# Patient Record
Sex: Female | Born: 1945 | Race: White | Hispanic: No | Marital: Married | Smoking: Former smoker
Health system: Southern US, Community
[De-identification: ages and names within clinical notes are randomized; demographics above are authoritative.]

## PROBLEM LIST (undated history)

## (undated) DIAGNOSIS — Z954 Presence of other heart-valve replacement: Secondary | ICD-10-CM

## (undated) DIAGNOSIS — I10 Essential (primary) hypertension: Secondary | ICD-10-CM

## (undated) DIAGNOSIS — M5430 Sciatica, unspecified side: Secondary | ICD-10-CM

## (undated) DIAGNOSIS — I4891 Unspecified atrial fibrillation: Secondary | ICD-10-CM

## (undated) DIAGNOSIS — R931 Abnormal findings on diagnostic imaging of heart and coronary circulation: Secondary | ICD-10-CM

## (undated) DIAGNOSIS — F329 Major depressive disorder, single episode, unspecified: Secondary | ICD-10-CM

## (undated) DIAGNOSIS — M549 Dorsalgia, unspecified: Secondary | ICD-10-CM

## (undated) DIAGNOSIS — E079 Disorder of thyroid, unspecified: Secondary | ICD-10-CM

## (undated) DIAGNOSIS — J45909 Unspecified asthma, uncomplicated: Secondary | ICD-10-CM

## (undated) DIAGNOSIS — J449 Chronic obstructive pulmonary disease, unspecified: Secondary | ICD-10-CM

## (undated) DIAGNOSIS — G8929 Other chronic pain: Secondary | ICD-10-CM

## (undated) DIAGNOSIS — I671 Cerebral aneurysm, nonruptured: Secondary | ICD-10-CM

## (undated) DIAGNOSIS — I639 Cerebral infarction, unspecified: Secondary | ICD-10-CM

## (undated) DIAGNOSIS — R42 Dizziness and giddiness: Secondary | ICD-10-CM

## (undated) DIAGNOSIS — E871 Hypo-osmolality and hyponatremia: Secondary | ICD-10-CM

## (undated) DIAGNOSIS — F32A Depression, unspecified: Secondary | ICD-10-CM

## (undated) HISTORY — PX: BRAIN SURGERY: SHX531

## (undated) HISTORY — PX: APPENDECTOMY: SHX54

## (undated) HISTORY — PX: CATARACT EXTRACTION: SUR2

## (undated) HISTORY — PX: CARDIAC SURGERY: SHX584

## (undated) HISTORY — DX: Depression, unspecified: F32.A

## (undated) HISTORY — DX: Major depressive disorder, single episode, unspecified: F32.9

## (undated) HISTORY — PX: CHOLECYSTECTOMY: SHX55

---

## 2013-10-20 DIAGNOSIS — I671 Cerebral aneurysm, nonruptured: Secondary | ICD-10-CM

## 2013-10-20 HISTORY — DX: Cerebral aneurysm, nonruptured: I67.1

## 2013-11-11 ENCOUNTER — Emergency Department (HOSPITAL_COMMUNITY)
Admission: EM | Admit: 2013-11-11 | Discharge: 2013-11-11 | Disposition: A | Discharge status: Home / Self Care | Payer: Medicare Other | Attending: Emergency Medicine | Admitting: Emergency Medicine

## 2013-11-11 ENCOUNTER — Emergency Department (HOSPITAL_COMMUNITY): Payer: Medicare Other

## 2013-11-11 ENCOUNTER — Encounter (HOSPITAL_COMMUNITY): Payer: Self-pay | Admitting: Emergency Medicine

## 2013-11-11 DIAGNOSIS — I10 Essential (primary) hypertension: Secondary | ICD-10-CM | POA: Insufficient documentation

## 2013-11-11 DIAGNOSIS — Z7952 Long term (current) use of systemic steroids: Secondary | ICD-10-CM | POA: Diagnosis not present

## 2013-11-11 DIAGNOSIS — Z7901 Long term (current) use of anticoagulants: Secondary | ICD-10-CM | POA: Insufficient documentation

## 2013-11-11 DIAGNOSIS — Z7982 Long term (current) use of aspirin: Secondary | ICD-10-CM | POA: Diagnosis not present

## 2013-11-11 DIAGNOSIS — Z9889 Other specified postprocedural states: Secondary | ICD-10-CM | POA: Insufficient documentation

## 2013-11-11 DIAGNOSIS — Z7951 Long term (current) use of inhaled steroids: Secondary | ICD-10-CM | POA: Diagnosis not present

## 2013-11-11 DIAGNOSIS — R5383 Other fatigue: Secondary | ICD-10-CM

## 2013-11-11 DIAGNOSIS — G8929 Other chronic pain: Secondary | ICD-10-CM | POA: Diagnosis not present

## 2013-11-11 DIAGNOSIS — Z7902 Long term (current) use of antithrombotics/antiplatelets: Secondary | ICD-10-CM | POA: Insufficient documentation

## 2013-11-11 DIAGNOSIS — Z792 Long term (current) use of antibiotics: Secondary | ICD-10-CM | POA: Insufficient documentation

## 2013-11-11 DIAGNOSIS — E871 Hypo-osmolality and hyponatremia: Secondary | ICD-10-CM | POA: Diagnosis present

## 2013-11-11 DIAGNOSIS — E079 Disorder of thyroid, unspecified: Secondary | ICD-10-CM | POA: Diagnosis not present

## 2013-11-11 DIAGNOSIS — R531 Weakness: Secondary | ICD-10-CM | POA: Insufficient documentation

## 2013-11-11 DIAGNOSIS — Z79899 Other long term (current) drug therapy: Secondary | ICD-10-CM | POA: Insufficient documentation

## 2013-11-11 DIAGNOSIS — E039 Hypothyroidism, unspecified: Secondary | ICD-10-CM

## 2013-11-11 DIAGNOSIS — J449 Chronic obstructive pulmonary disease, unspecified: Secondary | ICD-10-CM | POA: Diagnosis not present

## 2013-11-11 DIAGNOSIS — Z8739 Personal history of other diseases of the musculoskeletal system and connective tissue: Secondary | ICD-10-CM | POA: Diagnosis not present

## 2013-11-11 DIAGNOSIS — R791 Abnormal coagulation profile: Secondary | ICD-10-CM

## 2013-11-11 DIAGNOSIS — J410 Simple chronic bronchitis: Secondary | ICD-10-CM

## 2013-11-11 DIAGNOSIS — M549 Dorsalgia, unspecified: Secondary | ICD-10-CM

## 2013-11-11 DIAGNOSIS — Z720 Tobacco use: Secondary | ICD-10-CM | POA: Diagnosis not present

## 2013-11-11 DIAGNOSIS — I671 Cerebral aneurysm, nonruptured: Secondary | ICD-10-CM

## 2013-11-11 HISTORY — DX: Cerebral aneurysm, nonruptured: I67.1

## 2013-11-11 HISTORY — DX: Unspecified asthma, uncomplicated: J45.909

## 2013-11-11 HISTORY — DX: Disorder of thyroid, unspecified: E07.9

## 2013-11-11 HISTORY — DX: Chronic obstructive pulmonary disease, unspecified: J44.9

## 2013-11-11 HISTORY — DX: Sciatica, unspecified side: M54.30

## 2013-11-11 HISTORY — DX: Other chronic pain: G89.29

## 2013-11-11 HISTORY — DX: Dorsalgia, unspecified: M54.9

## 2013-11-11 HISTORY — DX: Essential (primary) hypertension: I10

## 2013-11-11 LAB — BASIC METABOLIC PANEL
Anion gap: 14 (ref 5–15)
BUN: 13 mg/dL (ref 6–23)
CO2: 27 mEq/L (ref 19–32)
CREATININE: 0.81 mg/dL (ref 0.50–1.10)
Calcium: 9.1 mg/dL (ref 8.4–10.5)
Chloride: 88 mEq/L — ABNORMAL LOW (ref 96–112)
GFR, EST AFRICAN AMERICAN: 85 mL/min — AB (ref >90)
GFR, EST NON AFRICAN AMERICAN: 73 mL/min — AB (ref >90)
Glucose, Bld: 142 mg/dL — ABNORMAL HIGH (ref 70–99)
POTASSIUM: 4.5 meq/L (ref 3.7–5.3)
Sodium: 129 mEq/L — ABNORMAL LOW (ref 137–147)

## 2013-11-11 LAB — CBC WITH DIFFERENTIAL/PLATELET
BASOS ABS: 0 10*3/uL (ref 0.0–0.1)
Basophils Relative: 0 % (ref 0–1)
Eosinophils Absolute: 0 10*3/uL (ref 0.0–0.7)
Eosinophils Relative: 0 % (ref 0–5)
HCT: 36.5 % (ref 36.0–46.0)
HEMOGLOBIN: 12.8 g/dL (ref 12.0–15.0)
Lymphocytes Relative: 7 % — ABNORMAL LOW (ref 12–46)
Lymphs Abs: 1.1 10*3/uL (ref 0.7–4.0)
MCH: 33.5 pg (ref 26.0–34.0)
MCHC: 35.1 g/dL (ref 30.0–36.0)
MCV: 95.5 fL (ref 78.0–100.0)
MONOS PCT: 4 % (ref 3–12)
Monocytes Absolute: 0.7 10*3/uL (ref 0.1–1.0)
Neutro Abs: 13.6 10*3/uL — ABNORMAL HIGH (ref 1.7–7.7)
Neutrophils Relative %: 89 % — ABNORMAL HIGH (ref 43–77)
Platelets: 351 10*3/uL (ref 150–400)
RBC: 3.82 MIL/uL — ABNORMAL LOW (ref 3.87–5.11)
RDW: 14.9 % (ref 11.5–15.5)
WBC: 15.4 10*3/uL — ABNORMAL HIGH (ref 4.0–10.5)

## 2013-11-11 LAB — PROTIME-INR
INR: 3.26 — ABNORMAL HIGH (ref 0.00–1.49)
Prothrombin Time: 33.5 seconds — ABNORMAL HIGH (ref 11.6–15.2)

## 2013-11-11 LAB — URINE MICROSCOPIC-ADD ON

## 2013-11-11 LAB — URINALYSIS, ROUTINE W REFLEX MICROSCOPIC
Bilirubin Urine: NEGATIVE
Glucose, UA: NEGATIVE mg/dL
Ketones, ur: NEGATIVE mg/dL
LEUKOCYTES UA: NEGATIVE
NITRITE: NEGATIVE
PH: 5.5 (ref 5.0–8.0)
Protein, ur: NEGATIVE mg/dL
Urobilinogen, UA: 0.2 mg/dL (ref 0.0–1.0)

## 2013-11-11 LAB — DIGOXIN LEVEL: Digoxin Level: 1.1 ng/mL (ref 0.8–2.0)

## 2013-11-11 NOTE — ED Provider Notes (Signed)
CSN: [id number]     Arrival date & time 11/11/13  1629 History   First MD Initiated Contact with Patient 11/11/13 1650     Chief Complaint  Patient presents with  . Abnormal Lab    NA 128      HPI Pt was seen at 1730.  Per pt, c/o gradual onset and persistence of constant "low sodium" for the past 2 to 3 weeks. Pt states she has been evaluated and treated by her Cards MD in [place] for same, "but nothing we're doing is getting my sodium up." Pt states she was called today by her Cards MD (Dr. Earna CoderZachary), told her sodium level was "128" and to go to [facility] and admission. Pt states the "wait was too long up there" so she came to this ED for evaluation. Pt's only complaint is generalized weakness/fatigue. Denies N/V/D, no CP/SOB, no abd pain, no headache, no focal motor weakness, no tingling/numbness in extremities.     Past Medical History  Diagnosis Date  . Brain aneurysm 10/20/13    3 on lt side and 1 on the right, most recent Oct 1  . Thyroid disease   . Hypertension   . COPD (chronic obstructive pulmonary disease)   . Asthma   . Chronic back pain   . Sciatica    Past Surgical History  Procedure Laterality Date  . Brain surgery    . Cardiac surgery    . Cholecystectomy    . Appendectomy      History  Substance Use Topics  . Smoking status: Heavy Tobacco Smoker  . Smokeless tobacco: Not on file  . Alcohol Use: Yes    Review of Systems ROS: Statement: All systems negative except as marked or noted in the HPI; Constitutional: Negative for fever and chills. +generalized weakness/fatigue; ; Eyes: Negative for eye pain, redness and discharge. ; ; ENMT: Negative for ear pain, hoarseness, nasal congestion, sinus pressure and sore throat. ; ; Cardiovascular: Negative for chest pain, palpitations, diaphoresis, dyspnea and peripheral edema. ; ; Respiratory: Negative for cough, wheezing and stridor. ; ; Gastrointestinal: Negative for nausea, vomiting,  diarrhea, abdominal pain, blood in stool, hematemesis, jaundice and rectal bleeding. . ; ; Genitourinary: Negative for dysuria, flank pain and hematuria. ; ; Musculoskeletal: Negative for back pain and neck pain. Negative for swelling and trauma.; ; Skin: Negative for pruritus, rash, abrasions, blisters, bruising and skin lesion.; ; Neuro: Negative for headache, lightheadedness and neck stiffness. Negative for altered level of consciousness , altered mental status, extremity weakness, paresthesias, involuntary movement, seizure and syncope.      Allergies  Sulfa antibiotics  Home Medications   Prior to Admission medications   Medication Sig Start Date End Date Taking? Authorizing Provider  Aclidinium Bromide (TUDORZA PRESSAIR) 400 MCG/ACT AEPB Inhale 1 puff into the lungs 2 (two) times daily.   Yes Historical Provider, MD  albuterol (PROVENTIL) (2.5 MG/3ML) 0.083% nebulizer solution Take 2.5 mg by nebulization 2 (two) times daily.   Yes Historical Provider, MD  albuterol (VENTOLIN HFA) 108 (90 BASE) MCG/ACT inhaler Inhale 2 puffs into the lungs 4 (four) times daily.   Yes Historical Provider, MD  aspirin EC 81 MG tablet Take 81 mg by mouth every morning.   Yes Historical Provider, MD  chlorpheniramine-HYDROcodone (TUSSIONEX) 10-8 MG/5ML LQCR Take 5 mLs by mouth at bedtime as needed for cough.   Yes Historical Provider, MD  clopidogrel (PLAVIX) 75 MG tablet Take 37.5 mg by mouth every morning.  Yes Historical Provider, MD  cyclobenzaprine (FLEXERIL) 10 MG tablet Take 5 mg by mouth at bedtime.   Yes Historical Provider, MD  digoxin (LANOXIN) 0.25 MG tablet Take 0.25 mg by mouth every morning.   Yes Historical Provider, MD  fluticasone (FLONASE) 50 MCG/ACT nasal spray Place 2 sprays into both nostrils every morning.   Yes Historical Provider, MD  HYDROcodone-acetaminophen (NORCO) 7.5-325 MG per tablet Take 1 tablet by mouth 3 (three) times daily as needed for moderate pain.   Yes Historical  Provider, MD  levothyroxine (SYNTHROID, LEVOTHROID) 50 MCG tablet Take 50 mcg by mouth every morning.   Yes Historical Provider, MD  losartan (COZAAR) 50 MG tablet Take 50 mg by mouth every evening.   Yes Historical Provider, MD  metoprolol tartrate (LOPRESSOR) 25 MG tablet Take 12.5 mg by mouth every evening.   Yes Historical Provider, MD  montelukast (SINGULAIR) 10 MG tablet Take 10 mg by mouth every morning.   Yes Historical Provider, MD  pantoprazole (PROTONIX) 40 MG tablet Take 40 mg by mouth every morning.   Yes Historical Provider, MD  predniSONE (DELTASONE) 10 MG tablet Take 10 mg by mouth daily. For 7 days starting on 11/10/2013   Yes Historical Provider, MD  promethazine (PHENERGAN) 12.5 MG tablet Take 12.5 mg by mouth every 6 (six) hours as needed for nausea or vomiting.   Yes Historical Provider, MD  warfarin (COUMADIN) 7.5 MG tablet Take 7.5 mg by mouth every evening.   Yes Historical Provider, MD  amoxicillin (AMOXIL) 875 MG tablet Take 875 mg by mouth 2 (two) times daily.    Historical Provider, MD   BP 167/64  Pulse 104  Temp(Src) 98.8 F (37.1 C) (Oral)  Resp 20  Ht 5\' 6"  (1.676 m)  Wt 132 lb (59.875 kg)  BMI 21.32 kg/m2  SpO2 94% Physical Exam 1735: Physical examination:  Nursing notes reviewed; Vital signs and O2 SAT reviewed;  Constitutional: Well developed, Well nourished, Well hydrated, In no acute distress; Head:  Normocephalic, atraumatic; Eyes: EOMI, PERRL, No scleral icterus; ENMT: Mouth and pharynx normal, Mucous membranes moist; Neck: Supple, Full range of motion, No lymphadenopathy; Cardiovascular: Regular rate and rhythm, No gallop; Respiratory: Breath sounds clear & equal bilaterally, No rales, rhonchi, wheezes.  Speaking full sentences with ease, Normal respiratory effort/excursion; Chest: Nontender, Movement normal; Abdomen: Soft, Nontender, Nondistended, Normal bowel sounds; Genitourinary: No CVA tenderness; Extremities: Pulses normal, No tenderness, No edema,  No calf edema or asymmetry.; Neuro: AA&Ox3, Major CN grossly intact.  Speech clear. No gross focal motor or sensory deficits in extremities. Climbs on and off stretcher easily by herself. Gait steady.; Skin: Color normal, Warm, Dry.; Psych:  Anxious, rapid/pressured speech.   ED Course  Procedures     EKG Interpretation None      MDM  MDM Reviewed: nursing note and vitals Interpretation: labs and CT scan    Results for orders placed during the hospital encounter of 11/11/13  CBC WITH DIFFERENTIAL      Result Value Ref Range   WBC 15.4 (*) 4.0 - 10.5 K/uL   RBC 3.82 (*) 3.87 - 5.11 MIL/uL   Hemoglobin 12.8  12.0 - 15.0 g/dL   HCT 16.136.5  09.636.0 - 04.546.0 %   MCV 95.5  78.0 - 100.0 fL   MCH 33.5  26.0 - 34.0 pg   MCHC 35.1  30.0 - 36.0 g/dL   RDW 40.914.9  81.111.5 - 91.415.5 %   Platelets 351  150 - 400 K/uL  Neutrophils Relative % 89 (*) 43 - 77 %   Neutro Abs 13.6 (*) 1.7 - 7.7 K/uL   Lymphocytes Relative 7 (*) 12 - 46 %   Lymphs Abs 1.1  0.7 - 4.0 K/uL   Monocytes Relative 4  3 - 12 %   Monocytes Absolute 0.7  0.1 - 1.0 K/uL   Eosinophils Relative 0  0 - 5 %   Eosinophils Absolute 0.0  0.0 - 0.7 K/uL   Basophils Relative 0  0 - 1 %   Basophils Absolute 0.0  0.0 - 0.1 K/uL  BASIC METABOLIC PANEL      Result Value Ref Range   Sodium 129 (*) 137 - 147 mEq/L   Potassium 4.5  3.7 - 5.3 mEq/L   Chloride 88 (*) 96 - 112 mEq/L   CO2 27  19 - 32 mEq/L   Glucose, Bld 142 (*) 70 - 99 mg/dL   BUN 13  6 - 23 mg/dL   Creatinine, Ser 2.95  0.50 - 1.10 mg/dL   Calcium 9.1  8.4 - 62.1 mg/dL   GFR calc non Af Amer 73 (*) >90 mL/min   GFR calc Af Amer 85 (*) >90 mL/min   Anion gap 14  5 - 15  URINALYSIS, ROUTINE W REFLEX MICROSCOPIC      Result Value Ref Range   Color, Urine YELLOW  YELLOW   APPearance CLEAR  CLEAR   Specific Gravity, Urine <1.005 (*) 1.005 - 1.030   pH 5.5  5.0 - 8.0   Glucose, UA NEGATIVE  NEGATIVE mg/dL   Hgb urine dipstick TRACE (*) NEGATIVE   Bilirubin Urine NEGATIVE   NEGATIVE   Ketones, ur NEGATIVE  NEGATIVE mg/dL   Protein, ur NEGATIVE  NEGATIVE mg/dL   Urobilinogen, UA 0.2  0.0 - 1.0 mg/dL   Nitrite NEGATIVE  NEGATIVE   Leukocytes, UA NEGATIVE  NEGATIVE  URINE MICROSCOPIC-ADD ON      Result Value Ref Range   Squamous Epithelial / LPF FEW (*) RARE   WBC, UA 3-6  <3 WBC/hpf   RBC / HPF 0-2  <3 RBC/hpf   Bacteria, UA FEW (*) RARE  DIGOXIN LEVEL      Result Value Ref Range   Digoxin Level 1.1  0.8 - 2.0 ng/mL  PROTIME-INR      Result Value Ref Range   Prothrombin Time 33.5 (*) 11.6 - 15.2 seconds   INR 3.26 (*) 0.00 - 1.49   Dg Chest 2 View 11/11/2013   CLINICAL DATA:  Shortness of breath, cough, congestion. Recent history of bronchitis. Smoker. Hyponatremia.  EXAM: CHEST  2 VIEW  COMPARISON:  None.  FINDINGS: Prior median sternotomy. Cardiomegaly. Mediastinal contours are otherwise within normal limits. No effusions, confluent airspace opacities or edema. No acute bony abnormality appear  IMPRESSION: Cardiomegaly.  No active disease.   Electronically Signed   By: Charlett Nose M.D.   On: 11/11/2013 18:46   Ct Head Wo Contrast 11/11/2013   CLINICAL DATA:  Patient has a low sodium and thyroid issues. History of aneurysms. Patient instructed by local physician to go to the emergency room for her hyponatremia.  EXAM: CT HEAD WITHOUT CONTRAST  TECHNIQUE: Contiguous axial images were obtained from the base of the skull through the vertex without intravenous contrast.  COMPARISON:  None.  FINDINGS: Ventricles are normal in size and configuration. There are no parenchymal masses or mass effect. There is a small area of hypoattenuation involving the cortex and subcortical white matter  of the posterior right frontal lobe consistent with an old infarct. There is an area of fluid attenuation along the superior margin of the left cerebellum also likely an old infarct. There is no evidence of a recent infarct.  There are no extra-axial masses or abnormal fluid  collections.  There is no intracranial hemorrhage.  Dense calcification is noted along the intracranial internal carotid arteries vertically along the carotid siphons. Visualized sinuses and mastoid air cells are clear.  IMPRESSION: 1. No acute intracranial abnormalities. 2. 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 No intracranial hemorrhage.   Electronically Signed   By: Amie Portland M.D.   On: 11/11/2013 18:55    1925:  Hyponatremia, no old to compare. Leukocytosis likely due to prednisone, as UA and CXR are without acute infection. Pt remains neurologically intact, ambulates with steady gait, VS remain stable. Dx and testing d/w pt and family.  Questions answered.  Verb understanding.  T/C to [facility]. Sharl Ma, case discussed, including:  HPI, pertinent PM/SHx, VS/PE, dx testing, ED course and treatment:  Requests he will come to the ED for further evaluation.   2030:  [facility] Dr. Sharl Ma has evaluated pt: states pt does not meet inpt criteria at this time and can be discharged home to f/u with PMD next week; he believes pt's fatigue and hyponatremia are multifactorial including: multiple sedating medications and recent brain surgery; requests to d/c pt with instructions to d/c Tussionex, taper prednisone over the next 2 days, limit fluid intake to 1245ml/day, and pt will also need to hold her coumadin dose tonight. Pt and family want to go home now. Dx and testing d/w pt and family.  Questions answered.  Verb understanding, agreeable to d/c home with outpt f/u.   Samuel Jester, DO 11/14/13 1316

## 2013-11-11 NOTE — ED Notes (Signed)
Pt ambulated independently to the bathroom with no difficulty. Nad noted at present.

## 2013-11-11 NOTE — Discharge Instructions (Signed)
°Emergency Department Resource Guide °1) Find a Doctor and Pay Out of Pocket °Although you won't have to find out who is covered by your insurance plan, it is a good idea to ask around and get recommendations. You will then need to call the office and see if the doctor you have chosen will accept you as a new patient and what types of options they offer for patients who are self-pay. Some doctors offer discounts or will set up payment plans for their patients who do not have insurance, but you will need to ask so you aren't surprised when you get to your appointment. ° °2) Contact Your Local Health Department °Not all health departments have doctors that can see patients for sick visits, but many do, so it is worth a call to see if yours does. If you don't know where your local health department is, you can check in your phone book. The CDC also has a tool to help you locate your state's health department, and many state websites also have listings of all of their local health departments. ° °3) Find a Walk-in Clinic °If your illness is not likely to be very severe or complicated, you may want to try a walk in clinic. These are popping up all over the country in pharmacies, drugstores, and shopping centers. They're usually staffed by nurse practitioners or physician assistants that have been trained to treat common illnesses and complaints. They're usually fairly quick and inexpensive. However, if you have serious medical issues or chronic medical problems, these are probably not your best option. ° °No Primary Care Doctor: °- Call [facility] at  832-8000 - they can help you locate a primary care doctor that  accepts your insurance, provides certain services, etc. °- Physician Referral Service- 1-800-533-3463 ° °Chronic Pain Problems: °Organization         Address  Phone   Notes  °[facility]  (336) 297-2271 Patients need to be referred by their primary care doctor.  ° °Medication  Assistance: °Organization         Address  Phone   Notes  °[place] [facility] [address] °[place], [place] [address] (336) 641-8030 --Must be a resident of [place] °-- Must have NO insurance coverage whatsoever (no Medicaid/ Medicare, etc.) °-- The pt. MUST have a primary care doctor that directs their care regularly and follows them in the community °  °[facility]  (866) 331-1348   °[employer]  (888) 892-1162   ° °Agencies that provide inexpensive medical care: °Organization         Address  Phone   Notes  °[facility]  (336) 832-8035   °[facility]    (336) 832-7272   °[facility] [address] °[place], [place] [address] (336) 832-4777   °[facility] [address], °[place] (336) 271-4999   °[facility]    (336) 373-0678   °[facility]    (336) 272-1050   °[facility] ° [address], [place] Phone:  (336) 832-4444, Fax:  (336) 832-4440 Hours of Operation:  9 am - 6 pm, M-F.  Also accepts Medicaid/Medicare and self-pay.  °[facility] ° [address], [place] Phone: (336) 832-3150, Fax: (336) [phone]. Hours of Operation:  8:30 am - 5:30 pm, M-F.  Also accepts Medicaid and self-pay.  °[facility] [place] [address], [place] Phone: (336) 878-6027   °[facility] [address], [place], [place] (336)723-1848, Ext. 123 Mondays & Thursdays: 7-9 AM.  First 15 patients are seen on a first come, first serve basis. °  ° °Medicaid-accepting [place] Providers: ° °Organization         Address  Phone   Notes  °[facility] [address], [place] (336) 641-2100 Also accepts self-pay patients.  °[facility] [address], [place] ° (336) 856-9996   °[facility] [address], [place]  (336) 288-8857   °[facility] [address] [place] [address], [place] (336) 299-7000   °Veita Bland [address], [place]  ° (336) 373-1557 Only accepts [place] Access Medicaid patients after they have their name applied to their card.  ° °Self-Pay (no insurance) in [place]: ° °Organization         Address  Phone   Notes  °Sickle Cell Patients, [facility] [address], [place] (336) 832-1970   °[facility] [address], [place] (336) 832-4400   °[facility] ° [address], [place] (336) 992-4800   °[facility]/Dr. Osei-Bonsu ° [address] [place] [address], [place] or [address], [place] (336) 841-8500 Phone number for both [place] and [place] locations is the same.  °[facility] [address], [place] (336) 299-0000   °[facility] [address] [place] [address], [place] or [address] (336) 852-7530 °(336) 878-2260   °[facility] [address], [place] (336) 350-1642, phone; (336) 294-5005, fax Sees patients 1st and 3rd Saturday of every month.  Must not qualify for public or private insurance (i.e. Medicaid, Medicare, [place] Health Choice, Veterans' Benefits) • Household income should be no more than 200% of the poverty level •The clinic cannot treat you if you are pregnant or think you are pregnant • Sexually transmitted diseases are not treated at the clinic.  ° ° °Dental Care: °Organization         Address  Phone  Notes  °[place] [facility] [address], [place] (336) 641-6152 Accepts children up to age 21 who are enrolled in Medicaid or [place] Health Choice; pregnant women with a Medicaid card; and children who have applied for Medicaid or [place] Health Choice, but were declined, whose parents can pay a reduced fee at time of service.  °[place]  [facility] [place]  [address], [place] (336) 641-7733 Accepts children up to age 21 who are enrolled in Medicaid or [place] Health Choice; pregnant women with a Medicaid card; and children who have applied for Medicaid or [place] Health Choice, but were declined, whose parents can pay a reduced fee at time of service.  °[facility] ° [address], [place] (336) 641-4533 Patients are seen by appointment only. Walk-ins are not accepted. [facility] will see patients 18 years of age and older. °Monday - Tuesday (8am-5pm) °Most Wednesdays (8:30-5pm) °$30 per visit, cash only  °[facility] ° [address], [place] (336) 641-4533 Patients are seen by appointment only. Walk-ins are not accepted. [facility] will see patients 18 years of age and older. °One   Wednesday Evening (Monthly: Volunteer Based).  $30 per visit, cash only  °[facility]  (919) 537-3737 for adults; Children under age 4, call Graduate Pediatric Dentistry at ([phone]) [phone]. Children aged 4-14, please call ([phone]) [phone] to request a pediatric application. ° Dental services are provided in all areas of dental care including fillings, crowns and bridges, complete and partial dentures, implants, gum treatment, root canals, and extractions. Preventive care is also provided. Treatment is provided to both adults and children. °Patients are selected via a lottery and there is often a waiting list. °  °[facility] [address], °[place] ° (336) 763-8833 [email or web address] °  °[facility] [address], [place], [place] (336)723-1848, Ext. 123 Second and Fourth Thursday of each month, opens at 6:30 AM; Clinic ends at 9 AM.  Patients are seen on a first-come first-served basis, and a limited number are seen during each clinic.  ° °[facility] ° [address], [place], [place] (336) 723-7904    Eligibility Requirements °You must have lived in [place], [place], or [place] counties for at least the last three months. °  You cannot be eligible for state or federal sponsored healthcare insurance, including Veterans Administration, Medicaid, or Medicare. °  You generally cannot be eligible for healthcare insurance through your employer.  °  How to apply: °Eligibility screenings are held every Tuesday and Wednesday afternoon from 1:00 pm until 4:00 pm. You do not need an appointment for the interview!  °[facility] [address], [place], [place] 336-631-2330   °[facility]  336-342-8273   °[facility]  336-703-3100   °[facility]  336-570-6415   ° °Behavioral Health Resources in the Community: °Intensive Outpatient Programs °Organization         Address  Phone  Notes  °[facility] [address], [place], [place] 336-878-6098   °[facility] [address], [place], [place] 336-832-9800   °[facility] [address], [place], [place] ° 336-882-2125   °[facility] [address],  °[place], [place] 1-800-853-5163 or 336-641-4981   °Substance Abuse Resources °Organization         Address  Phone  Notes  °[facility]  336-882-2125   °[facility]  336-784-9470   °[facility]  336-285-9073   °[facility]  336-845-3988   °Residential & Outpatient Substance Abuse Program  1-800-659-3381   °Psychological Services °Organization         Address  Phone  Notes  °[facility]  336- 832-9600   °[facility]  336- 378-7881   °[facility] [address], [place] 1-800-853-5163 or 336-641-4981   ° °Mobile Crisis Teams °Organization         Address  Phone  Notes  °[facility], Mobile Crisis Care Unit  1-877-626-1772   °Assertive °[facility] ° [address]  [place], [place] 336-834-9664   °Sharon DeEsch [address] °[place] 336-554-5454   ° °Self-Help/Support Groups °Organization         Address  Phone             Notes  °[facility]  - variety of support groups  336- 373-1402 Call for more information  °Narcotics Anonymous (NA), [facility] [address], °[place]  2 meetings at this location  ° °  Residential Treatment Programs Organization         Address  Phone  Notes  [facility] [address],    [place]  [phone]   [facility]  [address], [place] [phone], [place], [place] [phone]   [facility] [address], [place] [phone] Admissions: 8am-3pm M-F  [facility] [address]    [place], [place] [phone]   [facility] [address], [place], [place] [phone]   [facility] [address]  [place], [place] [phone]   [facility] Intensive Outpatient [address], [place], [place] [phone]   [facility] ([facility] [address]  [place], [place] [phone] or [phone]   [facility] ([facility]) [address] [place], [place] [phone] Accepts Medicaid  [facility] [address]  [place] [phone] Substance Abuse/Addiction Treatment   [facility]         Address  Phone  Notes  [facility]  (614)359-4149(888) 580-598-6166   Angie FavaJulie Brannon, PhD [address] [place], [place]   ([phone](336) (786) 875-7588 or [phone](336) [phone]   [facility]   [address] [place], [place] ([phone]) [phone](336) [phone]   [facility] [address], [place], [place] [phone](336) [phone] Insurance/Medicaid/sponsorship through [facility] [address]                                    [place], [place] ([phone](336) [phone] Therapy/tele-psych/case    [facility] [address]   [place], [place] [phone](336) [phone]    Dr. Lolly MustacheArfeen  [phone](336) [phone]   [facility]  [employer] [facility] 1) [address], [place] 2) [address], [place] 3)  [address], [place] ([phone](336) [phone](336) ([phone]  ([phone](336) ([phone]   [facility] 548-052-2647(336) 9106082472 or [phone](336) 7732372050 (After Hours)       [facility] recommends: limit your fluid intake to 1200ml per day for the next several days; taper your prednisone to 5mg  over the next 2 days then stop; hold your coumadin dose tonight; stop taking your Tussionex. Continue to take your synthroid. Keep your follow up appointment with your Cardiologist as previously scheduled on Tuesday. Return to the Emergency Department immediately sooner if worsening.

## 2013-11-11 NOTE — H&P (Signed)
PCP:   Elise BenneHenderson, William W, MD   Chief Complaint:  Generalized weakness  HPI: 68 year old female who   has a past medical history of Brain aneurysm (10/20/13); Thyroid disease; Hypertension; COPD (chronic obstructive pulmonary disease); Asthma; Chronic back pain; and Sciatica. Today was sent to the ED by her cardiologist for low sodium, as patient has been feeling generally tired over the past 2 weeks. As per patient she underwent surgery for cerebral aneurysm on October 1, and also had COPD exacerbation she was prescribed Tussionex, prednisone taper which she has been taking for past 15 days. Patient has noticed that she was very groggy after she took Tussionex, so she cut down the dose but she was taking it everyday. Patient also takes clonazepam twice a day for anxiety, Flexeril 5 mg at bedtime, Phenergan when necessary for nausea vomiting, and Vicodin for chronic back pain. Patient's dose of Synthroid was recently increased to 50 mcg by mouth daily by her cardiologist as her TSH was elevated.  In the ED patient was found to have white count of 15,000, she denies fever, she recently finished her course of Augmentin for the acute bronchitis. She denies shortness of breath. Her sodium level was 129 with chloride 88. She denies nausea vomiting diarrhea, no chest pain or shortness of breath.  Allergies:   Allergies  Allergen Reactions  . Sulfa Antibiotics Photosensitivity      Past Medical History  Diagnosis Date  . Brain aneurysm 10/20/13    3 on lt side and 1 on the right, most recent Oct 1  . Thyroid disease   . Hypertension   . COPD (chronic obstructive pulmonary disease)   . Asthma   . Chronic back pain   . Sciatica     Past Surgical History  Procedure Laterality Date  . Brain surgery    . Cardiac surgery    . Cholecystectomy    . Appendectomy      Prior to Admission medications   Medication Sig Start Date End Date Taking? Authorizing Provider  Aclidinium Bromide  (TUDORZA PRESSAIR) 400 MCG/ACT AEPB Inhale 1 puff into the lungs 2 (two) times daily.   Yes Historical Provider, MD  albuterol (PROVENTIL) (2.5 MG/3ML) 0.083% nebulizer solution Take 2.5 mg by nebulization 2 (two) times daily.   Yes Historical Provider, MD  albuterol (VENTOLIN HFA) 108 (90 BASE) MCG/ACT inhaler Inhale 2 puffs into the lungs 4 (four) times daily.   Yes Historical Provider, MD  aspirin EC 81 MG tablet Take 81 mg by mouth every morning.   Yes Historical Provider, MD  chlorpheniramine-HYDROcodone (TUSSIONEX) 10-8 MG/5ML LQCR Take 5 mLs by mouth at bedtime as needed for cough.   Yes Historical Provider, MD  clopidogrel (PLAVIX) 75 MG tablet Take 37.5 mg by mouth every morning.   Yes Historical Provider, MD  cyclobenzaprine (FLEXERIL) 10 MG tablet Take 5 mg by mouth at bedtime.   Yes Historical Provider, MD  digoxin (LANOXIN) 0.25 MG tablet Take 0.25 mg by mouth every morning.   Yes Historical Provider, MD  fluticasone (FLONASE) 50 MCG/ACT nasal spray Place 2 sprays into both nostrils every morning.   Yes Historical Provider, MD  HYDROcodone-acetaminophen (NORCO) 7.5-325 MG per tablet Take 1 tablet by mouth 3 (three) times daily as needed for moderate pain.   Yes Historical Provider, MD  levothyroxine (SYNTHROID, LEVOTHROID) 50 MCG tablet Take 50 mcg by mouth every morning.   Yes Historical Provider, MD  losartan (COZAAR) 50 MG tablet Take 50 mg by  mouth every evening.   Yes Historical Provider, MD  metoprolol tartrate (LOPRESSOR) 25 MG tablet Take 12.5 mg by mouth every evening.   Yes Historical Provider, MD  montelukast (SINGULAIR) 10 MG tablet Take 10 mg by mouth every morning.   Yes Historical Provider, MD  pantoprazole (PROTONIX) 40 MG tablet Take 40 mg by mouth every morning.   Yes Historical Provider, MD  predniSONE (DELTASONE) 10 MG tablet Take 10 mg by mouth daily. For 7 days starting on 11/10/2013   Yes Historical Provider, MD  promethazine (PHENERGAN) 12.5 MG tablet Take 12.5 mg  by mouth every 6 (six) hours as needed for nausea or vomiting.   Yes Historical Provider, MD  warfarin (COUMADIN) 7.5 MG tablet Take 7.5 mg by mouth every evening.   Yes Historical Provider, MD  amoxicillin (AMOXIL) 875 MG tablet Take 875 mg by mouth 2 (two) times daily.    Historical Provider, MD    Social History:  reports that she has been smoking.  She does not have any smokeless tobacco history on file. She reports that she drinks alcohol. She reports that she does not use illicit drugs.  History reviewed. No pertinent family history.  All the positives are listed in BOLD  Review of Systems:  HEENT: Headache, blurred vision, runny nose, sore throat Neck: Hypothyroidism, hyperthyroidism,,lymphadenopathy Chest : Shortness of breath, history of COPD, Asthma Heart : Chest pain, history of coronary arterey disease GI:  Nausea, vomiting, diarrhea, constipation, GERD GU: Dysuria, urgency, frequency of urination, hematuria Neuro: Stroke, seizures, syncope Psych: Depression, anxiety, hallucinations   Physical Exam: Blood pressure 125/49, pulse 64, temperature 98.8 F (37.1 C), temperature source Oral, resp. rate 18, height 5\' 6"  (1.676 m), weight 59.875 kg (132 lb), SpO2 99.00%. Constitutional:   Patient is a well-developed and well-nourished female* in no acute distress and cooperative with exam. Head: Normocephalic and atraumatic Mouth: Mucus membranes moist Eyes: PERRL, EOMI, conjunctivae normal Neck: Supple, No Thyromegaly Cardiovascular: RRR, S1 normal, S2 normal Pulmonary/Chest: CTAB, no wheezes, rales, or rhonchi Abdominal: Soft. Non-tender, non-distended, bowel sounds are normal, no masses, organomegaly, or guarding present.  Neurological: A&O x3, Strenght is normal and symmetric bilaterally, cranial nerve II-XII are grossly intact, no focal motor deficit, sensory intact to light touch bilaterally.  Extremities : No Cyanosis, Clubbing or Edema  Labs on Admission:  Basic  Metabolic Panel:  Recent Labs Lab 11/11/13 1703  NA 129*  K 4.5  CL 88*  CO2 27  GLUCOSE 142*  BUN 13  CREATININE 0.81  CALCIUM 9.1   Liver Function Tests: No results found for this basename: AST, ALT, ALKPHOS, BILITOT, PROT, ALBUMIN,  in the last 168 hours No results found for this basename: LIPASE, AMYLASE,  in the last 168 hours No results found for this basename: AMMONIA,  in the last 168 hours CBC:  Recent Labs Lab 11/11/13 1703  WBC 15.4*  NEUTROABS 13.6*  HGB 12.8  HCT 36.5  MCV 95.5  PLT 351   Cardiac Enzymes: No results found for this basename: CKTOTAL, CKMB, CKMBINDEX, TROPONINI,  in the last 168 hours  BNP (last 3 results) No results found for this basename: PROBNP,  in the last 8760 hours CBG: No results found for this basename: GLUCAP,  in the last 168 hours  Radiological Exams on Admission: Dg Chest 2 View  11/11/2013   CLINICAL DATA:  Shortness of breath, cough, congestion. Recent history of bronchitis. Smoker. Hyponatremia.  EXAM: CHEST  2 VIEW  COMPARISON:  None.  FINDINGS: Prior median sternotomy. Cardiomegaly. Mediastinal contours are otherwise within normal limits. No effusions, confluent airspace opacities or edema. No acute bony abnormality appear  IMPRESSION: Cardiomegaly.  No active disease.   Electronically Signed   By: Charlett Nose M.D.   On: 11/11/2013 18:46   Ct Head Wo Contrast  11/11/2013   CLINICAL DATA:  Patient has a low sodium and thyroid issues. History of aneurysms. Patient instructed by local physician to go to the emergency room for her hyponatremia.  EXAM: CT HEAD WITHOUT CONTRAST  TECHNIQUE: Contiguous axial images were obtained from the base of the skull through the vertex without intravenous contrast.  COMPARISON:  None.  FINDINGS: Ventricles are normal in size and configuration. There are no parenchymal masses or mass effect. There is a small area of hypoattenuation involving the cortex and subcortical white matter of the  posterior right frontal lobe consistent with an old infarct. There is an area of fluid attenuation along the superior margin of the left cerebellum also likely an old infarct. There is no evidence of a recent infarct.  There are no extra-axial masses or abnormal fluid collections.  There is no intracranial hemorrhage.  Dense calcification is noted along the intracranial internal carotid arteries vertically along the carotid siphons. Visualized sinuses and mastoid air cells are clear.  IMPRESSION: 1. No acute intracranial abnormalities. 2. 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 No intracranial hemorrhage.   Electronically Signed   By: Amie Portland M.D.   On: 11/11/2013 18:55      Assessment/Plan Active Problems:   Hyponatremia   Hypothyroidism   Chronic back pain   COPD (chronic obstructive pulmonary disease)   Aneurysm, cerebral  Generalized weakness Multifactorial, patient is on multiple medications which can make her drowsy including Tussionex, Vicodin, Flexeril, clonazepam, promethazine. I have recommended to discontinue Tussionex, and take clonazepam as needed for anxiety.  Hyponatremia Multifactorial, very mild hyponatremia with sodium 129, I do not think that hyponatremia is causing the weakness at this time. I recommended the patient to complete the course of prednisone which she has been taking for past 15 days over next 2 days. And get repeat labs done on Tuesday when she is going to follow up with her PCP cardiologist. 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 Recommend to limit fluid intake not more than 1200 mL per day  COPD Patient is not in exacerbation at this time, she has been taking prednisone for past 15 days. Recommend to taper prednisone over next 2 days. Currently she is taking 10 mg daily, I have recommended to take 5 mg over the next 2 days and then stop.   Leukocytosis Likely due to prednisone, she has been taking  prednisone for past 15 days. She has been afebrile no other symptoms of infection. Chest x-ray is clear shows no pneumonia, UA is clear.  Patient can be discharged home, with recommendation to limit fluid intake to 1200 mL per day, taper of prednisone over next 2 days, discontinue Tussionex. Continue taking Synthroid, patient is supposed to follow up with her PCP cardiologist on Tuesday. She should have a repeat BMP done at that time.    Family discussion: Discussed with patient's husband at bedside   Time Spent on Admission: 60  min  [facility] Pager: [phone] 11/11/2013, 8:00 PM  If 7PM-7AM, please contact night-coverage  [email or web address]  Password [email or web address]

## 2013-11-11 NOTE — ED Notes (Signed)
Pt states she was called by Dr. Earna CoderZachary in [place], instructed to go the ER for low sodium (128), and thyroid issues. Pt also has HX of aneurisms.

## 2013-11-12 LAB — TSH: TSH: 1.73 u[IU]/mL (ref 0.350–4.500)

## 2013-11-13 LAB — URINE CULTURE

## 2014-05-13 ENCOUNTER — Encounter (HOSPITAL_COMMUNITY): Payer: Self-pay | Admitting: Emergency Medicine

## 2014-05-13 ENCOUNTER — Observation Stay (HOSPITAL_COMMUNITY)
Admission: EM | Admit: 2014-05-13 | Discharge: 2014-05-13 | Disposition: A | Discharge status: Home / Self Care | Payer: Medicare Other | Attending: Emergency Medicine | Admitting: Emergency Medicine

## 2014-05-13 ENCOUNTER — Ambulatory Visit (HOSPITAL_COMMUNITY): Payer: Medicare Other

## 2014-05-13 ENCOUNTER — Observation Stay (HOSPITAL_COMMUNITY): Payer: Medicare Other

## 2014-05-13 DIAGNOSIS — K922 Gastrointestinal hemorrhage, unspecified: Secondary | ICD-10-CM | POA: Diagnosis not present

## 2014-05-13 DIAGNOSIS — M549 Dorsalgia, unspecified: Secondary | ICD-10-CM | POA: Insufficient documentation

## 2014-05-13 DIAGNOSIS — R7989 Other specified abnormal findings of blood chemistry: Secondary | ICD-10-CM

## 2014-05-13 DIAGNOSIS — K625 Hemorrhage of anus and rectum: Secondary | ICD-10-CM | POA: Diagnosis present

## 2014-05-13 DIAGNOSIS — Z720 Tobacco use: Secondary | ICD-10-CM

## 2014-05-13 DIAGNOSIS — G8929 Other chronic pain: Secondary | ICD-10-CM | POA: Insufficient documentation

## 2014-05-13 DIAGNOSIS — J189 Pneumonia, unspecified organism: Secondary | ICD-10-CM

## 2014-05-13 DIAGNOSIS — Z7901 Long term (current) use of anticoagulants: Secondary | ICD-10-CM | POA: Insufficient documentation

## 2014-05-13 DIAGNOSIS — M5430 Sciatica, unspecified side: Secondary | ICD-10-CM | POA: Insufficient documentation

## 2014-05-13 DIAGNOSIS — J449 Chronic obstructive pulmonary disease, unspecified: Secondary | ICD-10-CM | POA: Diagnosis not present

## 2014-05-13 DIAGNOSIS — K921 Melena: Secondary | ICD-10-CM | POA: Diagnosis not present

## 2014-05-13 DIAGNOSIS — K219 Gastro-esophageal reflux disease without esophagitis: Secondary | ICD-10-CM | POA: Diagnosis not present

## 2014-05-13 DIAGNOSIS — I671 Cerebral aneurysm, nonruptured: Secondary | ICD-10-CM | POA: Insufficient documentation

## 2014-05-13 DIAGNOSIS — I10 Essential (primary) hypertension: Secondary | ICD-10-CM | POA: Insufficient documentation

## 2014-05-13 DIAGNOSIS — Z7982 Long term (current) use of aspirin: Secondary | ICD-10-CM | POA: Diagnosis not present

## 2014-05-13 DIAGNOSIS — R778 Other specified abnormalities of plasma proteins: Secondary | ICD-10-CM

## 2014-05-13 DIAGNOSIS — Z79899 Other long term (current) drug therapy: Secondary | ICD-10-CM | POA: Insufficient documentation

## 2014-05-13 DIAGNOSIS — E079 Disorder of thyroid, unspecified: Secondary | ICD-10-CM | POA: Insufficient documentation

## 2014-05-13 DIAGNOSIS — Z882 Allergy status to sulfonamides status: Secondary | ICD-10-CM | POA: Insufficient documentation

## 2014-05-13 DIAGNOSIS — D72829 Elevated white blood cell count, unspecified: Secondary | ICD-10-CM

## 2014-05-13 DIAGNOSIS — F17210 Nicotine dependence, cigarettes, uncomplicated: Secondary | ICD-10-CM | POA: Diagnosis not present

## 2014-05-13 LAB — COMPREHENSIVE METABOLIC PANEL
ALBUMIN: 4.4 g/dL (ref 3.5–5.2)
ALT: 31 U/L (ref 0–35)
ANION GAP: 9 (ref 5–15)
AST: 45 U/L — ABNORMAL HIGH (ref 0–37)
Alkaline Phosphatase: 48 U/L (ref 39–117)
BILIRUBIN TOTAL: 1.6 mg/dL — AB (ref 0.3–1.2)
BUN: 15 mg/dL (ref 6–23)
CALCIUM: 9.4 mg/dL (ref 8.4–10.5)
CO2: 29 mmol/L (ref 19–32)
CREATININE: 0.94 mg/dL (ref 0.50–1.10)
Chloride: 97 mmol/L (ref 96–112)
GFR calc Af Amer: 71 mL/min — ABNORMAL LOW (ref >90)
GFR calc non Af Amer: 61 mL/min — ABNORMAL LOW (ref >90)
Glucose, Bld: 127 mg/dL — ABNORMAL HIGH (ref 70–99)
Potassium: 3.9 mmol/L (ref 3.5–5.1)
Sodium: 135 mmol/L (ref 135–145)
TOTAL PROTEIN: 6.8 g/dL (ref 6.0–8.3)

## 2014-05-13 LAB — PROTIME-INR
INR: 3.05 — ABNORMAL HIGH (ref 0.00–1.49)
Prothrombin Time: 31.8 seconds — ABNORMAL HIGH (ref 11.6–15.2)

## 2014-05-13 LAB — CBC
HEMATOCRIT: 43.8 % (ref 36.0–46.0)
Hemoglobin: 14.3 g/dL (ref 12.0–15.0)
MCH: 32.6 pg (ref 26.0–34.0)
MCHC: 32.6 g/dL (ref 30.0–36.0)
MCV: 100 fL (ref 78.0–100.0)
PLATELETS: 331 10*3/uL (ref 150–400)
RBC: 4.38 MIL/uL (ref 3.87–5.11)
RDW: 16.1 % — AB (ref 11.5–15.5)
WBC: 15.3 10*3/uL — AB (ref 4.0–10.5)

## 2014-05-13 LAB — RETICULOCYTES
RBC.: 4.38 MIL/uL (ref 3.87–5.11)
RETIC COUNT ABSOLUTE: 153.3 10*3/uL (ref 19.0–186.0)
Retic Ct Pct: 3.5 % — ABNORMAL HIGH (ref 0.4–3.1)

## 2014-05-13 LAB — TYPE AND SCREEN
ABO/RH(D): O POS
Antibody Screen: NEGATIVE

## 2014-05-13 LAB — DIGOXIN LEVEL: Digoxin Level: 1.2 ng/mL (ref 0.8–2.0)

## 2014-05-13 LAB — APTT: APTT: 34 s (ref 24–37)

## 2014-05-13 LAB — TROPONIN I
TROPONIN I: 0.06 ng/mL — AB (ref <0.031)
Troponin I: 0.06 ng/mL — ABNORMAL HIGH (ref <0.031)

## 2014-05-13 MED ORDER — PANTOPRAZOLE SODIUM 40 MG PO TBEC: 80.0000 mg | DELAYED_RELEASE_TABLET | Freq: Every day | ORAL | Status: DC

## 2014-05-13 MED ORDER — SUCRALFATE 1 G PO TABS
2.0000 g | ORAL_TABLET | Freq: Three times a day (TID) | ORAL | Status: DC
  Administered 2014-05-13: 2 g via ORAL
  Filled 2014-05-13: qty 2

## 2014-05-13 MED ORDER — NICOTINE 21 MG/24HR TD PT24
21.0000 mg | MEDICATED_PATCH | Freq: Once | TRANSDERMAL | Status: DC
  Administered 2014-05-13: 21 mg via TRANSDERMAL

## 2014-05-13 MED ORDER — NICOTINE 21 MG/24HR TD PT24
MEDICATED_PATCH | TRANSDERMAL | Status: AC
  Filled 2014-05-13: qty 1

## 2014-05-13 MED ORDER — FAMOTIDINE IN NACL 20-0.9 MG/50ML-% IV SOLN
20.0000 mg | INTRAVENOUS | Status: AC
  Administered 2014-05-13: 20 mg via INTRAVENOUS
  Filled 2014-05-13: qty 50

## 2014-05-13 MED ORDER — PANTOPRAZOLE SODIUM 40 MG IV SOLR
40.0000 mg | INTRAVENOUS | Status: AC
  Administered 2014-05-13: 40 mg via INTRAVENOUS
  Filled 2014-05-13: qty 40

## 2014-05-13 MED ORDER — SODIUM CHLORIDE 0.9 % IV BOLUS (SEPSIS)
1000.0000 mL | Freq: Once | INTRAVENOUS | Status: AC
  Administered 2014-05-13: 1000 mL via INTRAVENOUS

## 2014-05-13 MED ORDER — SUCRALFATE 1 G PO TABS: 1.0000 g | ORAL_TABLET | Freq: Four times a day (QID) | ORAL | Status: DC

## 2014-05-13 NOTE — ED Notes (Signed)
Pt. May not be admitted per hospitalist. Repeat troponin ordered, will await lab results.

## 2014-05-13 NOTE — ED Notes (Signed)
hospitalist at bedside

## 2014-05-13 NOTE — ED Notes (Signed)
Pt very anxious and hungry, ok to give pt. Something to eat per Dr. Konrad DoloresMerrell. Pt given graham crackers and peanut butter.

## 2014-05-13 NOTE — ED Notes (Signed)
MD at bedside. 

## 2014-05-13 NOTE — ED Notes (Signed)
Hospitalist consulting with pt's cardiologist in [place], per pt. Request. Then decision will be made as to whether pt. Stays here as inpatient or travels to [place].

## 2014-05-13 NOTE — ED Provider Notes (Signed)
CSN: [id number]     Arrival date & time 05/13/14  1343 History   First MD Initiated Contact with Patient 05/13/14 1354     Chief Complaint  Patient presents with  . 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 She presents from an urgent care where she was found to have dark and tarry stools. She has complained of 2L movements which have been black and tarry and has had additional shortness of breath and lightheadedness for the last several days. She denies vomiting chest pain, swelling of the lower extremities or changes in vision. Her weakness is progressive, she was found to have an INR of 3.6 prior to arrival. Weakness is generalized and nonfocal. Stools are black and tarry, nothing makes this better or worse, no history of GI bleed  Patient is a 69 y.o. female presenting with hematochezia. The history is provided by the patient and the spouse.  Rectal Bleeding   Past Medical History  Diagnosis Date  . Brain aneurysm 10/20/13    3 on lt side and 1 on the right, most recent Oct 1  . Thyroid disease   . Hypertension   . COPD (chronic obstructive pulmonary disease)   . Asthma   . Chronic back pain   . Sciatica    Past Surgical History  Procedure Laterality Date  . Brain surgery    . Cardiac surgery    . Cholecystectomy    . Appendectomy     History reviewed. No pertinent family history. History  Substance Use Topics  . Smoking status: Heavy Tobacco Smoker  . Smokeless tobacco: Not on file  . Alcohol Use: Yes   OB History    No data available     Review of Systems  Gastrointestinal: Positive for hematochezia.  All other systems reviewed and are  negative.     Allergies  Sulfa antibiotics  Home Medications   Prior to Admission medications   Medication Sig Start Date End Date Taking? Authorizing Provider  albuterol (PROVENTIL) (2.5 MG/3ML) 0.083% nebulizer solution Take 2.5 mg by nebulization 2 (two) times daily.   Yes Historical Provider, MD  albuterol (VENTOLIN HFA) 108 (90 BASE) MCG/ACT inhaler Inhale 2 puffs into the lungs 4 (four) times daily.   Yes Historical Provider, MD  aspirin EC 81 MG tablet Take 81 mg by mouth every morning.   Yes Historical Provider, MD  clonazePAM (KLONOPIN) 1 MG tablet Take 1 tablet by mouth 3 (three) times daily. 04/28/14  Yes Historical Provider, MD  clopidogrel (PLAVIX) 75 MG tablet Take 37.5 mg by mouth every morning.   Yes Historical Provider, MD  COUMADIN 5 MG tablet Take 1 tablet by mouth. Patient takes Ulanda Edison, Sat, Wynelle Link 05/12/14  Yes Historical Provider, MD  digoxin (LANOXIN) 0.25 MG tablet Take 0.25 mg by mouth every morning.   Yes Historical Provider, MD  fluticasone (FLONASE) 50 MCG/ACT nasal spray Place 2 sprays into both nostrils every morning.   Yes Historical Provider, MD  guaiFENesin (MUCINEX) 600 MG 12 hr tablet Take 600 mg by mouth 2 (two) times daily.   Yes Historical Provider, MD  levothyroxine (SYNTHROID, LEVOTHROID) 50 MCG tablet Take 50 mcg by mouth every morning.   Yes Historical Provider, MD  losartan (COZAAR) 50 MG tablet Take 50  mg by mouth every evening.   Yes Historical Provider, MD  metoprolol tartrate (LOPRESSOR) 25 MG tablet Take 12.5 mg by mouth every evening.   Yes Historical Provider, MD  montelukast (SINGULAIR) 10 MG tablet Take 10 mg by mouth every morning.   Yes Historical Provider, MD  oxymetazoline (AFRIN) 0.05 % nasal spray Place 1 spray into both nostrils 2 (two) times daily.   Yes Historical Provider, MD  pantoprazole (PROTONIX) 40 MG tablet Take 40 mg by mouth every morning.   Yes Historical Provider, MD  predniSONE (DELTASONE) 10 MG tablet Take 10 mg by  mouth daily. For 7 days starting on 11/10/2013   Yes Historical Provider, MD  promethazine (PHENERGAN) 12.5 MG tablet Take 12.5 mg by mouth every 6 (six) hours as needed for nausea or vomiting.   Yes Historical Provider, MD  warfarin (COUMADIN) 7.5 MG tablet Take 7.5 mg by mouth every evening. Patient takes Mon, Wed, Fri.   Yes Historical Provider, MD   BP 127/50 mmHg  Pulse 70  Temp(Src) 98.4 F (36.9 C) (Oral)  Resp 20  Ht 5' 6.5" (1.689 m)  Wt 140 lb (63.504 kg)  BMI 22.26 kg/m2  SpO2 92% Physical Exam  Constitutional: She appears well-developed and well-nourished. No distress.  HENT:  Head: Normocephalic and atraumatic.  Mouth/Throat: Oropharynx is clear and moist. No oropharyngeal exudate.  Eyes: Conjunctivae and EOM are normal. Pupils are equal, round, and reactive to light. Right eye exhibits no discharge. Left eye exhibits no discharge. No scleral icterus.  Neck: Normal range of motion. Neck supple. No JVD present. No thyromegaly present.  Cardiovascular: Normal rate, regular rhythm, normal heart sounds and intact distal pulses.  Exam reveals no gallop and no friction rub.   No murmur heard. Pulmonary/Chest: Effort normal and breath sounds normal. No respiratory distress. She has no wheezes. She has no rales.  Abdominal: Soft. Bowel sounds are normal. She exhibits no distension and no mass. There is no tenderness.  Genitourinary:  Chaperone present for rectal exam, stool grossly melanotic, small non tender, non bleeding external hemorrhoid.  No fissures or bright red blood.  Stool is granular and dark - hemoccult +  Musculoskeletal: Normal range of motion. She exhibits no edema or tenderness.  Lymphadenopathy:    She has no cervical adenopathy.  Neurological: She is alert. Coordination normal.  Skin: Skin is warm and dry. No rash noted. No erythema.  Psychiatric: She has a normal mood and affect. Her behavior is normal.  Nursing note and vitals reviewed.   ED Course   Procedures (including critical care time) Labs Review Labs Reviewed  CBC - Abnormal; Notable for the following:    WBC 15.3 (*)    RDW 16.1 (*)    All other components within normal limits  COMPREHENSIVE METABOLIC PANEL - Abnormal; Notable for the following:    Glucose, Bld 127 (*)    AST 45 (*)    Total Bilirubin 1.6 (*)    GFR calc non Af Amer 61 (*)    GFR calc Af Amer 71 (*)    All other components within normal limits  PROTIME-INR - Abnormal; Notable for the following:    Prothrombin Time 31.8 (*)    INR 3.05 (*)    All other components within normal limits  RETICULOCYTES - Abnormal; Notable for the following:    Retic Ct Pct 3.5 (*)    All other components within normal limits  APTT  DIGOXIN LEVEL  VITAMIN B12  FOLATE  IRON AND TIBC  FERRITIN  TROPONIN I  POC OCCULT BLOOD, ED  TYPE AND SCREEN    Imaging Review No results found.   EKG Interpretation   Date/Time:  Saturday May 13 2014 13:50:40 EDT Ventricular Rate:  93 PR Interval:    QRS Duration: 102 QT Interval:  340 QTC Calculation: 422 R Axis:   -30 Text Interpretation:  Atrial fibrillation with premature ventricular or  aberrantly conducted complexes Left axis deviation Anteroseptal infarct ,  age undetermined Abnormal ECG No old tracing to compare Confirmed by  Rhys Lichty  MD, Larsen Dungan ([id number]) on 05/13/2014 1:54:50 PM      MDM   Final diagnoses:  Upper GI bleed    The patient is having a GI bleed, this is very concerning as the patient is anticoagulated for artificial valves. Hemoglobin pending, cardiac monitoring, IV access obtained.  Hemoccult +  Hemoglobin and a normal level, blood pressure has remained stable, no tachycardia, concern because of the patient's triple anticoagulated state that she will need to be observed in the hospital overnight, likely needs GI consult, discussed with hospitalist who agrees to observational admission.  Meds given in ED:  Medications  sodium chloride 0.9 %  bolus 1,000 mL (1,000 mLs Intravenous New Bag/Given 05/13/14 1435)  famotidine (PEPCID) IVPB 20 mg (20 mg Intravenous New Bag/Given 05/13/14 1433)  pantoprazole (PROTONIX) injection 40 mg (40 mg Intravenous Given 05/13/14 1431)    New Prescriptions   No medications on file      Eber HongBrian Aprel Egelhoff, MD 05/13/14 1536

## 2014-05-13 NOTE — Consult Note (Signed)
[facility] Consult Note  Jasmine West WUJ:[id number] DOB: 03-Jan-1946 DOA: 05/13/2014  Referring physician: Dr Hyacinth MeekerMiller - [facility] PCP: Elise BenneHenderson, William W, MD   Chief Complaint: rectal bleeding  HPI: Jasmine West. One was 4 days ago and one was one day ago. Normal in volume. Patient is without any other acute complaint though she does complain of generalized weakness, shortness of breath and lightheadedness which is been ongoing since [facility]. Symptoms are intermittent. Denies chest pain, hematemesis, nausea, abdominal pain, lower extremity swelling, fevers, constipation, palpitations. Patient also endorses finishing up a 7 day course of doxycycline for pneumonia a few days ago.    Review of Systems:  Constitutional:  No weight loss, night sweats, Fevers, chills,.  HEENT:  No headaches, Difficulty swallowing,Tooth/dental problems,Sore throat,  No sneezing, itching, ear ache, nasal congestion, post nasal drip,  Cardio-vascular:  No chest pain, Orthopnea, PND, swelling in lower extremities, anasarca, dizziness, palpitations  GI: Per HPI Resp:   No shortness of breath with exertion or at rest. No excess mucus, no productive cough, No non-productive cough, No coughing up of blood.No change in color of mucus.No wheezing.No chest wall deformity  Skin:  no rash or lesions.  GU:  no dysuria, change in color of urine, no urgency or frequency. No flank pain.  Musculoskeletal:   No joint pain or swelling. No decreased range of motion. No back pain.  Psych:  No change in mood or affect. No depression or anxiety. No memory loss.   Past Medical History  Diagnosis Date  . Brain aneurysm 10/20/13    3 on lt side and 1 on the right, most recent Oct 1  . Thyroid disease   . Hypertension   . COPD (chronic obstructive pulmonary disease)   . Asthma   . Chronic back  pain   . Sciatica    Past Surgical History  Procedure Laterality Date  . Brain surgery      aneurysm stent placed 2015  . Cardiac surgery    . Cholecystectomy    . Appendectomy     Social History:  reports that she has been smoking Cigarettes.  She has a 40 pack-year smoking history. She does not have any smokeless tobacco history on file. She reports that she drinks alcohol. She reports that she does not use illicit drugs.  Allergies  Allergen Reactions  . Sulfa Antibiotics Photosensitivity    Family History  Problem Relation Age of Onset  . Cancer Sister   . Cancer Brother   . Kidney failure Father      Prior to Admission medications   Medication Sig Start Date End Date Taking? Authorizing Provider  albuterol (PROVENTIL) (2.5 MG/3ML) 0.083% nebulizer solution Take 2.5 mg by nebulization 2 (two) times daily.   Yes Historical Provider, MD  albuterol (VENTOLIN HFA) 108 (90 BASE) MCG/ACT inhaler Inhale 2 puffs into the lungs 4 (four) times daily.   Yes Historical Provider, MD  aspirin EC 81 MG tablet Take 81 mg by mouth every morning.   Yes Historical Provider, MD  clonazePAM (KLONOPIN) 1 MG tablet Take 1 tablet by mouth 3 (three) times daily. 04/28/14  Yes Historical Provider, MD  clopidogrel (PLAVIX) 75 MG tablet Take 37.5 mg by mouth every morning.   Yes Historical Provider, MD  COUMADIN 5 MG tablet Take 1 tablet by mouth. Patient takes tues, thurs, Sat,  Wynelle Link 05/12/14  Yes Historical Provider, MD  digoxin (LANOXIN) 0.25 MG tablet Take 0.25 mg by mouth every morning.   Yes Historical Provider, MD  fluticasone (FLONASE) 50 MCG/ACT nasal spray Place 2 sprays into both nostrils every morning.   Yes Historical Provider, MD  guaiFENesin (MUCINEX) 600 MG 12 hr tablet Take 600 mg by mouth 2 (two) times daily.   Yes Historical Provider, MD  levothyroxine (SYNTHROID, LEVOTHROID) 50 MCG tablet Take 50 mcg by mouth every morning.   Yes Historical Provider, MD  losartan (COZAAR) 50 MG tablet  Take 50 mg by mouth every evening.   Yes Historical Provider, MD  metoprolol tartrate (LOPRESSOR) 25 MG tablet Take 12.5 mg by mouth every evening.   Yes Historical Provider, MD  montelukast (SINGULAIR) 10 MG tablet Take 10 mg by mouth every morning.   Yes Historical Provider, MD  oxymetazoline (AFRIN) 0.05 % nasal spray Place 1 spray into both nostrils 2 (two) times daily.   Yes Historical Provider, MD  pantoprazole (PROTONIX) 40 MG tablet Take 40 mg by mouth every morning.   Yes Historical Provider, MD  predniSONE (DELTASONE) 10 MG tablet Take 10 mg by mouth daily. For 7 days starting on 11/10/2013   Yes Historical Provider, MD  promethazine (PHENERGAN) 12.5 MG tablet Take 12.5 mg by mouth every 6 (six) hours as needed for nausea or vomiting.   Yes Historical Provider, MD  warfarin (COUMADIN) 7.5 MG tablet Take 7.5 mg by mouth every evening. Patient takes Mon, Wed, Fri.   Yes Historical Provider, MD   Physical Exam: Filed Vitals:   05/13/14 1530 05/13/14 1600 05/13/14 1700 05/13/14 1730  BP: 138/50 152/64 136/46 146/73  Pulse: 70 71 63 76  Temp:      TempSrc:      Resp: Height:      Weight:      SpO2: 95% 97% 93% 97%    Wt Readings from Last 3 Encounters:  05/13/14 63.504 kg (140 lb)  11/11/13 59.875 kg (132 lb)    General: ANxious Eyes:  PERRL, normal lids, irises & conjunctiva ENT:  grossly normal hearing, lips & tongue Neck:  no LAD, masses or thyromegaly Cardiovascular:  RRR, no m/r/g. No LE edema. Telemetry:  Afib  Respiratory:  CTA bilaterally, no w/r/r. Normal respiratory effort. Abdomen:  soft, ntnd Skin:  no rash or induration seen on limited exam Musculoskeletal:  grossly normal tone BUE/BLE Psychiatric:  grossly normal mood and affect, speech fluent and appropriate Neurologic:  grossly non-focal.          Labs on Admission:  Basic Metabolic Panel:  Recent Labs Lab 05/13/14 1434  NA 135  K 3.9  CL 97  CO2 29  GLUCOSE 127*  BUN 15    CREATININE 0.94  CALCIUM 9.4   Liver Function Tests:  Recent Labs Lab 05/13/14 1434  AST 45*  ALT 31  ALKPHOS 48  BILITOT 1.6*  PROT 6.8  ALBUMIN 4.4   No results for input(s): LIPASE, AMYLASE in the last 168 hours. No results for input(s): AMMONIA in the last 168 hours. CBC:  Recent Labs Lab 05/13/14 1434  WBC 15.3*  HGB 14.3  HCT 43.8  MCV 100.0  PLT 331   Cardiac Enzymes:  Recent Labs Lab 05/13/14 1438 05/13/14 1613  TROPONINI 0.06* 0.06*    BNP (last 3 results) No results for input(s): BNP in the last 8760 hours.  ProBNP (last 3 results) No results for input(s): PROBNP  in the last 8760 hours.  CBG: No results for input(s): GLUCAP in the last 168 hours.  Radiological Exams on Admission: No results found.   Assessment/Plan Active Problems:   COPD (chronic obstructive pulmonary disease)   Aneurysm, cerebral   Upper GI bleed   Tobacco use   Elevated troponin   Leukocytosis   GI bleed: The upper GI. Supratherapeutic INR. On Coumadin, Plavix, aspirin. Patient with a history of GERD on Protonix. Hemoglobin 14.5. Discussed case at length with Dr.Rehman, vital signs stable and asymptomatic. Recommend patient continue current regimen of anticoagulation given significant pathology of brain aneurysm status post stent and mitral and aortic valve replacements. Patient to avoid NSAIDs, double her Protonix dose or go to twice a day, and start Carafate. Patient to follow-up her GI doctor in [place]. Of note her last colonoscopy was 8 years ago and was normal. Patient aware that if she develops worsening and more frequent bloody stools she needs to go to the emergency room.  Elevated troponins. Initially troponin elevated at 0.06 of note patient has chronic systolic heart failure with an EF of 35-40%, she is status post mitral and aortic valve replacements, and recently got over pneumonia. These findings were discussed with the on-call cardiologist, Dr. Mayford Knife as  well as with the patient's cardiologist Dr. Zerita Boers in [place]. Her troponin was repeated and was found again to be at 0.06. There is no old EKG to compare but there is no sign of acute coronary syndrome as well patient is completely asymptomatic at this time. 3 troponin findings were felt to be due to demand and per her cardiologist it is not unusual for her to have this level of troponin. Dr. Earna Coder feels that the patient is safe for discharge and will follow up with her this [place] for a stress test as was previously scheduled. Patient is aware of signs and symptoms of heart attack and will go to the emergency room if she develops any of the symptoms. Patient with nitroglycerin at home if needed.  Leukocytosis: Patient with white count of 15.3. Of note patient just finished a course of doxycycline for pneumonia. She has no respiratory symptoms. There might also be some element of this that is due to patient's stress. Afebrile and vital signs stable.  Tobacco cessation: Approximately 15 minutes of time was spent discussing tobacco cessation with the family. Nicotine 21 mg patch was applied prior to patient's discharge. Patient to continue to work on this at home. Patient is aware of the importance of quitting smoking as she has severe COPD.  Family Communication: Husband Disposition Plan: Stable for DC  MERRELL, DAVID Shela Commons, MD Family Medicine [facility] [email or web address] Password [email or web address]

## 2014-05-13 NOTE — ED Notes (Signed)
Pt anxious, wanting to go out and smoke, notified hospitalist.

## 2014-05-13 NOTE — ED Provider Notes (Signed)
Care was further discussed with me by Dr. Konrad DoloresMerrell, the [facility]. He has evaluated the patient. He has ordered additional testing and troponins which have remained stable. He has discussed the case with the patient, her GI doctor, and cardiology. Has recommended that she be discharged home with increasing doses, of Carafate, Protonix, early GI follow-up. Careful return precautions given.  Rolland PorterMark Maisen Klingler, MD 05/13/14 [id number]

## 2014-05-13 NOTE — Discharge Instructions (Signed)
Bloody Stools Bloody stools means there is blood in your poop (stool). It is a sign that there is a problem somewhere in the digestive system. It is important for your doctor to find the cause of your bleeding, so the problem can be treated.  HOME CARE  Only take medicine as told by your doctor.  Eat foods with fiber (prunes, bran cereals).  Drink enough fluids to keep your pee (urine) clear or pale yellow.  Sit in warm water (sitz bath) for 10 to 15 minutes as told by your doctor.  Know how to take your medicines (enemas, suppositories) if advised by your doctor.  Watch for signs that you are getting better or getting worse. GET HELP RIGHT AWAY IF:   You are not getting better.  You start to get better but then get worse again.  You have new problems.  You have severe bleeding from the place where poop comes out (rectum) that does not stop.  You throw up (vomit) blood.  You feel weak or pass out (faint).  You have a fever. MAKE SURE YOU:   Understand these instructions.  Will watch your condition.  Will get help right away if you are not doing well or get worse. Document Released: 12/25/2008 Document Revised: 03/31/2011 Document Reviewed: 05/24/2010 [facility] Patient Information 2015 [place], [place]. This information is not intended to replace advice given to you by your health care provider. Make sure you discuss any questions you have with your health care provider.  Gastrointestinal Bleeding Gastrointestinal (GI) bleeding means there is bleeding somewhere along the digestive tract, between the mouth and anus. CAUSES  There are many different problems that can cause GI bleeding. Possible causes include:  Esophagitis. This is inflammation, irritation, or swelling of the esophagus.  Hemorrhoids.These are veins that are full of blood (engorged) in the rectum. They cause pain, inflammation, and may bleed.  Anal fissures.These are areas of painful tearing which may  bleed. They are often caused by passing hard stool.  Diverticulosis.These are pouches that form on the colon over time, with age, and may bleed significantly.  Diverticulitis.This is inflammation in areas with diverticulosis. It can cause pain, fever, and bloody stools, although bleeding is rare.  Polyps and cancer. Colon cancer often starts out as precancerous polyps.  Gastritis and ulcers.Bleeding from the upper gastrointestinal tract (near the stomach) may travel through the intestines and produce black, sometimes tarry, often bad smelling stools. In certain cases, if the bleeding is fast enough, the stools may not be black, but red. This condition may be life-threatening. SYMPTOMS   Vomiting bright red blood or material that looks like coffee grounds.  Bloody, black, or tarry stools. DIAGNOSIS  Your caregiver may diagnose your condition by taking your history and performing a physical exam. More tests may be needed, including:  X-rays and other imaging tests.  Esophagogastroduodenoscopy (EGD). This test uses a flexible, lighted tube to look at your esophagus, stomach, and small intestine.  Colonoscopy. This test uses a flexible, lighted tube to look at your colon. TREATMENT  Treatment depends on the cause of your bleeding.   For bleeding from the esophagus, stomach, small intestine, or colon, the caregiver doing your EGD or colonoscopy may be able to stop the bleeding as part of the procedure.  Inflammation or infection of the colon can be treated with medicines.  Many rectal problems can be treated with creams, suppositories, or warm baths.  Surgery is sometimes needed.  Blood transfusions are sometimes needed if  you have lost a lot of blood. If bleeding is slow, you may be allowed to go home. If there is a lot of bleeding, you will need to stay in the hospital for observation. HOME CARE INSTRUCTIONS   Take any medicines exactly as prescribed.  Keep your stools soft by  eating foods that are high in fiber. These foods include whole grains, legumes, fruits, and vegetables. Prunes (1 to 3 a day) work well for many people.  Drink enough fluids to keep your urine clear or pale yellow. SEEK IMMEDIATE MEDICAL CARE IF:   Your bleeding increases.  You feel lightheaded, weak, or you faint.  You have severe cramps in your back or abdomen.  You pass large blood clots in your stool.  Your problems are getting worse. MAKE SURE YOU:   Understand these instructions.  Will watch your condition.  Will get help right away if you are not doing well or get worse. Document Released: 01/04/2000 Document Revised: 12/24/2011 Document Reviewed: 12/16/2010 [facility] Patient Information 2015 [place]. This information is not intended to replace advice given to you by your health care provider. Make sure you discuss any questions you have with your health care provider.

## 2014-05-13 NOTE — ED Notes (Signed)
Pt reports black tarry stools,dizziness,nausea and intermittent sob for last several days. Pt sent here from [facility]. Pt INR 3.6 per [facility] staff.

## 2014-05-14 LAB — VITAMIN B12: VITAMIN B 12: 581 pg/mL (ref 211–911)

## 2014-05-14 LAB — IRON AND TIBC
IRON: 67 ug/dL (ref 42–145)
SATURATION RATIOS: 15 % — AB (ref 20–55)
TIBC: 442 ug/dL (ref 250–470)
UIBC: 375 ug/dL (ref 125–400)

## 2014-05-14 LAB — FERRITIN: Ferritin: 32 ng/mL (ref 10–291)

## 2014-05-14 LAB — FOLATE: Folate: >20 ng/mL

## 2014-05-15 LAB — POC OCCULT BLOOD, ED: Fecal Occult Bld: POSITIVE — AB

## 2014-06-07 ENCOUNTER — Emergency Department (HOSPITAL_COMMUNITY): Payer: Medicare Other

## 2014-06-07 ENCOUNTER — Encounter (HOSPITAL_COMMUNITY): Payer: Self-pay | Admitting: *Deleted

## 2014-06-07 ENCOUNTER — Emergency Department (HOSPITAL_COMMUNITY)
Admission: EM | Admit: 2014-06-07 | Discharge: 2014-06-07 | Disposition: A | Discharge status: Home / Self Care | Payer: Medicare Other | Attending: Emergency Medicine | Admitting: Emergency Medicine

## 2014-06-07 DIAGNOSIS — Z954 Presence of other heart-valve replacement: Secondary | ICD-10-CM | POA: Insufficient documentation

## 2014-06-07 DIAGNOSIS — Z7952 Long term (current) use of systemic steroids: Secondary | ICD-10-CM | POA: Diagnosis not present

## 2014-06-07 DIAGNOSIS — I509 Heart failure, unspecified: Secondary | ICD-10-CM | POA: Diagnosis not present

## 2014-06-07 DIAGNOSIS — I10 Essential (primary) hypertension: Secondary | ICD-10-CM | POA: Diagnosis not present

## 2014-06-07 DIAGNOSIS — M5430 Sciatica, unspecified side: Secondary | ICD-10-CM | POA: Insufficient documentation

## 2014-06-07 DIAGNOSIS — R7989 Other specified abnormal findings of blood chemistry: Secondary | ICD-10-CM | POA: Insufficient documentation

## 2014-06-07 DIAGNOSIS — R778 Other specified abnormalities of plasma proteins: Secondary | ICD-10-CM

## 2014-06-07 DIAGNOSIS — E079 Disorder of thyroid, unspecified: Secondary | ICD-10-CM | POA: Diagnosis not present

## 2014-06-07 DIAGNOSIS — I4891 Unspecified atrial fibrillation: Secondary | ICD-10-CM | POA: Insufficient documentation

## 2014-06-07 DIAGNOSIS — G8929 Other chronic pain: Secondary | ICD-10-CM | POA: Diagnosis not present

## 2014-06-07 DIAGNOSIS — J441 Chronic obstructive pulmonary disease with (acute) exacerbation: Secondary | ICD-10-CM | POA: Diagnosis not present

## 2014-06-07 DIAGNOSIS — M79604 Pain in right leg: Secondary | ICD-10-CM | POA: Diagnosis present

## 2014-06-07 DIAGNOSIS — Z7901 Long term (current) use of anticoagulants: Secondary | ICD-10-CM | POA: Insufficient documentation

## 2014-06-07 DIAGNOSIS — R234 Changes in skin texture: Secondary | ICD-10-CM | POA: Diagnosis not present

## 2014-06-07 DIAGNOSIS — Z79899 Other long term (current) drug therapy: Secondary | ICD-10-CM | POA: Insufficient documentation

## 2014-06-07 DIAGNOSIS — Z720 Tobacco use: Secondary | ICD-10-CM | POA: Insufficient documentation

## 2014-06-07 DIAGNOSIS — Z7982 Long term (current) use of aspirin: Secondary | ICD-10-CM | POA: Insufficient documentation

## 2014-06-07 LAB — CBC WITH DIFFERENTIAL/PLATELET
BASOS PCT: 0 % (ref 0–1)
Basophils Absolute: 0 10*3/uL (ref 0.0–0.1)
EOS ABS: 0 10*3/uL (ref 0.0–0.7)
Eosinophils Relative: 0 % (ref 0–5)
HEMATOCRIT: 36.2 % (ref 36.0–46.0)
Hemoglobin: 11.3 g/dL — ABNORMAL LOW (ref 12.0–15.0)
LYMPHS PCT: 17 % (ref 12–46)
Lymphs Abs: 1.6 10*3/uL (ref 0.7–4.0)
MCH: 30.4 pg (ref 26.0–34.0)
MCHC: 31.2 g/dL (ref 30.0–36.0)
MCV: 97.3 fL (ref 78.0–100.0)
MONOS PCT: 8 % (ref 3–12)
Monocytes Absolute: 0.7 10*3/uL (ref 0.1–1.0)
Neutro Abs: 6.9 10*3/uL (ref 1.7–7.7)
Neutrophils Relative %: 75 % (ref 43–77)
PLATELETS: 301 10*3/uL (ref 150–400)
RBC: 3.72 MIL/uL — ABNORMAL LOW (ref 3.87–5.11)
RDW: 15.4 % (ref 11.5–15.5)
WBC: 9.3 10*3/uL (ref 4.0–10.5)

## 2014-06-07 LAB — BASIC METABOLIC PANEL
ANION GAP: 7 (ref 5–15)
BUN: 14 mg/dL (ref 6–20)
CALCIUM: 8.4 mg/dL — AB (ref 8.9–10.3)
CO2: 31 mmol/L (ref 22–32)
CREATININE: 0.93 mg/dL (ref 0.44–1.00)
Chloride: 99 mmol/L — ABNORMAL LOW (ref 101–111)
GFR calc non Af Amer: >60 mL/min (ref >60)
Glucose, Bld: 103 mg/dL — ABNORMAL HIGH (ref 65–99)
Potassium: 3.6 mmol/L (ref 3.5–5.1)
Sodium: 137 mmol/L (ref 135–145)

## 2014-06-07 LAB — BRAIN NATRIURETIC PEPTIDE: B Natriuretic Peptide: 168 pg/mL — ABNORMAL HIGH (ref 0.0–100.0)

## 2014-06-07 LAB — TROPONIN I: Troponin I: 0.07 ng/mL — ABNORMAL HIGH (ref <0.031)

## 2014-06-07 MED ORDER — HYDROCODONE-ACETAMINOPHEN 5-325 MG PO TABS: 1.0000 | ORAL_TABLET | ORAL | Status: DC | PRN

## 2014-06-07 MED ORDER — FUROSEMIDE 40 MG PO TABS: 40.0000 mg | ORAL_TABLET | Freq: Every day | ORAL | Status: DC

## 2014-06-07 MED ORDER — TRAMADOL HCL 50 MG PO TABS: 50.0000 mg | ORAL_TABLET | Freq: Four times a day (QID) | ORAL | Status: DC | PRN

## 2014-06-07 NOTE — ED Provider Notes (Signed)
CSN: [id number]     Arrival date & time 06/07/14  1103 History  This chart was scribed for Jasmine Core, MD by Tanda Rockers, ED Scribe. This patient was seen in room APA09/APA09 and the patient's care was started at 11:33 AM.    Chief Complaint  Patient presents with  . Leg Pain   The history is provided by the patient. No language interpreter was used.     HPI Comments: Jasmine West is a 69 y.o. female with hx HTN, atrial fibrillation, and COPD who presents to the Emergency Department complaining of right leg pain x 3 weeks. Pt also notes increased swelling to the right leg that began in the ankles and radiated up her leg. Pt states that yesterday the pain became exacerbated and she could not bear weight onto the leg. She describes the pain as a burning sensation. Pt was sent to the ED by cardiologist for these symptoms. Pt is currently on Prednisone for COPD but states worsening shortness of breath. Pt is on Coumadin for A fib. Denies chest pain, cough, or any other symptoms.   Past Medical History  Diagnosis Date  . Brain aneurysm 10/20/13    3 on lt side and 1 on the right, most recent Oct 1  . Thyroid disease   . Hypertension   . COPD (chronic obstructive pulmonary disease)   . Asthma   . Chronic back pain   . Sciatica   . S/P tricuspid valve replacement   . Aortic valve replaced    Past Surgical History  Procedure Laterality Date  . Brain surgery      aneurysm stent placed 2015  . Cardiac surgery    . Cholecystectomy    . Appendectomy     Family History  Problem Relation Age of Onset  . Cancer Sister   . Cancer Brother   . Kidney failure Father    History  Substance Use Topics  . Smoking status: Heavy Tobacco Smoker -- 1.00 packs/day for 40 years    Types: Cigarettes  . Smokeless tobacco: Not on file  . Alcohol Use: No   OB History    No data available     Review of Systems  Constitutional: Negative for fever and chills.  HENT: Negative for congestion and  rhinorrhea.   Respiratory: Positive for shortness of breath. Negative for cough.   Cardiovascular: Negative for chest pain.  Gastrointestinal: Negative for nausea, vomiting, abdominal pain and diarrhea.  Genitourinary: Negative for dysuria, hematuria and difficulty urinating.  Musculoskeletal: Positive for joint swelling (Right leg swelling. ) and arthralgias (Right leg pain. ).  Neurological: Negative for headaches.      Allergies  Sulfa antibiotics  Home Medications   Prior to Admission medications   Medication Sig Start Date End Date Taking? Authorizing Provider  albuterol (PROVENTIL) (2.5 MG/3ML) 0.083% nebulizer solution Take 2.5 mg by nebulization 2 (two) times daily.   Yes Historical Provider, MD  albuterol (VENTOLIN HFA) 108 (90 BASE) MCG/ACT inhaler Inhale 2 puffs into the lungs 4 (four) times daily.   Yes Historical Provider, MD  aspirin EC 81 MG tablet Take 81 mg by mouth every morning.   Yes Historical Provider, MD  clonazePAM (KLONOPIN) 1 MG tablet Take 1 tablet by mouth 3 (three) times daily. 04/28/14  Yes Historical Provider, MD  COUMADIN 5 MG tablet Take 1 tablet by mouth daily. Patient takes every day. 05/12/14  Yes Historical Provider, MD  digoxin (LANOXIN) 0.25 MG tablet Take 0.25 mg by  mouth every morning.   Yes Historical Provider, MD  fluticasone (FLONASE) 50 MCG/ACT nasal spray Place 2 sprays into both nostrils every morning.   Yes Historical Provider, MD  guaiFENesin (MUCINEX) 600 MG 12 hr tablet Take 600 mg by mouth 2 (two) times daily.   Yes Historical Provider, MD  levothyroxine (SYNTHROID, LEVOTHROID) 50 MCG tablet Take 50 mcg by mouth every morning.   Yes Historical Provider, MD  losartan (COZAAR) 50 MG tablet Take 50 mg by mouth every evening.   Yes Historical Provider, MD  metoprolol tartrate (LOPRESSOR) 25 MG tablet Take 12.5 mg by mouth every evening.   Yes Historical Provider, MD  montelukast (SINGULAIR) 10 MG tablet Take 10 mg by mouth every morning.   Yes  Historical Provider, MD  oxymetazoline (AFRIN) 0.05 % nasal spray Place 1 spray into both nostrils 2 (two) times daily.   Yes Historical Provider, MD  pantoprazole (PROTONIX) 40 MG tablet Take 2 tablets (80 mg total) by mouth daily. 05/13/14  Yes Rolland PorterMark James, MD  predniSONE (DELTASONE) 10 MG tablet Take 10 mg by mouth daily. For 7 days starting on 11/10/2013   Yes Historical Provider, MD  promethazine (PHENERGAN) 12.5 MG tablet Take 12.5 mg by mouth every 6 (six) hours as needed for nausea or vomiting.   Yes Historical Provider, MD  sucralfate (CARAFATE) 1 G tablet Take 1 tablet (1 g total) by mouth 4 (four) times daily. 05/13/14  Yes Rolland PorterMark James, MD  furosemide (LASIX) 40 MG tablet Take 1 tablet (40 mg total) by mouth daily. 06/07/14   Jasmine CoreNathan Alessander Sikorski, MD  HYDROcodone-acetaminophen (NORCO/VICODIN) 5-325 MG per tablet Take 1-2 tablets by mouth every 4 (four) hours as needed. 06/07/14   Jasmine CoreNathan Tkai Serfass, MD   Triage Vitals: BP 126/54 mmHg  Pulse 84  Temp(Src) 98.2 F (36.8 C) (Oral)  Resp 24  Ht 5' 6.5" (1.689 m)  Wt 140 lb (63.504 kg)  BMI 22.26 kg/m2  SpO2 91%   Physical Exam  Constitutional: She is oriented to person, place, and time. She appears well-developed and well-nourished. No distress.  HENT:  Head: Normocephalic and atraumatic.  Eyes: Conjunctivae and EOM are normal.  Neck: Neck supple. No JVD present. No tracheal deviation present.  Cardiovascular: Normal rate.   Pulmonary/Chest: Effort normal. No respiratory distress. She has wheezes (Mild diffuse wheezes. ).  Abdominal: Soft. There is no tenderness.  Musculoskeletal: Normal range of motion.  Moderate peripheral edema to BLEs.  Palpable DP pulses to both feet.  Good capillary refill in great toes bilaterally. Chronic skin changes to BLEs.   Neurological: She is alert and oriented to person, place, and time.  Skin: Skin is warm and dry.  Psychiatric: She has a normal mood and affect. Her behavior is normal.  Nursing note  and vitals reviewed.   ED Course  Procedures (including critical care time)  DIAGNOSTIC STUDIES: Oxygen Saturation is 91% on RA, low by my interpretation.    COORDINATION OF CARE: 11:37 AM-Discussed treatment plan which includes US right lower extremity with pt at bedside and pt agreed to plan.   Labs Review Labs Reviewed  BRAIN NATRIURETIC PEPTIDE - Abnormal; Notable for the following:    B Natriuretic Peptide 168.0 (*)    All other components within normal limits  TROPONIN I - Abnormal; Notable for the following:    Troponin I 0.07 (*)    All other components within normal limits  CBC WITH DIFFERENTIAL/PLATELET - Abnormal; Notable for the following:    RBC 3.72 (*)  Hemoglobin 11.3 (*)    All other components within normal limits  BASIC METABOLIC PANEL - Abnormal; Notable for the following:    Chloride 99 (*)    Glucose, Bld 103 (*)    Calcium 8.4 (*)    All other components within normal limits    Imaging Review Dg Chest 2 View  06/07/2014   CLINICAL DATA:  Worsening shortness of Breath  EXAM: CHEST  2 VIEW  COMPARISON:  11/11/2013  FINDINGS: Cardiac shadow remains enlarged. Postsurgical changes are again seen. The lungs are well aerated bilaterally without focal infiltrate or sizable effusion. Mild vascular congestion is noted without interstitial edema.  IMPRESSION: Mild vascular congestion without interstitial edema.  Stable cardiomegaly.   Electronically Signed   By: Alcide CleverMark  Lukens M.D.   On: 06/07/2014 12:30   [place] Venous Img Lower Unilateral Right  06/07/2014   CLINICAL DATA:  Right leg pain for 3 weeks.  Shortness of breath.  EXAM: RIGHT LOWER EXTREMITY VENOUS DOPPLER ULTRASOUND  TECHNIQUE: Gray-scale sonography with graded compression, as well as color Doppler and duplex ultrasound, were performed to evaluate the deep venous system from the level of the common femoral vein through the popliteal and proximal calf veins. Spectral Doppler was utilized to evaluate flow at  rest and with distal augmentation maneuvers.  COMPARISON:  None.  FINDINGS: Normal compressibility, augmentation and color Doppler flow in the right common femoral vein, right femoral vein and right popliteal vein. The right saphenofemoral junction is patent. Visualized deep right calf veins are patent. Right great saphenous vein is patent.  Left common femoral vein is patent.  IMPRESSION: Negative for deep vein thrombosis in the right lower extremity.   Electronically Signed   By: Richarda OverlieAdam  Henn M.D.   On: 06/07/2014 12:44     EKG Interpretation   Date/Time:  Wednesday Jun 07 2014 11:16:11 EDT Ventricular Rate:  91 PR Interval:    QRS Duration: 101 QT Interval:  371 QTC Calculation: 456 R Axis:   -13 Text Interpretation:  Atrial fibrillation Ventricular premature complex  Incomplete RBBB and LAFB Anterior infarct, old No significant change since  last tracing Confirmed by Jeannine Pennisi  MD, Harrold DonathNATHAN [phone]([id number]) on 06/07/2014  1:14:09 PM      MDM   Final diagnoses:  Congestive heart failure, unspecified congestive heart failure chronicity, unspecified congestive heart failure type  Elevated troponin   Patient presents with pain in her right lower extremity. Has swelling in both legs. Good arterial pulses and good capillary refill in the right leg. Does have a history of atrial fibrillation and is on Coumadin. Reportedly sent in from her cardiologist rule out DVT. Doppler was done and was negative. She does have some mild CHF and a troponin is minimally elevated, but at the same that it is been in the past. Will discharge home with some Lasix for a few days and a small amount of pain medicine. Will need to follow with her cardiologist. I personally performed the services described in this documentation, which was scribed in my presence. The recorded information has been reviewed and is accurate.       Jasmine CoreNathan Pleshette Tomasini, MD 06/07/14 1352

## 2014-06-07 NOTE — ED Notes (Signed)
After helping get the pt changed into gown and once pt was settled on stretcher, pulse ox increased to 97% RA,

## 2014-06-07 NOTE — Discharge Instructions (Signed)

## 2014-06-07 NOTE — ED Notes (Signed)
Pt sent to er by cardiologist with c/o right leg swelling and pain that started three weeks ago, left ankle swelling and pain that started two weeks ago, sob that became worse today,

## 2015-01-07 ENCOUNTER — Emergency Department (HOSPITAL_COMMUNITY): Payer: Medicare Other

## 2015-01-07 ENCOUNTER — Encounter (HOSPITAL_COMMUNITY): Payer: Self-pay | Admitting: Emergency Medicine

## 2015-01-07 ENCOUNTER — Inpatient Hospital Stay (HOSPITAL_COMMUNITY)
Admission: EM | Admit: 2015-01-07 | Discharge: 2015-01-13 | DRG: 281 | Disposition: A | Discharge status: Home / Self Care | Payer: Medicare Other | Attending: Internal Medicine | Admitting: Internal Medicine

## 2015-01-07 DIAGNOSIS — Z7952 Long term (current) use of systemic steroids: Secondary | ICD-10-CM | POA: Diagnosis not present

## 2015-01-07 DIAGNOSIS — Z952 Presence of prosthetic heart valve: Secondary | ICD-10-CM | POA: Diagnosis not present

## 2015-01-07 DIAGNOSIS — I4891 Unspecified atrial fibrillation: Secondary | ICD-10-CM | POA: Diagnosis present

## 2015-01-07 DIAGNOSIS — G8929 Other chronic pain: Secondary | ICD-10-CM | POA: Diagnosis present

## 2015-01-07 DIAGNOSIS — I214 Non-ST elevation (NSTEMI) myocardial infarction: Secondary | ICD-10-CM | POA: Diagnosis present

## 2015-01-07 DIAGNOSIS — E785 Hyperlipidemia, unspecified: Secondary | ICD-10-CM | POA: Diagnosis present

## 2015-01-07 DIAGNOSIS — Z23 Encounter for immunization: Secondary | ICD-10-CM | POA: Diagnosis not present

## 2015-01-07 DIAGNOSIS — I481 Persistent atrial fibrillation: Secondary | ICD-10-CM | POA: Diagnosis not present

## 2015-01-07 DIAGNOSIS — R7989 Other specified abnormal findings of blood chemistry: Secondary | ICD-10-CM | POA: Diagnosis present

## 2015-01-07 DIAGNOSIS — Z7901 Long term (current) use of anticoagulants: Secondary | ICD-10-CM

## 2015-01-07 DIAGNOSIS — I482 Chronic atrial fibrillation: Secondary | ICD-10-CM | POA: Diagnosis present

## 2015-01-07 DIAGNOSIS — M79605 Pain in left leg: Secondary | ICD-10-CM | POA: Diagnosis not present

## 2015-01-07 DIAGNOSIS — F17210 Nicotine dependence, cigarettes, uncomplicated: Secondary | ICD-10-CM | POA: Diagnosis present

## 2015-01-07 DIAGNOSIS — M542 Cervicalgia: Secondary | ICD-10-CM

## 2015-01-07 DIAGNOSIS — E039 Hypothyroidism, unspecified: Secondary | ICD-10-CM | POA: Diagnosis present

## 2015-01-07 DIAGNOSIS — R1310 Dysphagia, unspecified: Secondary | ICD-10-CM

## 2015-01-07 DIAGNOSIS — E038 Other specified hypothyroidism: Secondary | ICD-10-CM | POA: Diagnosis not present

## 2015-01-07 DIAGNOSIS — M549 Dorsalgia, unspecified: Secondary | ICD-10-CM | POA: Diagnosis present

## 2015-01-07 DIAGNOSIS — Q245 Malformation of coronary vessels: Secondary | ICD-10-CM

## 2015-01-07 DIAGNOSIS — I749 Embolism and thrombosis of unspecified artery: Secondary | ICD-10-CM | POA: Diagnosis not present

## 2015-01-07 DIAGNOSIS — Z954 Presence of other heart-valve replacement: Secondary | ICD-10-CM | POA: Diagnosis not present

## 2015-01-07 DIAGNOSIS — I779 Disorder of arteries and arterioles, unspecified: Secondary | ICD-10-CM | POA: Diagnosis present

## 2015-01-07 DIAGNOSIS — J438 Other emphysema: Secondary | ICD-10-CM | POA: Diagnosis not present

## 2015-01-07 DIAGNOSIS — I5022 Chronic systolic (congestive) heart failure: Secondary | ICD-10-CM | POA: Diagnosis present

## 2015-01-07 DIAGNOSIS — I7090 Unspecified atherosclerosis: Secondary | ICD-10-CM

## 2015-01-07 DIAGNOSIS — Z7982 Long term (current) use of aspirin: Secondary | ICD-10-CM | POA: Diagnosis not present

## 2015-01-07 DIAGNOSIS — R531 Weakness: Secondary | ICD-10-CM | POA: Diagnosis present

## 2015-01-07 DIAGNOSIS — I10 Essential (primary) hypertension: Secondary | ICD-10-CM | POA: Diagnosis present

## 2015-01-07 DIAGNOSIS — I70202 Unspecified atherosclerosis of native arteries of extremities, left leg: Secondary | ICD-10-CM | POA: Diagnosis present

## 2015-01-07 DIAGNOSIS — J449 Chronic obstructive pulmonary disease, unspecified: Secondary | ICD-10-CM | POA: Diagnosis present

## 2015-01-07 DIAGNOSIS — R29898 Other symptoms and signs involving the musculoskeletal system: Secondary | ICD-10-CM

## 2015-01-07 DIAGNOSIS — R791 Abnormal coagulation profile: Secondary | ICD-10-CM | POA: Diagnosis not present

## 2015-01-07 DIAGNOSIS — Z720 Tobacco use: Secondary | ICD-10-CM | POA: Diagnosis not present

## 2015-01-07 DIAGNOSIS — R001 Bradycardia, unspecified: Secondary | ICD-10-CM | POA: Diagnosis present

## 2015-01-07 DIAGNOSIS — R931 Abnormal findings on diagnostic imaging of heart and coronary circulation: Secondary | ICD-10-CM | POA: Diagnosis not present

## 2015-01-07 DIAGNOSIS — I70209 Unspecified atherosclerosis of native arteries of extremities, unspecified extremity: Secondary | ICD-10-CM

## 2015-01-07 DIAGNOSIS — R778 Other specified abnormalities of plasma proteins: Secondary | ICD-10-CM | POA: Diagnosis present

## 2015-01-07 DIAGNOSIS — Z8673 Personal history of transient ischemic attack (TIA), and cerebral infarction without residual deficits: Secondary | ICD-10-CM | POA: Diagnosis not present

## 2015-01-07 DIAGNOSIS — G459 Transient cerebral ischemic attack, unspecified: Secondary | ICD-10-CM | POA: Diagnosis not present

## 2015-01-07 HISTORY — DX: Dizziness and giddiness: R42

## 2015-01-07 HISTORY — DX: Hypo-osmolality and hyponatremia: E87.1

## 2015-01-07 HISTORY — DX: Unspecified atrial fibrillation: I48.91

## 2015-01-07 HISTORY — DX: Presence of other heart-valve replacement: Z95.4

## 2015-01-07 HISTORY — DX: Abnormal findings on diagnostic imaging of heart and coronary circulation: R93.1

## 2015-01-07 HISTORY — DX: Cerebral infarction, unspecified: I63.9

## 2015-01-07 LAB — ETHANOL: Alcohol, Ethyl (B): <5 mg/dL (ref <5)

## 2015-01-07 LAB — COMPREHENSIVE METABOLIC PANEL
ALBUMIN: 4.3 g/dL (ref 3.5–5.0)
ALT: 22 U/L (ref 14–54)
AST: 36 U/L (ref 15–41)
Alkaline Phosphatase: 76 U/L (ref 38–126)
Anion gap: 9 (ref 5–15)
BILIRUBIN TOTAL: 1.1 mg/dL (ref 0.3–1.2)
BUN: 11 mg/dL (ref 6–20)
CHLORIDE: 99 mmol/L — AB (ref 101–111)
CO2: 28 mmol/L (ref 22–32)
CREATININE: 0.83 mg/dL (ref 0.44–1.00)
Calcium: 9.3 mg/dL (ref 8.9–10.3)
GFR calc Af Amer: >60 mL/min (ref >60)
GLUCOSE: 119 mg/dL — AB (ref 65–99)
Potassium: 3.8 mmol/L (ref 3.5–5.1)
Sodium: 136 mmol/L (ref 135–145)
Total Protein: 7 g/dL (ref 6.5–8.1)

## 2015-01-07 LAB — URINE MICROSCOPIC-ADD ON

## 2015-01-07 LAB — URINALYSIS, ROUTINE W REFLEX MICROSCOPIC
Bilirubin Urine: NEGATIVE
Glucose, UA: NEGATIVE mg/dL
Hgb urine dipstick: NEGATIVE
Ketones, ur: NEGATIVE mg/dL
NITRITE: NEGATIVE
PH: 7 (ref 5.0–8.0)
Protein, ur: NEGATIVE mg/dL
SPECIFIC GRAVITY, URINE: 1.012 (ref 1.005–1.030)

## 2015-01-07 LAB — PROTIME-INR
INR: 1.82 — ABNORMAL HIGH (ref 0.00–1.49)
Prothrombin Time: 21 seconds — ABNORMAL HIGH (ref 11.6–15.2)

## 2015-01-07 LAB — RAPID URINE DRUG SCREEN, HOSP PERFORMED
Amphetamines: NOT DETECTED
BENZODIAZEPINES: POSITIVE — AB
Barbiturates: NOT DETECTED
Cocaine: NOT DETECTED
OPIATES: POSITIVE — AB
Tetrahydrocannabinol: NOT DETECTED

## 2015-01-07 LAB — DIGOXIN LEVEL: DIGOXIN LVL: 0.4 ng/mL — AB (ref 0.8–2.0)

## 2015-01-07 LAB — DIFFERENTIAL
BASOS ABS: 0 10*3/uL (ref 0.0–0.1)
Basophils Relative: 0 %
Eosinophils Absolute: 0.1 10*3/uL (ref 0.0–0.7)
Eosinophils Relative: 1 %
LYMPHS ABS: 1.2 10*3/uL (ref 0.7–4.0)
Lymphocytes Relative: 18 %
MONOS PCT: 9 %
Monocytes Absolute: 0.6 10*3/uL (ref 0.1–1.0)
NEUTROS ABS: 4.9 10*3/uL (ref 1.7–7.7)
NEUTROS PCT: 73 %

## 2015-01-07 LAB — CBC
HEMATOCRIT: 39.5 % (ref 36.0–46.0)
HEMOGLOBIN: 13.4 g/dL (ref 12.0–15.0)
MCH: 32.6 pg (ref 26.0–34.0)
MCHC: 33.9 g/dL (ref 30.0–36.0)
MCV: 96.1 fL (ref 78.0–100.0)
Platelets: 210 10*3/uL (ref 150–400)
RBC: 4.11 MIL/uL (ref 3.87–5.11)
RDW: 17 % — ABNORMAL HIGH (ref 11.5–15.5)
WBC: 6.7 10*3/uL (ref 4.0–10.5)

## 2015-01-07 LAB — I-STAT CHEM 8, ED
BUN: 10 mg/dL (ref 6–20)
CHLORIDE: 96 mmol/L — AB (ref 101–111)
CREATININE: 0.9 mg/dL (ref 0.44–1.00)
Calcium, Ion: 1.17 mmol/L (ref 1.13–1.30)
GLUCOSE: 114 mg/dL — AB (ref 65–99)
HCT: 43 % (ref 36.0–46.0)
Hemoglobin: 14.6 g/dL (ref 12.0–15.0)
POTASSIUM: 3.8 mmol/L (ref 3.5–5.1)
Sodium: 137 mmol/L (ref 135–145)
TCO2: 28 mmol/L (ref 0–100)

## 2015-01-07 LAB — APTT: APTT: 35 s (ref 24–37)

## 2015-01-07 LAB — I-STAT TROPONIN, ED: TROPONIN I, POC: 0.86 ng/mL — AB (ref 0.00–0.08)

## 2015-01-07 LAB — CBG MONITORING, ED: Glucose-Capillary: 123 mg/dL — ABNORMAL HIGH (ref 65–99)

## 2015-01-07 MED ORDER — HEPARIN (PORCINE) IN NACL 100-0.45 UNIT/ML-% IJ SOLN
1200.0000 [IU]/h | INTRAMUSCULAR | Status: DC
  Administered 2015-01-07: 700 [IU]/h via INTRAVENOUS
  Filled 2015-01-07 (×3): qty 250

## 2015-01-07 MED ORDER — IOHEXOL 350 MG/ML SOLN
80.0000 mL | Freq: Once | INTRAVENOUS | Status: AC | PRN
  Administered 2015-01-07: 100 mL via INTRAVENOUS

## 2015-01-07 MED ORDER — NICOTINE 7 MG/24HR TD PT24
7.0000 mg | MEDICATED_PATCH | Freq: Once | TRANSDERMAL | Status: DC
  Administered 2015-01-07: 7 mg via TRANSDERMAL
  Filled 2015-01-07: qty 1

## 2015-01-07 MED ORDER — ALPRAZOLAM 0.5 MG PO TABS
1.0000 mg | ORAL_TABLET | Freq: Once | ORAL | Status: AC
  Administered 2015-01-07: 1 mg via ORAL
  Filled 2015-01-07: qty 2

## 2015-01-07 NOTE — ED Notes (Signed)
Patient transported to CT 

## 2015-01-07 NOTE — ED Notes (Signed)
Pt now states that she had no weakness on her left side but that her left foot has been cold all day today after having a pain in her neck.

## 2015-01-07 NOTE — ED Notes (Addendum)
Doppler DP pulse in right foot, marked. Unable to doppler DP or PT pulse in left foot (left popliteal pulse obtained with doppler), EDP aware. 2nd RN to try to obtain doppler pulse in left foot. Cap refil < 3 seconds bilaterally, pt able to wiggle digits bilaterally.

## 2015-01-07 NOTE — ED Provider Notes (Addendum)
7:01 PM Patient evaluated on arrival from our affiliated facility. I evaluated the patient with our vascular surgeon concurrently. Patient was transferred for concern of possible arterial occlusion of the left lower extremity. Patient has faintly palpable pulse in the left dorsalis pedis, Refill is less than 3 seconds, symmetric. Patient has sensation preserved in the left foot. With concern for occlusion, vascular surgery recommendations CT angiography of the leg.  Given the patient's elevated troponin, subtherapeutic Coumadin, she will also start heparin drip.  <ECG> Cardiac monitor: 75 afib, abnormal  O 2- 99% ra, nml   9:51 PM Patient with heart rate in the 70s, remains in A. fib. She, her husband and son are aware of all results thus far. Patient is tolerating the heparin drip well. I reviewed the CT imaging with our radiologist. There is diffuse vasculopathy.  IMPRESSION: 1. Extensive popliteal and infrapopliteal atherosclerosis with 2 vessel runoff at the ankle. 2. Secondary findings of left lower extremity arterial inflow disease, as above.   Patient will require admission. Second troponin is pending.   10:45 PM Patient very anxious.  Xanax and nicotine patch provided. I discussed her case with our hospitalist colleagues for admission to step-down given the ongoing heparin drip.  Gerhard Munchobert Kendric Sindelar, MD 01/07/15 2246  CRITICAL CARE Performed by: Gerhard MunchLOCKWOOD, Khilynn Borntreger Total critical care time: 45 minutes Critical care time was exclusive of separately billable procedures and treating other patients. Critical care was necessary to treat or prevent imminent or life-threatening deterioration. Critical care was time spent personally by me on the following activities: development of treatment plan with patient and/or surrogate as well as nursing, discussions with consultants, evaluation of patient's response to treatment, examination of patient, obtaining history from patient or  surrogate, ordering and performing treatments and interventions, ordering and review of laboratory studies, ordering and review of radiographic studies, pulse oximetry and re-evaluation of patient's condition.   Gerhard Munchobert Derya Dettmann, MD 01/07/15 [phone]

## 2015-01-07 NOTE — Progress Notes (Signed)
CODE STROKE.  Beeper @ 16:38,  [facility].  Pt on CT scanner @ 16:48  ,exam finished @ 16:50.  [facility] spoke with Bjorn Loserhonda.

## 2015-01-07 NOTE — H&P (Addendum)
[facility]  Patient name: Jasmine West MRN: [id number] DOB: [birth year] Sex: female  REASON FOR CONSULT: Cool Left foot. Transfer from [facility]  HPI: Jasmine West is a 69 y.o. female, who presented to the [facility] ED this afternoon complaining of neck pain and pain on the left side of her body. She also noted that her left foot had been cold and weak. On my history, she was at church when she developed pain on the left side of her face and temporal area and also somewhat in her left neck. This lasted only briefly and then subsided. She went home. Her husband noted that her left foot was cool and they presented to the emergency department. Given her symptoms and her face, there was some concern that she might be having a stroke. She underwent a CT scan of the head which showed no evidence of acute stroke or hemorrhage. The emergency department at the referring hospital was concerned that the foot was cool and that she might have an ischemic foot and therefore she sent for vascular evaluation and transferred to [facility]. Of note, she has a history of atrial fibrillation. She is on Coumadin. Her INR is checked once a month and last time it was checked was 2.6. However in the emergency department at the referring hospital her INR was 1.8.  Prior to the events today she denies any history of claudication, rest pain, or nonhealing ulcers. Her activity is somewhat limited by her COPD and shortness of breath.  Risk factors for vascular disease and include a 1 pack per day smoking history for 40 years and hypertension. She denies any history of diabetes, hypercholesterolemia, or family history of premature cardiovascular disease.   Past Medical History  Diagnosis Date  . Brain aneurysm 10/20/13    3 on lt side and 1 on the right, most recent Oct 1  . Thyroid disease   . Hypertension   . COPD (chronic obstructive pulmonary disease) (HCC)   . Asthma   . Chronic  back pain   . Sciatica   . S/P tricuspid valve replacement   . Aortic valve replaced   . Hyponatremia   . Stroke [facility]     seen on CT scan in 2015  . Episodic lightheadedness     since cerebral aneurysm repair 10/2013    Family History  Problem Relation Age of Onset  . Cancer Sister   . Cancer Brother   . Kidney failure Father    There is no family history of premature cardiovascular disease.  SOCIAL HISTORY: Social History   Social History  . Marital Status: Married    Spouse Name: N/A  . Number of Children: N/A  . Years of Education: N/A   Occupational History  . Not on file.   Social History Main Topics  . Smoking status: Heavy Tobacco Smoker -- 1.00 packs/day for 40 years    Types: Cigarettes  . Smokeless tobacco: Never Used  . Alcohol Use: No  . Drug Use: No  . Sexual Activity: Not on file   Other Topics Concern  . Not on file   Social History Narrative    Allergies  Allergen Reactions  . Sulfa Antibiotics Photosensitivity    No current facility-administered medications for this encounter.   Current Outpatient Prescriptions  Medication Sig Dispense Refill  . albuterol (PROVENTIL) (2.5 MG/3ML) 0.083% nebulizer solution Take 2.5 mg by nebulization at bedtime.     Marland Kitchen albuterol (  VENTOLIN HFA) 108 (90 BASE) MCG/ACT inhaler Inhale 2 puffs into the lungs 4 (four) times daily.    Marland Kitchen ALPRAZolam (XANAX) 1 MG tablet Take 1 mg by mouth 2 (two) times daily as needed for anxiety.    Marland Kitchen COUMADIN 5 MG tablet Take 5-7.5 mg by mouth at bedtime. Patient takes 5 mg everyday besides on Monday when patient takes 7.5 mg    . digoxin (LANOXIN) 0.25 MG tablet Take 0.25 mg by mouth every morning.    . fluticasone (FLONASE) 50 MCG/ACT nasal spray Place 2 sprays into both nostrils every evening.     . furosemide (LASIX) 20 MG tablet Take 20 mg by mouth as needed for fluid.    Marland Kitchen guaiFENesin (MUCINEX) 600 MG 12 hr tablet Take 600 mg by mouth 2 (two) times daily.    Marland Kitchen guaiFENesin  (MUCINEX) 600 MG 12 hr tablet Take 600 mg by mouth 2 (two) times daily.    Marland Kitchen levothyroxine (SYNTHROID, LEVOTHROID) 50 MCG tablet Take 50 mcg by mouth at bedtime.     Marland Kitchen losartan (COZAAR) 50 MG tablet Take 50 mg by mouth every evening.    . metoprolol tartrate (LOPRESSOR) 25 MG tablet Take 12.5 mg by mouth every evening.    . montelukast (SINGULAIR) 10 MG tablet Take 10 mg by mouth every morning.    Marland Kitchen oxymetazoline (AFRIN) 0.05 % nasal spray Place 1 spray into both nostrils as needed for congestion.     . pantoprazole (PROTONIX) 40 MG tablet Take 2 tablets (80 mg total) by mouth daily. 60 tablet 0  . promethazine (PHENERGAN) 12.5 MG tablet Take 12.5 mg by mouth every 6 (six) hours as needed for nausea or vomiting.    Marland Kitchen Umeclidinium Bromide (INCRUSE ELLIPTA) 62.5 MCG/INH AEPB Inhale 1 puff into the lungs at bedtime.    . furosemide (LASIX) 40 MG tablet Take 1 tablet (40 mg total) by mouth daily. 4 tablet 0  . HYDROcodone-acetaminophen (NORCO/VICODIN) 5-325 MG per tablet Take 1-2 tablets by mouth every 4 (four) hours as needed. 5 tablet 0  . sucralfate (CARAFATE) 1 G tablet Take 1 tablet (1 g total) by mouth 4 (four) times daily. 60 tablet 0    REVIEW OF SYSTEMS:   denotes positive finding,  denotes negative finding Cardiac  Comments:  Chest pain or chest pressure:    Shortness of breath upon exertion: X   Short of breath when lying flat: X   Irregular heart rhythm: X History of atrial fibrillation      Vascular    Pain in calf, thigh, or hip brought on by ambulation:    Pain in feet at night that wakes you up from your [place]:     Blood clot in your veins:    Leg swelling:         Pulmonary    Oxygen at home:    Productive cough:  X   Wheezing:  X       Neurologic    Sudden weakness in arms or legs:     Sudden numbness in arms or legs:     Sudden onset of difficulty speaking or slurred speech:    Temporary loss of vision in one eye:     Problems with dizziness:           Gastrointestinal    Blood in stool:     Vomited blood:         Genitourinary    Burning when urinating:  Blood in urine:        Psychiatric    Major depression:         Hematologic    Bleeding problems:    Problems with blood clotting too easily:        Skin    Rashes or ulcers:        Constitutional    Fever or chills:      PHYSICAL EXAM: Filed Vitals:   01/07/15 1730 01/07/15 1745 01/07/15 1800 01/07/15 1805  BP: 126/57 145/62 169/69 169/69  Pulse: 45 71 67   Temp:    98 F (36.7 C)  TempSrc:      Resp: 19 17 12 14   Height:      Weight:      SpO2: 93% 92% 98% 98%    GENERAL: The patient is a well-nourished female, in no acute distress. The vital signs are documented above. CARDIAC: There is a regular rate and rhythm.  VASCULAR: I do not detect carotid bruits. On the left side, there is a palpable femoral and popliteal pulse. There is a dorsalis pedis signal with the Doppler which is monophasic. I cannot obtain a posterior tibial signal. On the right side, there is a palpable femoral and popliteal pulse. There is a biphasic posterior tibial signal on the right. I cannot obtain a dorsalis pedis signal on the right. Both feet are pink and there is normal venous filling. The toes on the left foot are slightly cool but are not mottled. PULMONARY: There is good air exchange bilaterally without wheezing or rales. ABDOMEN: Soft and non-tender with normal pitched bowel sounds.  MUSCULOSKELETAL: There are no major deformities or cyanosis. NEUROLOGIC: No focal weakness or paresthesias are detected. SKIN: There are no ulcers or rashes noted. PSYCHIATRIC: The patient has a normal affect.  DATA:  EKG at the referring hospital shows atrial fibrillation. Heart rate was 93.  Labs at the referring hospital show a hemoglobin of 13.4. Platelet count is 210,000. White blood cell count is 6.7. Creatinine is 0.83. Potassium is 3.8.  Troponin is elevated at 0.86.  Her INR is  subtherapeutic at 1.82.  MEDICAL ISSUES:  POSSIBLE ISCHEMIC LEFT FOOT:  On my exam, the left foot is pink and appears to be well perfused. The toes are slightly cooler on the left. 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 She has a posterior tibial signal on the right and a dorsalis pedis signal on the left. She is a smoker and likely has some underlying infrainguinal arterial occlusive disease. Although her Coumadin is slightly subtherapeutic, based on her exam and history I'm not convinced that she has had an acute arterial occlusion although this would certainly be possible given her history of atrial fibrillation. This reason I recommended a CT angiogram of the lower extremities. I would continue her heparin for now. However currently her left foot appears adequately perfused with good color, normal venous filling, and a dorsalis pedis signal with the Doppler. I do not think the etiology of her face and neck pain is clear and I will order a carotid duplex scan for tomorrow. In addition she has elevated troponin levels the etiology of which is not clear. I will follow.   Waverly Ferrariickson, Akiba Melfi [facility] [place] Beeper: [phone]  ADDENDUM: CT scan shows evidence of infrainguinal arterial occlusive disease but no evidence of an acute arterial occlusion.  Waverly Ferrarihristopher Ronie Fleeger, MD, FACS Beeper [phone] Office: [phone]

## 2015-01-07 NOTE — ED Notes (Signed)
Patient c/o sudden sharp pain in temple that radiates down neck and left side of body. Patient reports left leg being cold and weak. Patient reports some dizziness. Patient reports taking Lortab and xanax. Patient states husband mentioned slurred speech but husband states "she has just been speaking very low lately." Patient also reports hx of 4 stents in brain for brain aneurysm.

## 2015-01-07 NOTE — ED Provider Notes (Signed)
CSN: [id number]     Arrival date & time 01/07/15  1617 History   First MD Initiated Contact with Patient 01/07/15 1636     Chief Complaint  Patient presents with  . Code Stroke      The history is provided by the patient and the spouse. The history is limited by the condition of the patient.  Pt was seen at 1640. Per pt and her husband: Pt states she was sitting in church approximately noon when she stood and realized that her LLE "was weak" and "not acting right" when she walked. This was associated with headache, lightheadedness and slurred/slow speech. Pt states she is "worried about a stroke or something wrong with the stents in my brain." Denies any other complaints. Denies CP/palpitations, no SOB/cough, no abd pain, no N/V/D, no back pain, no facial droop, no visual changes, no tingling/numbness in extremities, no other focal motor weakness.    Past Medical History  Diagnosis Date  . Brain aneurysm 10/20/13    3 on lt side and 1 on the right, most recent Oct 1  . Thyroid disease   . Hypertension   . COPD (chronic obstructive pulmonary disease) (HCC)   . Asthma   . Chronic back pain   . Sciatica   . S/P tricuspid valve replacement   . Aortic valve replaced   . Hyponatremia   . Stroke [facility](HCC)     seen on CT scan in 2015  . Episodic lightheadedness     since cerebral aneurysm repair 10/2013   Past Surgical History  Procedure Laterality Date  . Brain surgery      aneurysm stent placed 2015  . Cardiac surgery    . Cholecystectomy    . Appendectomy     Family History  Problem Relation Age of Onset  . Cancer Sister   . Cancer Brother   . Kidney failure Father    Social History  Substance Use Topics  . Smoking status: Heavy Tobacco Smoker -- 1.00 packs/day for 40 years    Types: Cigarettes  . Smokeless tobacco: Never Used  . Alcohol Use: No    Review of Systems  Unable to perform ROS: Acuity of condition      Allergies  Sulfa antibiotics  Home Medications    Prior to Admission medications   Medication Sig Start Date End Date Taking? Authorizing Provider  promethazine (PHENERGAN) 12.5 MG tablet Take 12.5 mg by mouth every 6 (six) hours as needed for nausea or vomiting.   Yes Historical Provider, MD  albuterol (PROVENTIL) (2.5 MG/3ML) 0.083% nebulizer solution Take 2.5 mg by nebulization 2 (two) times daily.    Historical Provider, MD  albuterol (VENTOLIN HFA) 108 (90 BASE) MCG/ACT inhaler Inhale 2 puffs into the lungs 4 (four) times daily.    Historical Provider, MD  aspirin EC 81 MG tablet Take 81 mg by mouth every morning.    Historical Provider, MD  clonazePAM (KLONOPIN) 1 MG tablet Take 1 tablet by mouth 3 (three) times daily. 04/28/14   Historical Provider, MD  COUMADIN 5 MG tablet Take 1 tablet by mouth daily. Patient takes every day. 05/12/14   Historical Provider, MD  digoxin (LANOXIN) 0.25 MG tablet Take 0.25 mg by mouth every morning.    Historical Provider, MD  fluticasone (FLONASE) 50 MCG/ACT nasal spray Place 2 sprays into both nostrils every morning.    Historical Provider, MD  furosemide (LASIX) 40 MG tablet Take 1 tablet (40 mg total) by mouth daily. 06/07/14  Benjiman Core, MD  guaiFENesin (MUCINEX) 600 MG 12 hr tablet Take 600 mg by mouth 2 (two) times daily.    Historical Provider, MD  HYDROcodone-acetaminophen (NORCO/VICODIN) 5-325 MG per tablet Take 1-2 tablets by mouth every 4 (four) hours as needed. 06/07/14   Benjiman Core, MD  levothyroxine (SYNTHROID, LEVOTHROID) 50 MCG tablet Take 50 mcg by mouth every morning.    Historical Provider, MD  losartan (COZAAR) 50 MG tablet Take 50 mg by mouth every evening.    Historical Provider, MD  metoprolol tartrate (LOPRESSOR) 25 MG tablet Take 12.5 mg by mouth every evening.    Historical Provider, MD  montelukast (SINGULAIR) 10 MG tablet Take 10 mg by mouth every morning.    Historical Provider, MD  oxymetazoline (AFRIN) 0.05 % nasal spray Place 1 spray into both nostrils 2 (two)  times daily.    Historical Provider, MD  pantoprazole (PROTONIX) 40 MG tablet Take 2 tablets (80 mg total) by mouth daily. 05/13/14   Rolland Porter, MD  predniSONE (DELTASONE) 10 MG tablet Take 10 mg by mouth daily. For 7 days starting on 11/10/2013    Historical Provider, MD  sucralfate (CARAFATE) 1 G tablet Take 1 tablet (1 g total) by mouth 4 (four) times daily. 05/13/14   Rolland Porter, MD   BP 152/63 mmHg  Pulse 90  Temp(Src) 98 F (36.7 C) (Oral)  Resp 19  Ht  (1.702 m)  Wt 130 lb (58.968 kg)  BMI 20.36 kg/m2  SpO2 93% Physical Exam  1640: Physical examination:  Nursing notes reviewed; Vital signs and O2 SAT reviewed;  Constitutional: Well developed, Well nourished, Well hydrated. Very anxious, crying.; Head:  Normocephalic, atraumatic; Eyes: EOMI, PERRL, No scleral icterus; ENMT: Mouth and pharynx normal, Mucous membranes moist; Neck: Supple, Full range of motion, No lymphadenopathy; Cardiovascular: Irregular irregular rate and rhythm, No gallop; Respiratory: Breath sounds clear & equal bilaterally, No wheezes.  Speaking full sentences with ease, Normal respiratory effort/excursion; Chest: Nontender, Movement normal; Abdomen: Soft, Nontender, Nondistended, Normal bowel sounds; Genitourinary: No CVA tenderness; Extremities: No deformity, No tenderness, No edema, No calf edema or asymmetry.; Neuro: AA&Ox3, Major CN grossly intact. Major CN grossly intact. Speech clear.  No facial droop.  No nystagmus. Grips equal. Strength 5/5 equal bilat UE's and RLE, strength 3/5 LLE. +drift LLE.  DTR 2/4 equal bilat UE's and LE's.  No gross sensory deficits.  Normal cerebellar testing bilat UE's (finger-nose) and RLE (heel-shin), +ataxic LLE. ; Skin: Color normal, Warm, Dry.; Psych:  Very anxious, crying.     ED Course  Procedures (including critical care time) Labs Review   Imaging Review  I have personally reviewed and evaluated these images and lab results as part of my medical decision-making.    EKG Interpretation   Date/Time:  Sunday January 07 2015 16:40:05 EST Ventricular Rate:  93 PR Interval:    QRS Duration: 113 QT Interval:  385 QTC Calculation: 479 R Axis:   80 Text Interpretation:  Atrial fibrillation Ventricular premature complex  Borderline low voltage, extremity leads Probable anteroseptal infarct, old  T wave abnormality Lateral leads When compared with ECG of 06/07/2014 T  wave abnormality Lateral leads is more pronounced Premature ventricular  complexes are now Present Confirmed by [facility]  MD, Nicholos Johns [phone]) on  01/07/2015 4:56:47 PM      MDM  MDM Reviewed: previous chart, vitals and nursing note Reviewed previous: labs and ECG Interpretation: labs, ECG and CT scan Total time providing critical care: 30-74 minutes. This  excludes time spent performing separately reportable procedures and services. Consults: neurology (Vascular Surgery)     CRITICAL CARE Performed by: Laray Anger Total critical care time: 35 minutes Critical care time was exclusive of separately billable procedures and treating other patients. Critical care was necessary to treat or prevent imminent or life-threatening deterioration. Critical care was time spent personally by me on the following activities: development of treatment plan with patient and/or surrogate as well as nursing, discussions with consultants, evaluation of patient's response to treatment, examination of patient, obtaining history from patient or surrogate, ordering and performing treatments and interventions, ordering and review of laboratory studies, ordering and review of radiographic studies, pulse oximetry and re-evaluation of patient's condition.  Results for orders placed or performed during the hospital encounter of 01/07/15  Ethanol  Result Value Ref Range   Alcohol, Ethyl (B) <5 <5 mg/dL  Protime-INR  Result Value Ref Range   Prothrombin Time 21.0 (H) 11.6 - 15.2 seconds   INR 1.82 (H) 0.00 -  1.49  APTT  Result Value Ref Range   aPTT 35 24 - 37 seconds  CBC  Result Value Ref Range   WBC 6.7 4.0 - 10.5 K/uL   RBC 4.11 3.87 - 5.11 MIL/uL   Hemoglobin 13.4 12.0 - 15.0 g/dL   HCT 16.1 09.6 - 04.5 %   MCV 96.1 78.0 - 100.0 fL   MCH 32.6 26.0 - 34.0 pg   MCHC 33.9 30.0 - 36.0 g/dL   RDW 40.9 (H) 81.1 - 91.4 %   Platelets 210 150 - 400 K/uL  Differential  Result Value Ref Range   Neutrophils Relative % 73 %   Neutro Abs 4.9 1.7 - 7.7 K/uL   Lymphocytes Relative 18 %   Lymphs Abs 1.2 0.7 - 4.0 K/uL   Monocytes Relative 9 %   Monocytes Absolute 0.6 0.1 - 1.0 K/uL   Eosinophils Relative 1 %   Eosinophils Absolute 0.1 0.0 - 0.7 K/uL   Basophils Relative 0 %   Basophils Absolute 0.0 0.0 - 0.1 K/uL  Comprehensive metabolic panel  Result Value Ref Range   Sodium 136 135 - 145 mmol/L   Potassium 3.8 3.5 - 5.1 mmol/L   Chloride 99 (L) 101 - 111 mmol/L   CO2 28 22 - 32 mmol/L   Glucose, Bld 119 (H) 65 - 99 mg/dL   BUN 11 6 - 20 mg/dL   Creatinine, Ser 7.82 0.44 - 1.00 mg/dL   Calcium 9.3 8.9 - 95.6 mg/dL   Total Protein 7.0 6.5 - 8.1 g/dL   Albumin 4.3 3.5 - 5.0 g/dL   AST 36 15 - 41 U/L   ALT 22 14 - 54 U/L   Alkaline Phosphatase 76 38 - 126 U/L   Total Bilirubin 1.1 0.3 - 1.2 mg/dL   GFR calc non Af Amer >60 >60 mL/min   GFR calc Af Amer >60 >60 mL/min   Anion gap 9 5 - 15  CBG monitoring, ED  Result Value Ref Range   Glucose-Capillary 123 (H) 65 - 99 mg/dL  I-Stat Chem 8, ED  (not at [facility], [facility])  Result Value Ref Range   Sodium 137 135 - 145 mmol/L   Potassium 3.8 3.5 - 5.1 mmol/L   Chloride 96 (L) 101 - 111 mmol/L   BUN 10 6 - 20 mg/dL   Creatinine, Ser 2.13 0.44 - 1.00 mg/dL   Glucose, Bld 086 (H) 65 - 99 mg/dL   Calcium, Ion 5.78 4.69 -  1.30 mmol/L   TCO2 28 0 - 100 mmol/L   Hemoglobin 14.6 12.0 - 15.0 g/dL   HCT 16.1 09.6 - 04.5 %  I-stat troponin, ED (not at [facility], [facility])  Result Value Ref Range   Troponin i, poc 0.86 (HH) 0.00 - 0.08 ng/mL   Comment 3            Ct Head Wo Contrast 01/07/2015  CLINICAL DATA:  Headache with left leg weakness today. On anticoagulation. History of brain aneurysm. Code stroke. Initial encounter. EXAM: CT HEAD WITHOUT CONTRAST TECHNIQUE: Contiguous axial images were obtained from the base of the skull through the vertex without intravenous contrast. COMPARISON:  Head CT 11/11/2013. FINDINGS: There is no evidence of acute intracranial hemorrhage, mass lesion, brain edema or extra-axial fluid collection. The ventricles and subarachnoid spaces are appropriately sized for age. There is no CT evidence of acute cortical infarction. There is chronic subcortical encephalomalacia in the posterior right frontal lobe. 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 There is also an old lacunar infarct anteriorly in the left caudate body. Intracranial vascular calcifications are noted. The visualized paranasal sinuses, mastoid air cells and middle ears are clear. The calvarium is intact. TMJ degenerative changes are present bilaterally. IMPRESSION: Stable head CT demonstrating chronic small vessel ischemic changes, but no CT evidence of acute stroke or acute hemorrhage. These results were called by telephone at the time of interpretation on 01/07/2015 at 5:02 pm to Dr. Samuel Jester , who verbally acknowledged these results. Electronically Signed   By: Carey Bullocks M.D.   On: 01/07/2015 17:04     1705:  On arrival to ED, pt's c/o to ED RN and myself: LLE weakness, slurred/slow speech and headache, as well as lightheadedness. Code Stroke called. During my initial exam: pt with LLE 3/5 weakness, ataxia and drift. Pt denied any other complaints; specifically any differing sensations of her LLE compared to her other extremities. Rads and RN staff state pt "wanted to walk" in CT scan, and also denied any change in sensation of her LLE to them, only complaining of weakness in her LLE. T/C from [facility] Neuro who completed his evaluation of pt:  states NIH score 0 for him and pt can be admitted for non-emergent MRI brain, he then states that pt's only complaint to him was that her left foot "felt cold."  ED RN and I re-evaluated pt:  pt now states she "forgot" to mention her LLE feeling cooler than her right "because I was so upset and thought I was having a stroke or something was wrong with my brain stents." Pt's husband states to ED RN staff that "she needs to work on her emotions."  LLE from mid-tibial area to foot cooler than right, +palp left femoral pulse, +palp left popliteal pulse, unable to palp or doppler left PT or DP pulses. LLE now with FROM, 5/5 strength. No open wounds, no rash, no change in color of left lower leg/foot compared to right. RLE: warm, good color, +right femoral pulse, +right popliteal pulse, +right PT and DP pulses are present with doppler. INR subtherapeutic, IV heparin ordered (CT head negative as above).    1740:  T/C to [facility] Vascular Surgery Dr. Edilia Bo, case discussed, including:  HPI, pertinent PM/SHx, VS/PE, dx testing, ED course and treatment:  Requests to transport to [facility] ED where he will see pt. [facility] EDP Dr. Jodi Mourning given report.       Samuel Jester, DO 01/09/15 2054

## 2015-01-07 NOTE — Progress Notes (Signed)
ANTICOAGULATION CONSULT NOTE - Initial Consult  Pharmacy Consult for Heparin Indication: Arterial Occlusion of LLE  Allergies  Allergen Reactions  . Sulfa Antibiotics Photosensitivity    Patient Measurements: Height: 5\' 7"  (170.2 cm) Weight: 130 lb (58.968 kg) IBW/kg (Calculated) : 61.6 Heparin Dosing Weight: 59kg  Vital Signs: Temp: 98 F (36.7 C) (12/18 1805) Temp Source: Oral (12/18 1639) BP: 169/69 mmHg (12/18 1805) Pulse Rate: 67 (12/18 1800)  Labs:  Recent Labs  01/07/15 1638 01/07/15 1646  HGB 13.4 14.6  HCT 39.5 43.0  PLT 210  --   APTT 35  --   LABPROT 21.0*  --   INR 1.82*  --   CREATININE 0.83 0.90    Estimated Creatinine Clearance: 55.7 mL/min (by C-G formula based on Cr of 0.9).   Medical History: Past Medical History  Diagnosis Date  . Brain aneurysm 10/20/13    3 on lt side and 1 on the right, most recent Oct 1  . Thyroid disease   . Hypertension   . COPD (chronic obstructive pulmonary disease) (HCC)   . Asthma   . Chronic back pain   . Sciatica   . S/P tricuspid valve replacement   . Aortic valve replaced   . Hyponatremia   . Stroke [facility](HCC)     seen on CT scan in 2015  . Episodic lightheadedness     since cerebral aneurysm repair 10/2013    Medications:  Coumadin at Home  Assessment: 69 yo presents with possible code stroke initially and appears CT okay. Also, patient had numbness in lower left extremity. Pt on chronic coumadin for Brain stents and subtherapeutic at 1.89. To start heparin tx. [facility] and be evaluated by vascular surgery  Goal of Therapy:  Heparin level 0.3-0.7 units/ml Monitor platelets by anticoagulation protocol: Yes   Plan:  Start heparin infusion at 700 units/hr Check anti-Xa level in 6 hours and daily while on heparin Continue to monitor H&H and platelets  Elder CyphersLorie Deward Sebek, BS Loura BackPharm D, BCPS Clinical Pharmacist Pager [phone]  01/07/2015,6:20 PM

## 2015-01-08 ENCOUNTER — Inpatient Hospital Stay (HOSPITAL_COMMUNITY): Payer: Medicare Other

## 2015-01-08 ENCOUNTER — Encounter (HOSPITAL_COMMUNITY): Payer: Self-pay | Admitting: Internal Medicine

## 2015-01-08 DIAGNOSIS — J438 Other emphysema: Secondary | ICD-10-CM

## 2015-01-08 DIAGNOSIS — E039 Hypothyroidism, unspecified: Secondary | ICD-10-CM

## 2015-01-08 DIAGNOSIS — I481 Persistent atrial fibrillation: Secondary | ICD-10-CM

## 2015-01-08 DIAGNOSIS — R7989 Other specified abnormal findings of blood chemistry: Secondary | ICD-10-CM

## 2015-01-08 DIAGNOSIS — Z720 Tobacco use: Secondary | ICD-10-CM

## 2015-01-08 DIAGNOSIS — R791 Abnormal coagulation profile: Secondary | ICD-10-CM | POA: Diagnosis present

## 2015-01-08 DIAGNOSIS — I214 Non-ST elevation (NSTEMI) myocardial infarction: Principal | ICD-10-CM

## 2015-01-08 DIAGNOSIS — G459 Transient cerebral ischemic attack, unspecified: Secondary | ICD-10-CM

## 2015-01-08 DIAGNOSIS — I10 Essential (primary) hypertension: Secondary | ICD-10-CM | POA: Diagnosis present

## 2015-01-08 DIAGNOSIS — Z954 Presence of other heart-valve replacement: Secondary | ICD-10-CM

## 2015-01-08 DIAGNOSIS — I4891 Unspecified atrial fibrillation: Secondary | ICD-10-CM | POA: Diagnosis present

## 2015-01-08 HISTORY — DX: Presence of other heart-valve replacement: Z95.4

## 2015-01-08 LAB — CBC WITH DIFFERENTIAL/PLATELET
Basophils Absolute: 0 10*3/uL (ref 0.0–0.1)
Basophils Relative: 0 %
EOS ABS: 0.1 10*3/uL (ref 0.0–0.7)
Eosinophils Relative: 2 %
HEMATOCRIT: 37.3 % (ref 36.0–46.0)
HEMOGLOBIN: 12.5 g/dL (ref 12.0–15.0)
LYMPHS ABS: 1.4 10*3/uL (ref 0.7–4.0)
LYMPHS PCT: 22 %
MCH: 32.4 pg (ref 26.0–34.0)
MCHC: 33.5 g/dL (ref 30.0–36.0)
MCV: 96.6 fL (ref 78.0–100.0)
MONOS PCT: 8 %
Monocytes Absolute: 0.5 10*3/uL (ref 0.1–1.0)
NEUTROS PCT: 68 %
Neutro Abs: 4.2 10*3/uL (ref 1.7–7.7)
Platelets: 187 10*3/uL (ref 150–400)
RBC: 3.86 MIL/uL — ABNORMAL LOW (ref 3.87–5.11)
RDW: 17.2 % — ABNORMAL HIGH (ref 11.5–15.5)
WBC: 6.1 10*3/uL (ref 4.0–10.5)

## 2015-01-08 LAB — COMPREHENSIVE METABOLIC PANEL
ALK PHOS: 63 U/L (ref 38–126)
ALT: 19 U/L (ref 14–54)
ANION GAP: 6 (ref 5–15)
AST: 41 U/L (ref 15–41)
Albumin: 3.5 g/dL (ref 3.5–5.0)
BILIRUBIN TOTAL: 1.1 mg/dL (ref 0.3–1.2)
BUN: 6 mg/dL (ref 6–20)
CALCIUM: 8.6 mg/dL — AB (ref 8.9–10.3)
CO2: 25 mmol/L (ref 22–32)
CREATININE: 0.76 mg/dL (ref 0.44–1.00)
Chloride: 103 mmol/L (ref 101–111)
Glucose, Bld: 100 mg/dL — ABNORMAL HIGH (ref 65–99)
Potassium: 4 mmol/L (ref 3.5–5.1)
SODIUM: 134 mmol/L — AB (ref 135–145)
TOTAL PROTEIN: 5.6 g/dL — AB (ref 6.5–8.1)

## 2015-01-08 LAB — MRSA PCR SCREENING: MRSA by PCR: NEGATIVE

## 2015-01-08 LAB — HEPARIN LEVEL (UNFRACTIONATED)
HEPARIN UNFRACTIONATED: 0.2 [IU]/mL — AB (ref 0.30–0.70)
Heparin Unfractionated: <0.1 IU/mL — ABNORMAL LOW (ref 0.30–0.70)
Heparin Unfractionated: 0.31 IU/mL (ref 0.30–0.70)

## 2015-01-08 LAB — TROPONIN I
TROPONIN I: 3.73 ng/mL — AB (ref <0.031)
Troponin I: 4.43 ng/mL (ref <0.031)
Troponin I: 4.35 ng/mL (ref <0.031)

## 2015-01-08 LAB — PROTIME-INR
INR: 1.83 — AB (ref 0.00–1.49)
PROTHROMBIN TIME: 21.1 s — AB (ref 11.6–15.2)

## 2015-01-08 MED ORDER — PROMETHAZINE HCL 25 MG PO TABS
12.5000 mg | ORAL_TABLET | Freq: Four times a day (QID) | ORAL | Status: DC | PRN
Start: 2015-01-08 — End: 2015-01-13
  Filled 2015-01-08: qty 1

## 2015-01-08 MED ORDER — LIDOCAINE 5 % EX PTCH
1.0000 | MEDICATED_PATCH | CUTANEOUS | Status: DC
  Administered 2015-01-08 – 2015-01-12 (×4): 1 via TRANSDERMAL
  Filled 2015-01-08 (×5): qty 1

## 2015-01-08 MED ORDER — GUAIFENESIN ER 600 MG PO TB12
600.0000 mg | ORAL_TABLET | Freq: Two times a day (BID) | ORAL | Status: DC
  Administered 2015-01-08 – 2015-01-13 (×11): 600 mg via ORAL
  Filled 2015-01-08 (×14): qty 1

## 2015-01-08 MED ORDER — SODIUM CHLORIDE 0.9 % IV SOLN: 250.0000 mL | INTRAVENOUS | Status: DC | PRN

## 2015-01-08 MED ORDER — SUCRALFATE 1 G PO TABS
1.0000 g | ORAL_TABLET | Freq: Four times a day (QID) | ORAL | Status: DC
  Administered 2015-01-08 – 2015-01-13 (×17): 1 g via ORAL
  Filled 2015-01-08 (×17): qty 1

## 2015-01-08 MED ORDER — OXYMETAZOLINE HCL 0.05 % NA SOLN
1.0000 | NASAL | Status: DC | PRN
  Administered 2015-01-10 – 2015-01-13 (×2): 1 via NASAL
  Filled 2015-01-08: qty 15

## 2015-01-08 MED ORDER — IPRATROPIUM-ALBUTEROL 0.5-2.5 (3) MG/3ML IN SOLN
3.0000 mL | Freq: Four times a day (QID) | RESPIRATORY_TRACT | Status: DC
Start: 2015-01-08 — End: 2015-01-08
  Administered 2015-01-08: 3 mL via RESPIRATORY_TRACT
  Filled 2015-01-08: qty 3

## 2015-01-08 MED ORDER — WARFARIN SODIUM 5 MG PO TABS: 10.0000 mg | ORAL_TABLET | Freq: Once | ORAL | Status: DC

## 2015-01-08 MED ORDER — LEVOTHYROXINE SODIUM 50 MCG PO TABS
50.0000 ug | ORAL_TABLET | Freq: Every day | ORAL | Status: DC
  Administered 2015-01-08 – 2015-01-12 (×6): 50 ug via ORAL
  Filled 2015-01-08 (×7): qty 1

## 2015-01-08 MED ORDER — SODIUM CHLORIDE 0.9 % WEIGHT BASED INFUSION
1.0000 mL/kg/h | INTRAVENOUS | Status: DC
  Administered 2015-01-08 – 2015-01-09 (×2): 1 mL/kg/h via INTRAVENOUS

## 2015-01-08 MED ORDER — METOPROLOL TARTRATE 12.5 MG HALF TABLET
12.5000 mg | ORAL_TABLET | Freq: Every evening | ORAL | Status: DC
  Administered 2015-01-08 – 2015-01-11 (×4): 12.5 mg via ORAL
  Filled 2015-01-08 (×4): qty 1

## 2015-01-08 MED ORDER — INFLUENZA VAC SPLIT QUAD 0.5 ML IM SUSY
0.5000 mL | PREFILLED_SYRINGE | INTRAMUSCULAR | Status: AC
  Administered 2015-01-09: 0.5 mL via INTRAMUSCULAR
  Filled 2015-01-08: qty 0.5

## 2015-01-08 MED ORDER — PNEUMOCOCCAL VAC POLYVALENT 25 MCG/0.5ML IJ INJ
0.5000 mL | INJECTION | INTRAMUSCULAR | Status: AC
  Administered 2015-01-09: 0.5 mL via INTRAMUSCULAR
  Filled 2015-01-08: qty 0.5

## 2015-01-08 MED ORDER — NICOTINE 21 MG/24HR TD PT24
21.0000 mg | MEDICATED_PATCH | Freq: Every day | TRANSDERMAL | Status: DC
  Administered 2015-01-08 – 2015-01-13 (×6): 21 mg via TRANSDERMAL
  Filled 2015-01-08 (×6): qty 1

## 2015-01-08 MED ORDER — WARFARIN - PHARMACIST DOSING INPATIENT: Freq: Every day | Status: DC

## 2015-01-08 MED ORDER — SODIUM CHLORIDE 0.9 % IJ SOLN
3.0000 mL | Freq: Two times a day (BID) | INTRAMUSCULAR | Status: DC
  Administered 2015-01-08 – 2015-01-09 (×2): 3 mL via INTRAVENOUS

## 2015-01-08 MED ORDER — FUROSEMIDE 40 MG PO TABS: 20.0000 mg | ORAL_TABLET | ORAL | Status: DC | PRN

## 2015-01-08 MED ORDER — SODIUM CHLORIDE 0.9 % IJ SOLN: 3.0000 mL | INTRAMUSCULAR | Status: DC | PRN

## 2015-01-08 MED ORDER — FLUTICASONE PROPIONATE 50 MCG/ACT NA SUSP
2.0000 | Freq: Every evening | NASAL | Status: DC
  Administered 2015-01-08 – 2015-01-12 (×6): 2 via NASAL
  Filled 2015-01-08: qty 16

## 2015-01-08 MED ORDER — DOCUSATE SODIUM 100 MG PO CAPS
100.0000 mg | ORAL_CAPSULE | Freq: Two times a day (BID) | ORAL | Status: DC
  Administered 2015-01-08 – 2015-01-13 (×10): 100 mg via ORAL
  Filled 2015-01-08 (×11): qty 1

## 2015-01-08 MED ORDER — ALPRAZOLAM 0.5 MG PO TABS
1.0000 mg | ORAL_TABLET | Freq: Two times a day (BID) | ORAL | Status: DC | PRN
  Administered 2015-01-08 – 2015-01-11 (×7): 1 mg via ORAL
  Filled 2015-01-08 (×8): qty 2

## 2015-01-08 MED ORDER — ONDANSETRON HCL 4 MG PO TABS
4.0000 mg | ORAL_TABLET | Freq: Four times a day (QID) | ORAL | Status: DC | PRN
  Administered 2015-01-08: 4 mg via ORAL
  Filled 2015-01-08: qty 1

## 2015-01-08 MED ORDER — IOHEXOL 300 MG/ML  SOLN: 50.0000 mL | Freq: Once | INTRAMUSCULAR | Status: DC | PRN

## 2015-01-08 MED ORDER — ONDANSETRON HCL 4 MG/2ML IJ SOLN
4.0000 mg | Freq: Four times a day (QID) | INTRAMUSCULAR | Status: DC | PRN
  Administered 2015-01-12: 4 mg via INTRAVENOUS
  Filled 2015-01-08: qty 2

## 2015-01-08 MED ORDER — IPRATROPIUM-ALBUTEROL 0.5-2.5 (3) MG/3ML IN SOLN
3.0000 mL | Freq: Four times a day (QID) | RESPIRATORY_TRACT | Status: DC | PRN
  Administered 2015-01-09 – 2015-01-10 (×2): 3 mL via RESPIRATORY_TRACT
  Filled 2015-01-08 (×2): qty 3

## 2015-01-08 MED ORDER — LOSARTAN POTASSIUM 50 MG PO TABS
50.0000 mg | ORAL_TABLET | Freq: Every evening | ORAL | Status: DC
  Administered 2015-01-08 – 2015-01-12 (×6): 50 mg via ORAL
  Filled 2015-01-08 (×7): qty 1

## 2015-01-08 MED ORDER — UMECLIDINIUM BROMIDE 62.5 MCG/INH IN AEPB
1.0000 | INHALATION_SPRAY | Freq: Every day | RESPIRATORY_TRACT | Status: DC
  Administered 2015-01-08 – 2015-01-12 (×5): 1 via RESPIRATORY_TRACT

## 2015-01-08 MED ORDER — DIGOXIN 250 MCG PO TABS
0.2500 mg | ORAL_TABLET | Freq: Every morning | ORAL | Status: DC
  Administered 2015-01-08: 0.25 mg via ORAL
  Filled 2015-01-08: qty 1

## 2015-01-08 MED ORDER — MONTELUKAST SODIUM 10 MG PO TABS
10.0000 mg | ORAL_TABLET | Freq: Every morning | ORAL | Status: DC
  Administered 2015-01-08 – 2015-01-13 (×6): 10 mg via ORAL
  Filled 2015-01-08 (×7): qty 1

## 2015-01-08 MED ORDER — ASPIRIN 81 MG PO CHEW
81.0000 mg | CHEWABLE_TABLET | ORAL | Status: AC
  Administered 2015-01-09: 81 mg via ORAL
  Filled 2015-01-08: qty 1

## 2015-01-08 NOTE — Progress Notes (Signed)
ANTICOAGULATION CONSULT NOTE - Follow-up Consult  Pharmacy Consult for Heparin and warfarin Indication: r/o Arterial Occlusion of LLE, afib  Allergies  Allergen Reactions  . Sulfa Antibiotics Photosensitivity    Patient Measurements: Height: 5\' 7"  (170.2 cm) Weight: 132 lb 9.6 oz (60.147 kg) IBW/kg (Calculated) : 61.6 Heparin Dosing Weight: 59kg  Vital Signs: Temp: 97.7 F (36.5 C) (12/19 1715) Temp Source: Oral (12/19 1715) BP: 145/75 mmHg (12/19 1715) Pulse Rate: 83 (12/19 1715)  Labs:  Recent Labs  01/07/15 1638 01/07/15 1646 01/08/15 0228 01/08/15 0631 01/08/15 0735 01/08/15 1000 01/08/15 1332 01/08/15 1657  HGB 13.4 14.6 12.5  --   --   --   --   --   HCT 39.5 43.0 37.3  --   --   --   --   --   PLT 210  --  187  --   --   --   --   --   APTT 35  --   --   --   --   --   --   --   LABPROT 21.0*  --   --  21.1*  --   --   --   --   INR 1.82*  --   --  1.83*  --   --   --   --   HEPARINUNFRC  --   --  <0.10*  --   --  0.20*  --  0.31  CREATININE 0.83 0.90 0.76  --   --   --   --   --   TROPONINI  --   --  3.73*  --  4.43*  --  4.35*  --     Estimated Creatinine Clearance: 63.9 mL/min (by C-G formula based on Cr of 0.76).  Assessment: HL low this morning at 0.2 units/mL and rate increased to 1050 units/hr,  Follow up HL therapeutic at 0.31. No bleeding noted. Coumadin that was ordered earlier has been cancelled by Dr. Anne FuSkains for cardiac cath tomorrow.  Goal of Therapy:  INR 2-3 Heparin level 0.3-0.7 units/ml Monitor platelets by anticoagulation protocol: Yes   Plan:  -Increase heparin infusion to 1100 units/hr -coumadin 10 mg ordered cancelled by Dr. Anne FuSkains b/c pt for cardiac cath tomorrow -Daily heparin level, INR, and CBC  Herby AbrahamMichelle T. Zannah Melucci, Pharm.D. [phone] 01/08/2015 5:54 PM

## 2015-01-08 NOTE — ED Notes (Signed)
MD paged and made aware of Troponin-4.43.

## 2015-01-08 NOTE — Consult Note (Addendum)
Patient ID: Jasmine West MRN: [id number], DOB/AGE: 68-May-1947   Admit date: 01/07/2015   Primary Physician: Elise Benne, MD Primary Cardiologist:  Dr. Ave Filter [place] (Jul. 14, 2015 - Present) [phone] (Work)   Reason for Consult: Abnormal Troponin  Problem List  Past Medical History  Diagnosis Date  . Brain aneurysm 10/20/13    3 on lt side and 1 on the right, most recent Oct 1  . Thyroid disease   . Hypertension   . COPD (chronic obstructive pulmonary disease) (HCC)   . Asthma   . Chronic back pain   . Sciatica   . Mitral valve replaced   . Aortic valve replaced   . Hyponatremia   . Stroke [facility])     seen on CT scan in 2015  . Episodic lightheadedness     since cerebral aneurysm repair 10/2013    Past Surgical History  Procedure Laterality Date  . Brain surgery      aneurysm stent placed 2015  . Cardiac surgery    . Cholecystectomy    . Appendectomy       Allergies  Allergies  Allergen Reactions  . Sulfa Antibiotics Photosensitivity    HPI  The patient is a 69 y/o female with a PMH significant for both mechanical mitral valve/aortic valve replacement in 1995 at [facility], atrial fibrillation on chronic coumadin, prior CVA, h/o brain aneurysm s/p 4 brain stent placements, HTN, HLD, tobacco abuse and thyroid disease. She receives cardiovascular care from Dr. Ave Filter in [place].   She initially reported to [facility] 01/07/15 with complaints of left-sided head/neck/face and extremity pain associated with mild dizziness and nausea. CT of head was negative. She also noted severe left leg pain. Her husband noted her leg felt cold. There was concern for possible ischemic left foot. Subsequently vascular surgery was consulted. Given her slightly subtherapeutic INR, decision was made to perform a CT angiogram to rule out occlusive disease. This showed extensive popliteal and infrapopliteal atherosclerosis with 2  vessel runoff at the ankle, but no acute arterial occlusion.   Additional workup included checking a troponin level which was initially abnormal at 3.73. F/u troponin has further increased to 4.43. INR is subtherapeutic at 1.83. She denies any current or recent chest pain. EKG shows old anterior infarct. No acute ST changes. She denies any prior known h/o CAD/ MI.    Home Medications  Prior to Admission medications   Medication Sig Start Date End Date Taking? Authorizing Provider  albuterol (PROVENTIL) (2.5 MG/3ML) 0.083% nebulizer solution Take 2.5 mg by nebulization at bedtime.    Yes Historical Provider, MD  albuterol (VENTOLIN HFA) 108 (90 BASE) MCG/ACT inhaler Inhale 2 puffs into the lungs 4 (four) times daily.   Yes Historical Provider, MD  ALPRAZolam Prudy Feeler) 1 MG tablet Take 1 mg by mouth 2 (two) times daily as needed for anxiety.   Yes Historical Provider, MD  COUMADIN 5 MG tablet Take 5-7.5 mg by mouth at bedtime. Patient takes 5 mg everyday besides on Monday when patient takes 7.5 mg 05/12/14  Yes Historical Provider, MD  digoxin (LANOXIN) 0.25 MG tablet Take 0.25 mg by mouth every morning.   Yes Historical Provider, MD  fluticasone (FLONASE) 50 MCG/ACT nasal spray Place 2 sprays into both nostrils every evening.    Yes Historical Provider, MD  furosemide (LASIX) 20 MG tablet Take 20 mg by mouth as needed for fluid.   Yes Historical Provider, MD  guaiFENesin (MUCINEX) 600 MG 12 hr tablet Take 600 mg by mouth 2 (two) times daily.   Yes Historical Provider, MD  guaiFENesin (MUCINEX) 600 MG 12 hr tablet Take 600 mg by mouth 2 (two) times daily.   Yes Historical Provider, MD  levothyroxine (SYNTHROID, LEVOTHROID) 50 MCG tablet Take 50 mcg by mouth at bedtime.    Yes Historical Provider, MD  losartan (COZAAR) 50 MG tablet Take 50 mg by mouth every evening.   Yes Historical Provider, MD  metoprolol tartrate (LOPRESSOR) 25 MG tablet Take 12.5 mg by mouth every evening.   Yes Historical  Provider, MD  montelukast (SINGULAIR) 10 MG tablet Take 10 mg by mouth every morning.   Yes Historical Provider, MD  oxymetazoline (AFRIN) 0.05 % nasal spray Place 1 spray into both nostrils as needed for congestion.    Yes Historical Provider, MD  pantoprazole (PROTONIX) 40 MG tablet Take 2 tablets (80 mg total) by mouth daily. 05/13/14  Yes Rolland Porter, MD  promethazine (PHENERGAN) 12.5 MG tablet Take 12.5 mg by mouth every 6 (six) hours as needed for nausea or vomiting.   Yes Historical Provider, MD  Umeclidinium Bromide (INCRUSE ELLIPTA) 62.5 MCG/INH AEPB Inhale 1 puff into the lungs at bedtime.   Yes Historical Provider, MD  furosemide (LASIX) 40 MG tablet Take 1 tablet (40 mg total) by mouth daily. 06/07/14   Benjiman Core, MD  HYDROcodone-acetaminophen (NORCO/VICODIN) 5-325 MG per tablet Take 1-2 tablets by mouth every 4 (four) hours as needed. 06/07/14   Benjiman Core, MD  sucralfate (CARAFATE) 1 G tablet Take 1 tablet (1 g total) by mouth 4 (four) times daily. 05/13/14   Rolland Porter, MD    Family History  Family History  Problem Relation Age of Onset  . Cancer Sister   . Cancer Brother   . Kidney failure Father     Social History  Social History   Social History  . Marital Status: Married    Spouse Name: N/A  . Number of Children: N/A  . Years of Education: N/A   Occupational History  . Not on file.   Social History Main Topics  . Smoking status: Heavy Tobacco Smoker -- 1.00 packs/day for 40 years    Types: Cigarettes  . Smokeless tobacco: Never Used  . Alcohol Use: No  . Drug Use: No  . Sexual Activity: Not on file   Other Topics Concern  . Not on file   Social History Narrative     Review of Systems General:  No chills, fever, night sweats or weight changes.  Cardiovascular:  No chest pain, dyspnea on exertion, edema, orthopnea, palpitations, paroxysmal nocturnal dyspnea. Dermatological: No rash, lesions/masses Respiratory: No cough, dyspnea Urologic: No  hematuria, dysuria Abdominal:   No nausea, vomiting, diarrhea, bright red blood per rectum, melena, or hematemesis Neurologic:  No visual changes, wkns, changes in mental status. All other systems reviewed and are otherwise negative except as noted above.  Physical Exam  Blood pressure 162/63, pulse 73, temperature 98.8 F (37.1 C), temperature source Oral, resp. rate 15, height 5\' 7"  (1.702 m), weight 132 lb 9.6 oz (60.147 kg), SpO2 100 %.  General: Pleasant, NAD, Thin Psych: Normal affect. Anxious Neuro: Alert and oriented X 3. Moves all extremities spontaneously. HEENT: Normal  Neck: Supple without bruits or JVD. Lungs:  Resp regular and unlabored, bilateral diffuse wheezing. Heart: Irregularly irregular rhythm, regular rate 2/6 murmur. Abdomen: Soft, non-tender, non-distended, BS + x 4.  Extremities: No clubbing, cyanosis or  edema. DP/PT/Radials 2+ and equal bilaterally.  Labs  Troponin [facility] Care Test)  Recent Labs  01/07/15 1648  TROPIPOC 0.86*    Recent Labs  01/08/15 0228 01/08/15 0735  TROPONINI 3.73* 4.43*   Lab Results  Component Value Date   WBC 6.1 01/08/2015   HGB 12.5 01/08/2015   HCT 37.3 01/08/2015   MCV 96.6 01/08/2015   PLT 187 01/08/2015     Recent Labs Lab 01/08/15 0228  NA 134*  K 4.0  CL 103  CO2 25  BUN 6  CREATININE 0.76  CALCIUM 8.6*  PROT 5.6*  BILITOT 1.1  ALKPHOS 63  ALT 19  AST 41  GLUCOSE 100*   No results found for: CHOL, HDL, LDLCALC, TRIG No results found for: DDIMER   Radiology/Studies  Ct Head Wo Contrast  01/07/2015  CLINICAL DATA:  Headache with left leg weakness today. On anticoagulation. History of brain aneurysm. Code stroke. Initial encounter. EXAM: CT HEAD WITHOUT CONTRAST TECHNIQUE: Contiguous axial images were obtained from the base of the skull through the vertex without intravenous contrast. COMPARISON:  Head CT 11/11/2013. FINDINGS: There is no evidence of acute intracranial hemorrhage, mass  lesion, brain edema or extra-axial fluid collection. The ventricles and subarachnoid spaces are appropriately sized for age. There is no CT evidence of acute cortical infarction. There is chronic subcortical encephalomalacia in the posterior right frontal lobe. 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 There is also an old lacunar infarct anteriorly in the left caudate body. Intracranial vascular calcifications are noted. The visualized paranasal sinuses, mastoid air cells and middle ears are clear. The calvarium is intact. TMJ degenerative changes are present bilaterally. IMPRESSION: Stable head CT demonstrating chronic small vessel ischemic changes, but no CT evidence of acute stroke or acute hemorrhage. These results were called by telephone at the time of interpretation on 01/07/2015 at 5:02 pm to Dr. Samuel Jester , who verbally acknowledged these results. Electronically Signed   By: Carey Bullocks M.D.   On: 01/07/2015 17:04   Ct Angio Low Extrem Left W/cm &/or Wo/cm  01/07/2015  CLINICAL DATA:  Possible ischemic left foot. EXAM: CT ANGIOGRAPHY LOWER LEFT EXTREMITY TECHNIQUE: Arterially timed CT angiography of the left lower extremity was performed from the level of the upper popliteal artery through the toes. This protocol was specifically requested. CONTRAST:  OMNIPAQUE IOHEXOL 350 MG/ML SOLN COMPARISON:  None. FINDINGS: Diffuse atheromatous wall thickening and calcification of the popliteal artery without flow limiting stenosis. Heavily diseased anterior tibial artery with underfilling and occlusion above the ankle. Chronic appearing occlusion or near occlusion of the tibioperoneal trunk with reconstituted flow to the under-filled and diffusely irregular posterior tibial artery from muscular branch, with continuation to the ankle. There is thready reconstitution of the peroneal artery, which likely accounts for the faintly opacified dorsalis pedis, reportedly detected by Doppler. No  acute appearing arterial occlusion identified. There is secondary findings of inflow disease on the left: the study timing was triggered based on the left popliteal artery and there is asymmetric venous opacification of the right lower extremity (partially seen below the knee on reformats). No soft tissue gas or evidence of osteomyelitis. These results were called by telephone at the time of interpretation on 01/07/2015 at 9:20 pm to Dr. Gerhard Munch , who verbally acknowledged these results. Review of the MIP images confirms the above findings. IMPRESSION: 1. Extensive popliteal and infrapopliteal atherosclerosis with 2 vessel runoff at the ankle. 2. Secondary findings of left lower extremity  arterial inflow disease, as above. Electronically Signed   By: Marnee SpringJonathon  Watts M.D.   On: 01/07/2015 21:34   Mr Maxine GlennMra Head Wo Contrast  01/08/2015  CLINICAL DATA:  History of stroke. EXAM: MRA HEAD WITHOUT CONTRAST TECHNIQUE: Angiographic images of the Circle of Willis were obtained using MRA technique without intravenous contrast. COMPARISON:  CT head 01/07/2015 FINDINGS: Internal carotid artery is widely patent through the skullbase. There is diffuse atherosclerotic narrowing of the cavernous carotid bilaterally causing moderate to severe diffuse stenosis in both cavernous carotid, right greater than left. Supraclinoid internal carotid artery returns to normal caliber. Anterior and middle cerebral arteries normal in caliber without significant stenosis. Both vertebral arteries patent to the basilar. Left PICA widely patent. Right PICA not visualized. Basilar widely patent. Right AICA patent. Superior cerebellar and posterior cerebral arteries patent without stenosis. 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 Otherwise no significant intracranial stenosis. Electronically Signed   By: Marlan Palauharles  Clark M.D.    On: 01/08/2015 09:52    ECG  Old anterior infarct. No ST elevations but ST depress/TWI in V6 new. Atrial fibrillation. Rate 72 bpm    ASSESSMENT AND PLAN  Principal Problem:   Elevated troponin Active Problems:   Hypothyroidism   COPD (chronic obstructive pulmonary disease) (HCC)   Tobacco use   Hypertension   Atrial fibrillation (HCC)   NSTEMI:  - Troponin level continues to rise, initially at 3.73 now at 4.43. Renal function is normal.  Her EKG shows new ST depression in V6 and old anterior infarct. Vital signs are stable. She denies any current or recent history of chest pain, however her husband says that she has had some dyspnea at times. No dyspnea currently, no nausea or vomiting. Her only complaint has been left leg pain (CT angio of lower extremity negative for acute arterial occlusion) and left jaw pain radiating to neck (resolved).  - She is currently on IV heparin.  - Will hold coumadin. INR 1.8 (likely could proceed with right radial approach, 2+ pulse) - Recommend cardiac cath - risks and benefits explained including bleeding, death, renal, stroke. Plan for tomorrow -  We will need to obtain records from Dr. Oneita JollyZakhary's office to obtain complete cardiovascular records. I was able to review care everywhere as well. Called Dr. Oneita JollyZakhary's office personally but unable to speak with him.  - obtain 2D echo to assess LVF and wall motion  PVD  - Dr. Edilia Boickson reviewed/ consulted by ER  - diffuse tibial dz. ABI ordered  - carotid duplex ordered  TOB use  - 1ppd  - Cessation  Prior history of brain aneurysm (history of 4 brain stents placement (last 10/2013)  - stable  - MRI of brain done  Aortic valve replacement and mitral valve replacement 1995 [facility] - both mechanical  - no CABG  Prior right femoral artery exploration and repair   - [facility] 06/2013   - retroperitoneal hematoma on CT A/P. OR with vascular surgery femoral artery repair   Signed, Robbie Lis(Brittainy Simmons,  [place]) Donato SchultzSKAINS, MARK, MD  01/08/2015, 11:14 AM

## 2015-01-08 NOTE — ED Notes (Signed)
Patient transported to MRI 

## 2015-01-08 NOTE — ED Notes (Signed)
Per MRI pt is not a candidate d/t brain stents and mechanical heart valve.  Will notify MD.

## 2015-01-08 NOTE — Progress Notes (Signed)
PROGRESS NOTE  Jasmine West ZOX:[id number] DOB: [birth year] DOA: 01/07/2015 PCP: Elise Benne, MD  HPI/Recap of past 24 hours: Patient is a 69 year old female past oral history of atrial fibrillation, brain aneurysm status post brain stent 4, COPD with ongoing tobacco abuse and status post mechanical aortic and mitral valve replacement initially presented to [facility] on 12/18 with complaints of left-sided neck, upper extremity and lower extremity on sensation plus left foot pain who was concerned she may have had an embolic stroke even that her foot felt cool and so she came for further evaluation. Patient transferred down to [facility] for vascular surgery evaluation who after checking CT angiogram noted infra inguinal arterial occlusive disease but no evidence of acute arterial occlusion. Patient was evaluated by neurology who recommended MRI. Her troponin was also noted to be mildly elevated at 0.86 hospitalist will call for further evaluation and admission. Patient's chronic Coumadin was subtherapeutic on admission  Following admission, MRI of the brain unremarkable. Given presenting symptoms of left facial and neck pain, asked her surgery followed up ordering a carotid duplex scan. However, her repeat troponin jumped to 3.7 and another after that was 4.2. Cardiology consulted who evaluated patient and recommended cardiac catheterization. EKG noted some ST depressions. Patient herself no chest pain or shortness of breath. Her left sided symptoms have since resolved following admission. She herself feels very anxious, otherwise no complaints. Echocardiogram ordered by cardiology notes decreased ejection fraction of 35-40 percent and moderate to severe tricuspid regurg  Assessment/Plan: Principal Problem:   Elevated troponin: certainly concerning for non-STEMI. Seen by cardiology with plans for cardiac catheterization tomorrow Active Problems:   Hypothyroidism: Continue  Synthroid   COPD (chronic obstructive pulmonary disease) (HCC): Stable. When necessary nebulizers. Breathing comfortably on room air   Tobacco use: Nicotine patch.   Hypertension   Atrial fibrillation (HCC): Persistent. INR subtherapeutic. Chads 2 vessel score of 6. Restart anticoagulation Chronic systolic heart failure: Noted by  Code Status: Full code   Family Communication: Patient declined for me to call her family   Disposition Plan: Likely here for several days, pending results of cardiac catheterization    Consultants:  Cardiology  Vascular surgery   Procedures:  2-D echo done 12/19: Decreased ejection fraction of 40-45 percent, mechanical mitral and aortic valves with moderate to severe tricuspid regurg  Antibiotics:  None   Objective: BP 162/63 mmHg  Pulse 73  Temp(Src) 98.8 F (37.1 C) (Oral)  Resp 15  Ht  (1.702 m)  Wt 60.147 kg (132 lb 9.6 oz)  BMI 20.76 kg/m2  SpO2 100%  Intake/Output Summary (Last 24 hours) at 01/08/15 1644 Last data filed at 01/08/15 1100  Gross per 24 hour  Intake      0 ml  Output    400 ml  Net   -400 ml   Filed Weights   01/07/15 1639 01/08/15 1040  Weight: 58.968 kg (130 lb) 60.147 kg (132 lb 9.6 oz)    Exam:   General:  Alert and oriented 3, anxious   Cardiovascular: Irregular rhythm, 2/6 systolic ejection murmur   Respiratory: Decreased breath sounds throughout   Abdomen: Soft, nontender, nondistended, positive bowel sounds   Musculoskeletal: No clubbing or cyanosis or edema    Data Reviewed: Basic Metabolic Panel:  Recent Labs Lab 01/07/15 1638 01/07/15 1646 01/08/15 0228  NA 136 137 134*  K 3.8 3.8 4.0  CL 99* 96* 103  CO2 28  --  25  GLUCOSE  119* 114* 100*  BUN 11 10 6   CREATININE 0.83 0.90 0.76  CALCIUM 9.3  --  8.6*   Liver Function Tests:  Recent Labs Lab 01/07/15 1638 01/08/15 0228  AST 36 41  ALT 22 19  ALKPHOS 76 63  BILITOT 1.1 1.1  PROT 7.0 5.6*  ALBUMIN 4.3 3.5   No  results for input(s): LIPASE, AMYLASE in the last 168 hours. No results for input(s): AMMONIA in the last 168 hours. CBC:  Recent Labs Lab 01/07/15 1638 01/07/15 1646 01/08/15 0228  WBC 6.7  --  6.1  NEUTROABS 4.9  --  4.2  HGB 13.4 14.6 12.5  HCT 39.5 43.0 37.3  MCV 96.1  --  96.6  PLT 210  --  187   Cardiac Enzymes:    Recent Labs Lab 01/08/15 0228 01/08/15 0735 01/08/15 1332  TROPONINI 3.73* 4.43* 4.35*   BNP (last 3 results)  Recent Labs  06/07/14 1149  BNP 168.0*    ProBNP (last 3 results) No results for input(s): PROBNP in the last 8760 hours.  CBG:  Recent Labs Lab 01/07/15 1641  GLUCAP 123*    Recent Results (from the past 240 hour(s))  MRSA PCR Screening     Status: None   Collection Time: 01/08/15 11:52 AM  Result Value Ref Range Status   MRSA by PCR NEGATIVE NEGATIVE Final    Comment:        The GeneXpert MRSA Assay (FDA approved for NASAL specimens only), is one component of a comprehensive MRSA colonization surveillance program. It is not intended to diagnose MRSA infection nor to guide or monitor treatment for MRSA infections.      Studies: Ct Head Wo Contrast  01/07/2015  CLINICAL DATA:  Headache with left leg weakness today. On anticoagulation. History of brain aneurysm. Code stroke. Initial encounter. EXAM: CT HEAD WITHOUT CONTRAST TECHNIQUE: Contiguous axial images were obtained from the base of the skull through the vertex without intravenous contrast. COMPARISON:  Head CT 11/11/2013. FINDINGS: There is no evidence of acute intracranial hemorrhage, mass lesion, brain edema or extra-axial fluid collection. The ventricles and subarachnoid spaces are appropriately sized for age. There is no CT evidence of acute cortical infarction. There is chronic subcortical encephalomalacia in the posterior right frontal lobe. 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 There is also an old lacunar infarct anteriorly in the left caudate body.  Intracranial vascular calcifications are noted. The visualized paranasal sinuses, mastoid air cells and middle ears are clear. The calvarium is intact. TMJ degenerative changes are present bilaterally. IMPRESSION: Stable head CT demonstrating chronic small vessel ischemic changes, but no CT evidence of acute stroke or acute hemorrhage. These results were called by telephone at the time of interpretation on 01/07/2015 at 5:02 pm to Dr. Samuel JesterKATHLEEN MCMANUS , who verbally acknowledged these results. Electronically Signed   By: Carey BullocksWilliam  Veazey M.D.   On: 01/07/2015 17:04   Ct Angio Low Extrem Left W/cm &/or Wo/cm  01/07/2015  CLINICAL DATA:  Possible ischemic left foot. EXAM: CT ANGIOGRAPHY LOWER LEFT EXTREMITY TECHNIQUE: Arterially timed CT angiography of the left lower extremity was performed from the level of the upper popliteal artery through the toes. This protocol was specifically requested. CONTRAST:  100mL OMNIPAQUE IOHEXOL 350 MG/ML SOLN COMPARISON:  None. FINDINGS: Diffuse atheromatous wall thickening and calcification of the popliteal artery without flow limiting stenosis. Heavily diseased anterior tibial artery with underfilling and occlusion above the ankle. Chronic appearing occlusion or near occlusion of the tibioperoneal  trunk with reconstituted flow to the under-filled and diffusely irregular posterior tibial artery from muscular branch, with continuation to the ankle. There is thready reconstitution of the peroneal artery, which likely accounts for the faintly opacified dorsalis pedis, reportedly detected by Doppler. No acute appearing arterial occlusion identified. There is secondary findings of inflow disease on the left: the study timing was triggered based on the left popliteal artery and there is asymmetric venous opacification of the right lower extremity (partially seen below the knee on reformats). No soft tissue gas or evidence of osteomyelitis. These results were called by telephone at the  time of interpretation on 01/07/2015 at 9:20 pm to Dr. Gerhard Munch , who verbally acknowledged these results. Review of the MIP images confirms the above findings. IMPRESSION: 1. Extensive popliteal and infrapopliteal atherosclerosis with 2 vessel runoff at the ankle. 2. Secondary findings of left lower extremity arterial inflow disease, as above. Electronically Signed   By: Marnee Spring M.D.   On: 01/07/2015 21:34   Mr Maxine Glenn Head Wo Contrast  01/08/2015  CLINICAL DATA:  History of stroke. EXAM: MRA HEAD WITHOUT CONTRAST TECHNIQUE: Angiographic images of the Circle of Willis were obtained using MRA technique without intravenous contrast. COMPARISON:  CT head 01/07/2015 FINDINGS: Internal carotid artery is widely patent through the skullbase. There is diffuse atherosclerotic narrowing of the cavernous carotid bilaterally causing moderate to severe diffuse stenosis in both cavernous carotid, right greater than left. Supraclinoid internal carotid artery returns to normal caliber. Anterior and middle cerebral arteries normal in caliber without significant stenosis. Both vertebral arteries patent to the basilar. Left PICA widely patent. Right PICA not visualized. Basilar widely patent. Right AICA patent. Superior cerebellar and posterior cerebral arteries patent without stenosis. 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 Otherwise no significant intracranial stenosis. Electronically Signed   By: Marlan Palau M.D.   On: 01/08/2015 09:52    Scheduled Meds: . [START ON 01/09/2015] aspirin  81 mg Oral Pre-Cath  . digoxin  0.25 mg Oral q morning - 10a  . fluticasone  2 spray Each Nare QPM  . guaiFENesin  600 mg Oral BID  . [START ON 01/09/2015] Influenza vac split quadrivalent PF  0.5 mL Intramuscular Tomorrow-1000  . levothyroxine  50 mcg Oral QHS  . losartan  50 mg Oral QPM  . metoprolol  tartrate  12.5 mg Oral QPM  . montelukast  10 mg Oral q morning - 10a  . nicotine  7 mg Transdermal Once  . [START ON 01/09/2015] pneumococcal 23 valent vaccine  0.5 mL Intramuscular Tomorrow-1000  . sodium chloride  3 mL Intravenous Q12H  . sucralfate  1 g Oral QID  . Umeclidinium Bromide  1 puff Inhalation QHS  . Warfarin - Pharmacist Dosing Inpatient   Does not apply q1800    Continuous Infusions: . sodium chloride 1 mL/kg/hr (01/08/15 1231)  . heparin 1,050 Units/hr (01/08/15 1130)     Time spent: 25 minutes   Hollice Espy  [facility] Pager [phone] . If 7PM-7AM, please contact night-coverage at [email or web address], password [facility] 01/08/2015, 4:44 PM  LOS: 1 day

## 2015-01-08 NOTE — Progress Notes (Signed)
ANTICOAGULATION CONSULT NOTE - Follow-up Consult  Pharmacy Consult for Heparin and warfarin Indication: r/o Arterial Occlusion of LLE, afib  Allergies  Allergen Reactions  . Sulfa Antibiotics Photosensitivity    Patient Measurements: Height: 5\' 7"  (170.2 cm) Weight: 132 lb 9.6 oz (60.147 kg) IBW/kg (Calculated) : 61.6 Heparin Dosing Weight: 59kg  Vital Signs: Temp: 98.8 F (37.1 C) (12/19 1040) Temp Source: Oral (12/19 1040) BP: 162/63 mmHg (12/19 1040) Pulse Rate: 73 (12/19 1040)  Labs:  Recent Labs  01/07/15 1638 01/07/15 1646 01/08/15 0228 01/08/15 0631 01/08/15 0735 01/08/15 1000  HGB 13.4 14.6 12.5  --   --   --   HCT 39.5 43.0 37.3  --   --   --   PLT 210  --  187  --   --   --   APTT 35  --   --   --   --   --   LABPROT 21.0*  --   --  21.1*  --   --   INR 1.82*  --   --  1.83*  --   --   HEPARINUNFRC  --   --  <0.10*  --   --  0.20*  CREATININE 0.83 0.90 0.76  --   --   --   TROPONINI  --   --  3.73*  --  4.43*  --     Estimated Creatinine Clearance: 63.9 mL/min (by C-G formula based on Cr of 0.76).  Assessment: 69 yo presented to [facility] with possible code stroke initially - CT negative for acute event. Pt on coumadin PTA for afib (home dose: 5mg  daily except 7.5mg  on Mondays), but INR subtherapeutic on admit. Started on heparin for bridge therapy.  [facility] by [facility] surgery.  CT scan shows no evidence of acute arterial occlusion. To restart warfarin tonight, continue heparin until INR in range.  Pt also with elevated troponin, no chest pain- being evaluated by cardiology.  HL low this morning at 0.2 units/mL. INR 1.83. Hgb and plts wnl, no bleeding noted.  Goal of Therapy:  INR 2-3 Heparin level 0.3-0.7 units/ml Monitor platelets by anticoagulation protocol: Yes   Plan:  -Increase heparin infusion to 1050 units/hr -6 hr heparin level -warfarin 10mg  po x1 tonight  -Daily heparin level, INR, and CBC  Shundra Wirsing D. Jaceon Heiberger,  PharmD, BCPS Clinical Pharmacist Pager: [phone] 01/08/2015 10:52 AM

## 2015-01-08 NOTE — Progress Notes (Signed)
Pt experiencing frequent episodes of bradycardia at beginning of shift with rate as low as 37, then returning to mid 50's.  Rhythm continues to be a-fib. Pt asymptomatic.  MD notified.  Per MD, continue to monitor and if episodes happen again to complete ECG.  Bradycardic episodes have become less frequent at this time.  Will continue to monitor.    Bosie HelperHandy, Jonmarc Bodkin Noel 01/08/2015 11:16 PM

## 2015-01-08 NOTE — Progress Notes (Signed)
ANTICOAGULATION CONSULT NOTE - Follow-up Consult  Pharmacy Consult for Heparin Indication: r/o Arterial Occlusion of LLE, afib  Allergies  Allergen Reactions  . Sulfa Antibiotics Photosensitivity    Patient Measurements: Height: 5\' 7"  (170.2 cm) Weight: 130 lb (58.968 kg) IBW/kg (Calculated) : 61.6 Heparin Dosing Weight: 59kg  Vital Signs: Temp: 98 F (36.7 C) (12/18 1805) Temp Source: Oral (12/18 1639) BP: 152/70 mmHg (12/19 0145) Pulse Rate: 58 (12/19 0230)  Labs:  Recent Labs  01/07/15 1638 01/07/15 1646 01/08/15 0228  HGB 13.4 14.6 12.5  HCT 39.5 43.0 37.3  PLT 210  --  187  APTT 35  --   --   LABPROT 21.0*  --   --   INR 1.82*  --   --   HEPARINUNFRC  --   --  <0.10*  CREATININE 0.83 0.90 0.76  TROPONINI  --   --  3.73*    Estimated Creatinine Clearance: 62.7 mL/min (by C-G formula based on Cr of 0.76).  Assessment: 69 yo presented to [facility] with possible code stroke initially - CT negative for acute event. Pt on coumadin PTA for afib. INR subtherapeutic at 1.82. To start heparin for bridge therapy. [facility] by [facility]. Per vascular surgery, plan to continue heparin for now but CT scan shows no evidence of acute arterial occlusion. Pt also with elevated troponin.  Goal of Therapy:  Heparin level 0.3-0.7 units/ml Monitor platelets by anticoagulation protocol: Yes   Plan:  Increase heparin infusion to 950 units/hr F/u 6 hr heparin level Daily heparin level, INR, and CBC  Christoper Fabianaron Pete Merten, PharmD, BCPS Clinical pharmacist, pager [phone] 01/08/2015,3:30 AM

## 2015-01-08 NOTE — Progress Notes (Signed)
  Echocardiogram 2D Echocardiogram has been performed.  Jasmine West, Jasmine West 01/08/2015, 1:36 PM

## 2015-01-08 NOTE — ED Notes (Signed)
Cards Pa requesting EKG, EKG complete.  Pt a x 4, NAD, VSS, denies CP/SOB.  Transported to MRI off tele per admitting MD.

## 2015-01-08 NOTE — ED Notes (Signed)
Per MD Rito EhrlichKrishnan, pt can got to tests off of telemetry without RN.  MRI aware pt is ready.

## 2015-01-08 NOTE — Progress Notes (Signed)
   VASCULAR SURGERY ASSESSMENT & PLAN:  * No evidence of embolic disease to left lower extremity.  * Given her presenting symptoms with left facial pain and left neck pain I have ordered a carotid duplex scan which is pending.  * Her CT scan showed some diffuse tibial artery occlusive disease and I have ordered baseline ABIs.   * Given her history of atrial fibrillation, I would continue her heparin until her Coumadin is therapeutic.  SUBJECTIVE: no complaint of left foot pain this morning.  PHYSICAL EXAM: Filed Vitals:   01/08/15 0600 01/08/15 0700 01/08/15 0800 01/08/15 0815  BP: 154/66 146/70 139/71   Pulse: 60 79 62 76  Temp:      TempSrc:      Resp: 17 22 20 20   Height:      Weight:      SpO2: 99% 95% 96% 96%   Left foot is warm and well-perfused with normal venous filling.  LABS: Lab Results  Component Value Date   WBC 6.1 01/08/2015   HGB 12.5 01/08/2015   HCT 37.3 01/08/2015   MCV 96.6 01/08/2015   PLT 187 01/08/2015   Lab Results  Component Value Date   CREATININE 0.76 01/08/2015   Lab Results  Component Value Date   INR 1.83* 01/08/2015   CBG (last 3)   Recent Labs  01/07/15 1641  GLUCAP 123*    Principal Problem:   Elevated troponin Active Problems:   Hypothyroidism   COPD (chronic obstructive pulmonary disease) (HCC)   Tobacco use   Hypertension   Atrial fibrillation (HCC)    Cari CarawayChris Dickson Beeper: [phone]: [phone] 01/08/2015

## 2015-01-08 NOTE — ED Notes (Signed)
Pt transported to [facility] with RN and telemetry, RN at bedside.  Pt NAD, a x 4, all belongings in room with pt.

## 2015-01-08 NOTE — ED Notes (Signed)
MD Dickson at bedside.

## 2015-01-08 NOTE — ED Notes (Signed)
Cardiology at bedside.

## 2015-01-08 NOTE — ED Notes (Signed)
Admitting paged 

## 2015-01-08 NOTE — H&P (Addendum)
[facility] History and Physical  Jasmine West Bihl JXB:[id number] DOB: 1945-09-26 DOA: 01/07/2015  Referring physician: Gerhard Munchobert Lockwood, M.D. PCP: Elise BenneHenderson, William W, MD   Chief Complaint:   HPI: Jasmine West Noda is a 69 y.o. female with a past medical history of brain aneurysm (history of 4 brain stents placement), thyroid disease, hypertension, hyperlipidemia, active 1 PPD smoker, COPD, chronic back pain, status post tricuspid valve/aortic valve replacement, atrial fibrillation, CVA who initially presented to the [facility] emergency department earlier in the afternoon complaining of left-sided head/face/neck pain and pain on her left-sided body. She had mild dizziness and nausea, but denies vision changes, photophobia or phonophobia, slurred speech or language difficulty. Per patient, she was at church with her husband, when she started first developing symptoms. She states that she had a sudden sharp pain on her left temporal area, radiated to her left `sided cervical, facial areas that lasted for a short period and then subsided. She subsequently went home.   Later, once they arrived to their house, her husband noticed that her foot was cold and decided to go to the emergency department. When asked, she states that her legs get tired and heavy easily when she is walking. Due to the earlier symptoms, a CT of the head was performed which was negative for acute infarct or hemorrhage. SOC Neuro evaluated the patient at  [facility] and she had a NIH score 0 to them. They recommended that the pt can be admitted for non-emergent MRI brain.  The patient's troponin was also elevated at 0.86. She denies chest pain, dizziness, diaphoresis, but has frequent pitting edema of the lower extremities, frequent palpitations due to atrial fibrillation and dyspnea. She was started on heparin and [facility]  for vascular surgery evaluation.  When seen, in the emergency department,  the patient was in no acute distress. She denied further facial or lower extremity symptoms.   Review of Systems:  Constitutional:  Positive fatigue and recent weight loss of 5 pounds.  Denies night sweats, Fevers, chills  HEENT:  No headaches, Difficulty swallowing,Tooth/dental problems,Sore throat,  No sneezing, itching, ear ache, nasal congestion, post nasal drip,  Cardio-vascular:  As above mentioned GI:  Positive for dysphagia, lately the patient has been chewing her food more, drinking more liquids with meals and eating slower. Positive abdominal distention, nausea,  change in bowel habits, loss of appetite, frequent constipation. No heartburn, indigestion, abdominal pain,vomiting, diarrhea, Resp:  Positive dyspnea baseline due to COPD, frequent productive cough, frequent wheezing. Denies hemoptysis. Skin:  no rash or lesions.  GU:  no dysuria, change in color of urine, no urgency or frequency. No flank pain.  Musculoskeletal:  Chronic back pain. Psych:  Frequent anxiety. Neuro: As above mentioned.  Past Medical History  Diagnosis Date  . Brain aneurysm 10/20/13    3 on lt side and 1 on the right, most recent Oct 1  . Thyroid disease   . Hypertension   . COPD (chronic obstructive pulmonary disease) (HCC)   . Asthma   . Chronic back pain   . Sciatica   . S/P tricuspid valve replacement   . Aortic valve replaced   . Hyponatremia   . Stroke [facility](HCC)     seen on CT scan in 2015  . Episodic lightheadedness     since cerebral aneurysm repair 10/2013   Past Surgical History  Procedure Laterality Date  . Brain surgery      aneurysm stent placed 2015  . Cardiac  surgery    . Cholecystectomy    . Appendectomy     Social History:  reports that she has been smoking Cigarettes.  She has a 40 pack-year smoking history. She has never used smokeless tobacco. She reports that she does not drink alcohol or use illicit drugs.  Allergies  Allergen Reactions  . Sulfa Antibiotics  Photosensitivity    Family History  Problem Relation Age of Onset  . Cancer Sister   . Cancer Brother   . Kidney failure Father     Prior to Admission medications   Medication Sig Start Date End Date Taking? Authorizing Provider  albuterol (PROVENTIL) (2.5 MG/3ML) 0.083% nebulizer solution Take 2.5 mg by nebulization at bedtime.    Yes Historical Provider, MD  albuterol (VENTOLIN HFA) 108 (90 BASE) MCG/ACT inhaler Inhale 2 puffs into the lungs 4 (four) times daily.   Yes Historical Provider, MD  ALPRAZolam Prudy Feeler) 1 MG tablet Take 1 mg by mouth 2 (two) times daily as needed for anxiety.   Yes Historical Provider, MD  COUMADIN 5 MG tablet Take 5-7.5 mg by mouth at bedtime. Patient takes 5 mg everyday besides on Monday when patient takes 7.5 mg 05/12/14  Yes Historical Provider, MD  digoxin (LANOXIN) 0.25 MG tablet Take 0.25 mg by mouth every morning.   Yes Historical Provider, MD  fluticasone (FLONASE) 50 MCG/ACT nasal spray Place 2 sprays into both nostrils every evening.    Yes Historical Provider, MD  furosemide (LASIX) 20 MG tablet Take 20 mg by mouth as needed for fluid.   Yes Historical Provider, MD  guaiFENesin (MUCINEX) 600 MG 12 hr tablet Take 600 mg by mouth 2 (two) times daily.   Yes Historical Provider, MD  guaiFENesin (MUCINEX) 600 MG 12 hr tablet Take 600 mg by mouth 2 (two) times daily.   Yes Historical Provider, MD  levothyroxine (SYNTHROID, LEVOTHROID) 50 MCG tablet Take 50 mcg by mouth at bedtime.    Yes Historical Provider, MD  losartan (COZAAR) 50 MG tablet Take 50 mg by mouth every evening.   Yes Historical Provider, MD  metoprolol tartrate (LOPRESSOR) 25 MG tablet Take 12.5 mg by mouth every evening.   Yes Historical Provider, MD  montelukast (SINGULAIR) 10 MG tablet Take 10 mg by mouth every morning.   Yes Historical Provider, MD  oxymetazoline (AFRIN) 0.05 % nasal spray Place 1 spray into both nostrils as needed for congestion.    Yes Historical Provider, MD    pantoprazole (PROTONIX) 40 MG tablet Take 2 tablets (80 mg total) by mouth daily. 05/13/14  Yes Rolland Porter, MD  promethazine (PHENERGAN) 12.5 MG tablet Take 12.5 mg by mouth every 6 (six) hours as needed for nausea or vomiting.   Yes Historical Provider, MD  Umeclidinium Bromide (INCRUSE ELLIPTA) 62.5 MCG/INH AEPB Inhale 1 puff into the lungs at bedtime.   Yes Historical Provider, MD  furosemide (LASIX) 40 MG tablet Take 1 tablet (40 mg total) by mouth daily. 06/07/14   Benjiman Core, MD  HYDROcodone-acetaminophen (NORCO/VICODIN) 5-325 MG per tablet Take 1-2 tablets by mouth every 4 (four) hours as needed. 06/07/14   Benjiman Core, MD  sucralfate (CARAFATE) 1 G tablet Take 1 tablet (1 g total) by mouth 4 (four) times daily. 05/13/14   Rolland Porter, MD   Physical Exam: Filed Vitals:   01/08/15 0100 01/08/15 0115 01/08/15 0130 01/08/15 0145  BP: 112/67 125/40 157/62 152/70  Pulse: 67 65 77 88  Temp:      TempSrc:  Resp: Height:      Weight:      SpO2: 97% 99% 98% 99%    Wt Readings from Last 3 Encounters:  01/07/15 58.968 kg (130 lb)  06/07/14 63.504 kg (140 lb)  05/13/14 63.504 kg (140 lb)    General:  Appears calm and comfortable Eyes: PERRL, normal lids, irises & conjunctiva ENT: Mild tenderness on left temporal area, grossly normal hearing, lips & tongue  Neck: no LAD, masses or thyromegaly Cardiovascular: Irregularly irregular, positive systolic murmur, 1+ LE edema. Telemetry: Atrial fibrillation Respiratory: Bilateral wheezing and mild scattered rhonchi. Abdomen: Distended, midline surgical scar, bowel sounds positive, soft, nontender. Skin: Ecchymoses on upper extremities Musculoskeletal: Left lower extremity weakness. Psychiatric: grossly normal mood and affect, speech fluent and appropriate Neurologic: Awake alert oriented 3, grossly non-focal, except for left lower extremity motor weakness without decreased sensory.          Labs on Admission:   Basic Metabolic Panel:  Recent Labs Lab 01/07/15 1638 01/07/15 1646  NA 136 137  K 3.8 3.8  CL 99* 96*  CO2 28  --   GLUCOSE 119* 114*  BUN 11 10  CREATININE 0.83 0.90  CALCIUM 9.3  --    Liver Function Tests:  Recent Labs Lab 01/07/15 1638  AST 36  ALT 22  ALKPHOS 76  BILITOT 1.1  PROT 7.0  ALBUMIN 4.3   CBC:  Recent Labs Lab 01/07/15 1638 01/07/15 1646  WBC 6.7  --   NEUTROABS 4.9  --   HGB 13.4 14.6  HCT 39.5 43.0  MCV 96.1  --   PLT 210  --    Troponin   Recent Labs  01/07/15 1648  TROPIPOC 0.86*     CBG:  Recent Labs Lab 01/07/15 1641  GLUCAP 123*    Radiological Exams on Admission: Ct Head Wo Contrast  01/07/2015  CLINICAL DATA:  Headache with left leg weakness today. On anticoagulation. History of brain aneurysm. Code stroke. Initial encounter. EXAM: CT HEAD WITHOUT CONTRAST TECHNIQUE: Contiguous axial images were obtained from the base of the skull through the vertex without intravenous contrast. COMPARISON:  Head CT 11/11/2013. FINDINGS: There is no evidence of acute intracranial hemorrhage, mass lesion, brain edema or extra-axial fluid collection. The ventricles and subarachnoid spaces are appropriately sized for age. There is no CT evidence of acute cortical infarction. There is chronic subcortical encephalomalacia in the posterior right frontal lobe. 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 There is also an old lacunar infarct anteriorly in the left caudate body. Intracranial vascular calcifications are noted. The visualized paranasal sinuses, mastoid air cells and middle ears are clear. The calvarium is intact. TMJ degenerative changes are present bilaterally. IMPRESSION: Stable head CT demonstrating chronic small vessel ischemic changes, but no CT evidence of acute stroke or acute hemorrhage. These results were called by telephone at the time of interpretation on 01/07/2015 at 5:02 pm to Dr. Samuel Jester , who verbally  acknowledged these results. Electronically Signed   By: Carey Bullocks M.D.   On: 01/07/2015 17:04   Ct Angio Low Extrem Left W/cm &/or Wo/cm  01/07/2015  CLINICAL DATA:  Possible ischemic left foot. EXAM: CT ANGIOGRAPHY LOWER LEFT EXTREMITY TECHNIQUE: Arterially timed CT angiography of the left lower extremity was performed from the level of the upper popliteal artery through the toes. This protocol was specifically requested. CONTRAST:  OMNIPAQUE IOHEXOL 350 MG/ML SOLN COMPARISON:  None. FINDINGS: Diffuse atheromatous wall thickening and  calcification of the popliteal artery without flow limiting stenosis. Heavily diseased anterior tibial artery with underfilling and occlusion above the ankle. Chronic appearing occlusion or near occlusion of the tibioperoneal trunk with reconstituted flow to the under-filled and diffusely irregular posterior tibial artery from muscular branch, with continuation to the ankle. There is thready reconstitution of the peroneal artery, which likely accounts for the faintly opacified dorsalis pedis, reportedly detected by Doppler. No acute appearing arterial occlusion identified. There is secondary findings of inflow disease on the left: the study timing was triggered based on the left popliteal artery and there is asymmetric venous opacification of the right lower extremity (partially seen below the knee on reformats). No soft tissue gas or evidence of osteomyelitis. These results were called by telephone at the time of interpretation on 01/07/2015 at 9:20 pm to Dr. Gerhard Munch , who verbally acknowledged these results. Review of the MIP images confirms the above findings. IMPRESSION: 1. Extensive popliteal and infrapopliteal atherosclerosis with 2 vessel runoff at the ankle. 2. Secondary findings of left lower extremity arterial inflow disease, as above. Electronically Signed   By: Marnee Spring M.D.   On: 01/07/2015 21:34    EKG: Independently reviewed. Vent. rate  93 BPM PR interval * ms QRS duration 113 ms QT/QTc 385/479 ms P-R-T axes -1 80 77 Atrial fibrillation Ventricular premature complex Borderline low voltage, extremity leads Probable anteroseptal infarct, old Abnormal T, consider ischemia, lateral leads  Assessment/Plan Principal Problem:   Elevated troponin Admit to a stepdown. Continue heparin infusion. Trend troponin levels. Check echocardiogram. Cardiology evaluation in the morning.  Active Problems:   Hypothyroidism Continue levothyroxine.    COPD (chronic obstructive pulmonary disease) (HCC) Continue bronchodilators as needed. Continue supplemental oxygen.    Tobacco use Nicotine replacement therapy ordered.    Hypertension Continue furosemide, losartan and metoprolol. Monitor blood pressure.    Atrial fibrillation (HCC) Continue metoprolol 12.5 mg by mouth daily and digoxin for rate control. Continue anticoagulation.     History of CVA. Repeat CT scan is negative.  Neuro symptoms are very atypical, more accentuated by pain. Atypical migraine?  I will check MRI brain, echocardiogram and carotid Doppler. Consider re-consulting neurology evaluation if symptoms reappear.    Dysphagia, abdominal distention and weight loss. Check CT scan of chest, abdomen and pelvis.   Vascular surgery is on the case. [facility] neurology saw the patient at [facility].   Code Status: Full code. DVT Prophylaxis: On heparin infusion. Family Communication: Her husband, Jeny Nield was by the bedside. Disposition Plan: Admit to the stepdown, continue heparin infusion, follow workup by vascular surgery and CT scan chest/abdomen/pelvis.  Time spent: Over 70 minutes were spent in the process of this admission.  Bobette Mo [facility] Pager [phone].

## 2015-01-08 NOTE — Care Management Note (Signed)
Case Management Note  Patient Details  Name: Jasmine PattersonMary West MRN: [id number] Date of Birth: 08-07-1945  Subjective/Objective:        Adm w ischemia foot            Action/Plan:lives w husband, pcp dr Orson Aloehenderson   Expected Discharge Date:                  Expected Discharge Plan:     In-House Referral:     Discharge planning Services     Post Acute Care Choice:    Choice offered to:     DME Arranged:    DME Agency:     HH Arranged:    HH Agency:     Status of Service:     Medicare Important Message Given:    Date Medicare IM Given:    Medicare IM give by:    Date Additional Medicare IM Given:    Additional Medicare Important Message give by:     If discussed at Long Length of Stay Meetings, dates discussed:    Additional Comments: ur review done.  Hanley Haysowell, Jasmine Bucher T, RN 01/08/2015, 10:40 AM

## 2015-01-09 ENCOUNTER — Inpatient Hospital Stay (HOSPITAL_COMMUNITY): Payer: Medicare Other

## 2015-01-09 ENCOUNTER — Encounter (HOSPITAL_COMMUNITY)
Admission: EM | Disposition: A | Discharge status: Home / Self Care | Payer: Self-pay | Source: Home / Self Care | Attending: Internal Medicine

## 2015-01-09 ENCOUNTER — Encounter (HOSPITAL_COMMUNITY): Payer: Self-pay | Admitting: Cardiology

## 2015-01-09 DIAGNOSIS — I214 Non-ST elevation (NSTEMI) myocardial infarction: Secondary | ICD-10-CM | POA: Diagnosis present

## 2015-01-09 DIAGNOSIS — I10 Essential (primary) hypertension: Secondary | ICD-10-CM

## 2015-01-09 DIAGNOSIS — M542 Cervicalgia: Secondary | ICD-10-CM

## 2015-01-09 DIAGNOSIS — Z954 Presence of other heart-valve replacement: Secondary | ICD-10-CM

## 2015-01-09 DIAGNOSIS — R791 Abnormal coagulation profile: Secondary | ICD-10-CM

## 2015-01-09 HISTORY — DX: Presence of other heart-valve replacement: Z95.4

## 2015-01-09 HISTORY — PX: CARDIAC CATHETERIZATION: SHX172

## 2015-01-09 LAB — COMPREHENSIVE METABOLIC PANEL
ALK PHOS: 63 U/L (ref 38–126)
ALT: 21 U/L (ref 14–54)
ANION GAP: 6 (ref 5–15)
AST: 40 U/L (ref 15–41)
Albumin: 3.6 g/dL (ref 3.5–5.0)
BILIRUBIN TOTAL: 1.4 mg/dL — AB (ref 0.3–1.2)
BUN: 6 mg/dL (ref 6–20)
CO2: 27 mmol/L (ref 22–32)
CREATININE: 0.83 mg/dL (ref 0.44–1.00)
Calcium: 8.3 mg/dL — ABNORMAL LOW (ref 8.9–10.3)
Chloride: 103 mmol/L (ref 101–111)
Glucose, Bld: 97 mg/dL (ref 65–99)
Potassium: 3.8 mmol/L (ref 3.5–5.1)
SODIUM: 136 mmol/L (ref 135–145)
Total Protein: 6.1 g/dL — ABNORMAL LOW (ref 6.5–8.1)

## 2015-01-09 LAB — CBC WITH DIFFERENTIAL/PLATELET
Basophils Absolute: 0 10*3/uL (ref 0.0–0.1)
Basophils Relative: 0 %
EOS ABS: 0.1 10*3/uL (ref 0.0–0.7)
EOS PCT: 1 %
HCT: 39 % (ref 36.0–46.0)
HEMOGLOBIN: 12.9 g/dL (ref 12.0–15.0)
LYMPHS ABS: 1.1 10*3/uL (ref 0.7–4.0)
LYMPHS PCT: 19 %
MCH: 32.3 pg (ref 26.0–34.0)
MCHC: 33.1 g/dL (ref 30.0–36.0)
MCV: 97.5 fL (ref 78.0–100.0)
MONOS PCT: 6 %
Monocytes Absolute: 0.4 10*3/uL (ref 0.1–1.0)
NEUTROS PCT: 74 %
Neutro Abs: 4.3 10*3/uL (ref 1.7–7.7)
Platelets: 205 10*3/uL (ref 150–400)
RBC: 4 MIL/uL (ref 3.87–5.11)
RDW: 16.8 % — ABNORMAL HIGH (ref 11.5–15.5)
WBC: 5.9 10*3/uL (ref 4.0–10.5)

## 2015-01-09 LAB — HEPARIN LEVEL (UNFRACTIONATED)
HEPARIN UNFRACTIONATED: 0.73 [IU]/mL — AB (ref 0.30–0.70)
Heparin Unfractionated: 0.23 IU/mL — ABNORMAL LOW (ref 0.30–0.70)

## 2015-01-09 LAB — PROTIME-INR
INR: 1.7 — ABNORMAL HIGH (ref 0.00–1.49)
Prothrombin Time: 19.9 seconds — ABNORMAL HIGH (ref 11.6–15.2)

## 2015-01-09 LAB — TSH: TSH: 1.081 u[IU]/mL (ref 0.350–4.500)

## 2015-01-09 SURGERY — LEFT HEART CATH AND CORONARY ANGIOGRAPHY

## 2015-01-09 MED ORDER — MORPHINE SULFATE (PF) 2 MG/ML IV SOLN
2.0000 mg | INTRAVENOUS | Status: DC | PRN
  Administered 2015-01-09 – 2015-01-11 (×5): 2 mg via INTRAVENOUS
  Filled 2015-01-09 (×5): qty 1

## 2015-01-09 MED ORDER — HEPARIN (PORCINE) IN NACL 100-0.45 UNIT/ML-% IJ SOLN
1300.0000 [IU]/h | INTRAMUSCULAR | Status: DC
  Administered 2015-01-10: 1150 [IU]/h via INTRAVENOUS
  Administered 2015-01-10 – 2015-01-12 (×3): 1300 [IU]/h via INTRAVENOUS
  Filled 2015-01-09 (×5): qty 250

## 2015-01-09 MED ORDER — FENTANYL CITRATE (PF) 100 MCG/2ML IJ SOLN
INTRAMUSCULAR | Status: AC
  Filled 2015-01-09: qty 2

## 2015-01-09 MED ORDER — VERAPAMIL HCL 2.5 MG/ML IV SOLN
INTRAVENOUS | Status: AC
  Filled 2015-01-09: qty 2

## 2015-01-09 MED ORDER — NITROGLYCERIN 1 MG/10 ML FOR IR/CATH LAB
INTRA_ARTERIAL | Status: AC
  Filled 2015-01-09: qty 10

## 2015-01-09 MED ORDER — MORPHINE SULFATE (PF) 2 MG/ML IV SOLN
0.5000 mg | Freq: Once | INTRAVENOUS | Status: AC
  Administered 2015-01-09: 0.5 mg via INTRAVENOUS
  Filled 2015-01-09: qty 1

## 2015-01-09 MED ORDER — SODIUM CHLORIDE 0.9 % IV SOLN: 250.0000 mL | INTRAVENOUS | Status: DC | PRN

## 2015-01-09 MED ORDER — HEPARIN (PORCINE) IN NACL 2-0.9 UNIT/ML-% IJ SOLN
INTRAMUSCULAR | Status: AC
  Filled 2015-01-09: qty 1000

## 2015-01-09 MED ORDER — MIDAZOLAM HCL 2 MG/2ML IJ SOLN
INTRAMUSCULAR | Status: AC
  Filled 2015-01-09: qty 2

## 2015-01-09 MED ORDER — FENTANYL CITRATE (PF) 100 MCG/2ML IJ SOLN
INTRAMUSCULAR | Status: DC | PRN
  Administered 2015-01-09 (×3): 25 ug via INTRAVENOUS

## 2015-01-09 MED ORDER — SODIUM CHLORIDE 0.9 % IV SOLN: INTRAVENOUS | Status: AC

## 2015-01-09 MED ORDER — ONDANSETRON HCL 4 MG/2ML IJ SOLN: 4.0000 mg | Freq: Four times a day (QID) | INTRAMUSCULAR | Status: DC | PRN

## 2015-01-09 MED ORDER — HEPARIN SODIUM (PORCINE) 1000 UNIT/ML IJ SOLN
INTRAMUSCULAR | Status: AC
  Filled 2015-01-09: qty 1

## 2015-01-09 MED ORDER — HEPARIN SODIUM (PORCINE) 1000 UNIT/ML IJ SOLN
INTRAMUSCULAR | Status: DC | PRN
  Administered 2015-01-09: 3000 [IU] via INTRAVENOUS

## 2015-01-09 MED ORDER — VERAPAMIL HCL 2.5 MG/ML IV SOLN
INTRA_ARTERIAL | Status: DC | PRN
  Administered 2015-01-09: 15 mL via INTRA_ARTERIAL

## 2015-01-09 MED ORDER — NITROGLYCERIN 1 MG/10 ML FOR IR/CATH LAB
INTRA_ARTERIAL | Status: DC | PRN
  Administered 2015-01-09: 16:00:00

## 2015-01-09 MED ORDER — LIDOCAINE HCL (PF) 1 % IJ SOLN
INTRAMUSCULAR | Status: AC
  Filled 2015-01-09: qty 30

## 2015-01-09 MED ORDER — IOHEXOL 350 MG/ML SOLN
INTRAVENOUS | Status: DC | PRN
  Administered 2015-01-09: 70 mL via INTRAVENOUS

## 2015-01-09 MED ORDER — LORAZEPAM 2 MG/ML IJ SOLN
0.5000 mg | Freq: Once | INTRAMUSCULAR | Status: AC
  Administered 2015-01-09: 0.5 mg via INTRAVENOUS
  Filled 2015-01-09: qty 1

## 2015-01-09 MED ORDER — SODIUM CHLORIDE 0.9 % IJ SOLN: 3.0000 mL | INTRAMUSCULAR | Status: DC | PRN

## 2015-01-09 MED ORDER — IOHEXOL 300 MG/ML  SOLN
100.0000 mL | Freq: Once | INTRAMUSCULAR | Status: AC | PRN
  Administered 2015-01-09: 80 mL via INTRAVENOUS

## 2015-01-09 MED ORDER — ACETAMINOPHEN 325 MG PO TABS
650.0000 mg | ORAL_TABLET | ORAL | Status: DC | PRN
  Administered 2015-01-09 – 2015-01-12 (×5): 650 mg via ORAL
  Filled 2015-01-09 (×6): qty 2

## 2015-01-09 MED ORDER — SODIUM CHLORIDE 0.9 % IJ SOLN
3.0000 mL | Freq: Two times a day (BID) | INTRAMUSCULAR | Status: DC
  Administered 2015-01-10 – 2015-01-12 (×5): 3 mL via INTRAVENOUS

## 2015-01-09 MED ORDER — MIDAZOLAM HCL 2 MG/2ML IJ SOLN
INTRAMUSCULAR | Status: DC | PRN
  Administered 2015-01-09 (×2): 1 mg via INTRAVENOUS
  Administered 2015-01-09: 2 mg via INTRAVENOUS

## 2015-01-09 SURGICAL SUPPLY — 9 items
CATH OPTITORQUE TIG 4.0 5F (CATHETERS) ×3 IMPLANT
DEVICE RAD COMP TR BAND LRG (VASCULAR PRODUCTS) ×3 IMPLANT
GLIDESHEATH SLEND A-KIT 6F 22G (SHEATH) IMPLANT
GLIDESHEATH SLEND SS 6F .021 (SHEATH) ×3 IMPLANT
KIT HEART LEFT (KITS) ×3 IMPLANT
PACK CARDIAC CATHETERIZATION (CUSTOM PROCEDURE TRAY) ×3 IMPLANT
TRANSDUCER W/STOPCOCK (MISCELLANEOUS) ×3 IMPLANT
TUBING CIL FLEX 10 FLL-RA (TUBING) ×3 IMPLANT
WIRE SAFE-T 1.5MM-J .035X260CM (WIRE) ×3 IMPLANT

## 2015-01-09 NOTE — Progress Notes (Addendum)
TR band on rt wrist w/12cc air. Rt hand and fingers warm and pink, sensation present.Moniter shows atrial fib. Aldrete score 10.

## 2015-01-09 NOTE — Interval H&P Note (Signed)
Cath Lab Visit (complete for each Cath Lab visit)  Clinical Evaluation Leading to the Procedure:   ACS: Yes.    Non-ACS:    Anginal Classification: CCS IV  Anti-ischemic medical therapy: Maximal Therapy (2 or more classes of medications)  Non-Invasive Test Results: No non-invasive testing performed  Prior CABG: No previous CABG      History and Physical Interval Note:  01/09/2015 3:32 PM  Jasmine West  has presented today for surgery, with the diagnosis of cp  The various methods of treatment have been discussed with the patient and family. After consideration of risks, benefits and other options for treatment, the patient has consented to  Procedure(s): Left Heart Cath and Coronary Angiography (N/A) as a surgical intervention .  The patient's history has been reviewed, patient examined, no change in status, stable for surgery.  I have reviewed the patient's chart and labs.  Questions were answered to the patient's satisfaction.     Wyatt Thorstenson A

## 2015-01-09 NOTE — Progress Notes (Signed)
01/09/2015 pneumococcal vaccine was given in left deltoid at 1106. Lot #:[id number] and Expire Jun 11, 2016. [facility].

## 2015-01-09 NOTE — Progress Notes (Signed)
Subjective:   Did not sleep well. Transient bradycardia, AFIB (30's), CT abd done. Cardiomegally noted. ECHO done. See below. No CP. No SOB. Anxious.  Objective:  Vital Signs in the last 24 hours: Temp:  [97.6 F (36.4 C)-98.9 F (37.2 C)] 98.9 F (37.2 C) (12/20 0835) Pulse Rate:  [62-83] 82 (12/20 0835) Resp:  [14-25] 18 (12/20 0835) BP: (117-175)/(44-85) 150/72 mmHg (12/20 0835) SpO2:  [95 %-100 %] 97 % (12/20 0835) Weight:  [132 lb 9.6 oz (60.147 kg)] 132 lb 9.6 oz (60.147 kg) (12/19 1040)  Intake/Output from previous day: 12/19 0701 - 12/20 0700 In: 1883.2 [P.O.:480; I.V.:1403.2] Out: 2100 [Urine:2100]   Physical Exam: General: Well developed, well nourished, in no acute distress. Head:  Normocephalic and atraumatic. Lungs: Clear to auscultation and percussion. Heart: Normal S1 and S2. (Metallic sound not as prominent as expected). 2/6 S murmur, rubs or gallops.  Abdomen: soft, non-tender, positive bowel sounds. Extremities: No clubbing or cyanosis. No edema. Neurologic: Alert and oriented x 3. Anxious    Lab Results:  Recent Labs  01/08/15 0228 01/09/15 0345  WBC 6.1 5.9  HGB 12.5 12.9  PLT 187 205    Recent Labs  01/08/15 0228 01/09/15 0345  NA 134* 136  K 4.0 3.8  CL 103 103  CO2 25 27  GLUCOSE 100* 97  BUN 6 6  CREATININE 0.76 0.83    Recent Labs  01/08/15 0735 01/08/15 1332  TROPONINI 4.43* 4.35*   Hepatic Function Panel  Recent Labs  01/09/15 0345  PROT 6.1*  ALBUMIN 3.6  AST 40  ALT 21  ALKPHOS 63  BILITOT 1.4*   No results for input(s): CHOL in the last 72 hours. No results for input(s): PROTIME in the last 72 hours.  Imaging: Ct Head Wo Contrast  01/07/2015  CLINICAL DATA:  Headache with left leg weakness today. On anticoagulation. History of brain aneurysm. Code stroke. Initial encounter. EXAM: CT HEAD WITHOUT CONTRAST TECHNIQUE: Contiguous axial images were obtained from the base of the skull through the vertex  without intravenous contrast. COMPARISON:  Head CT 11/11/2013. FINDINGS: There is no evidence of acute intracranial hemorrhage, mass lesion, brain edema or extra-axial fluid collection. The ventricles and subarachnoid spaces are appropriately sized for age. There is no CT evidence of acute cortical infarction. There is chronic subcortical encephalomalacia in the posterior right frontal lobe. 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 There is also an old lacunar infarct anteriorly in the left caudate body. Intracranial vascular calcifications are noted. The visualized paranasal sinuses, mastoid air cells and middle ears are clear. The calvarium is intact. TMJ degenerative changes are present bilaterally. IMPRESSION: Stable head CT demonstrating chronic small vessel ischemic changes, but no CT evidence of acute stroke or acute hemorrhage. These results were called by telephone at the time of interpretation on 01/07/2015 at 5:02 pm to Dr. Samuel Jester , who verbally acknowledged these results. Electronically Signed   By: Carey Bullocks M.D.   On: 01/07/2015 17:04   Ct Chest W Contrast  01/09/2015  CLINICAL DATA:  69 year old female with dysphagia. EXAM: CT CHEST, ABDOMEN, AND PELVIS WITH CONTRAST TECHNIQUE: Multidetector CT imaging of the chest, abdomen and pelvis was performed following the standard protocol during bolus administration of intravenous contrast. CONTRAST:  80mL OMNIPAQUE IOHEXOL 300 MG/ML  SOLN COMPARISON:  None. FINDINGS: There is emphysematous changes of the lungs. No focal consolidation, pleural effusion, or pneumothorax. The central airways are patent. There is atherosclerotic calcification of the  thoracic aorta. No aneurysm or dissection. The central pulmonary arteries appear patent. There is severe cardiomegaly. Mechanical mitral and aortic valves. No pericardial effusion. A slightly enlarged left hilar lymph node measuring 1.5 cm in short axis. Mildly prominent subcarinal lymph  nodes noted. The visualized esophagus is grossly unremarkable. The thyroid gland is not well visualized. There is no axillary adenopathy. The chest wall soft tissues appear unremarkable. No intra-abdominal free air or free fluid. There is minimal irregularity of the hepatic contour. The gallbladder is not visualized, likely surgically absent. Mild periportal edema. The pancreas, spleen, and adrenal glands appear unremarkable. Bilateral renal cortical irregularity, likely related to recurrent infarct and scarring. A 1.7 cm right renal cyst. Subcentimeter left renal hypodense lesion is too small to characterize. There is no hydronephrosis on either side. The visualized ureters and urinary bladder appear unremarkable. Small uterine calcification may represent calcified fibroids. Ultrasound may provide better evaluation of the pelvic structures. There is mild apparent thickening of the proximal duodenum, likely related to underdistention. Duodenitis is less likely. Clinical correlation is recommended. There is a 1.8 cm nodular density along the wall of the proximal duodenum (coronal series 5, image 33 and axial series 2 image 76). This area of the duodenum is not evaluated due to is non opacification with oral contrast. This may represent a focal area of thickening or a duodenal diverticulum. Other etiologies are not excluded. Endoscopy may provide better evaluation if clinically indicated. There is sigmoid diverticulosis without active inflammation. Moderate stool noted throughout the colon. No evidence of bowel obstruction. Appendectomy. Advanced aortoiliac atherosclerotic disease. The origins of the celiac axis, SMA, IMA as well as the origins of the renal arteries remain patent. No portal venous gas identified. There is retrograde flow of contrast into the IVC and hepatic veins compatible with a degree of right heart dysfunction. There is no adenopathy in the abdomen pelvis. There is multilevel degenerative changes  of the spine. No acute fracture. Grade 1 L2-L3 retrolisthesis. Median sternotomy wires. IMPRESSION: Emphysema.  No focal consolidation or pneumothorax. Severe cardiomegaly with evidence of right cardiac dysfunction. No evidence of bowel obstruction. Apparent mild thickening of the duodenal wall, likely related to underdistention. Duodenitis is not excluded. Clinical correlation is recommended. Focal nodular thickening along the duodenal wall may represent a diverticulum. Other etiologies are not excluded. Endoscopy may provide better evaluation. Electronically Signed   By: Elgie Collard M.D.   On: 01/09/2015 02:04   Ct Angio Low Extrem Left W/cm &/or Wo/cm  01/07/2015  CLINICAL DATA:  Possible ischemic left foot. EXAM: CT ANGIOGRAPHY LOWER LEFT EXTREMITY TECHNIQUE: Arterially timed CT angiography of the left lower extremity was performed from the level of the upper popliteal artery through the toes. This protocol was specifically requested. CONTRAST:  OMNIPAQUE IOHEXOL 350 MG/ML SOLN COMPARISON:  None. FINDINGS: Diffuse atheromatous wall thickening and calcification of the popliteal artery without flow limiting stenosis. Heavily diseased anterior tibial artery with underfilling and occlusion above the ankle. Chronic appearing occlusion or near occlusion of the tibioperoneal trunk with reconstituted flow to the under-filled and diffusely irregular posterior tibial artery from muscular branch, with continuation to the ankle. There is thready reconstitution of the peroneal artery, which likely accounts for the faintly opacified dorsalis pedis, reportedly detected by Doppler. No acute appearing arterial occlusion identified. There is secondary findings of inflow disease on the left: the study timing was triggered based on the left popliteal artery and there is asymmetric venous opacification of the right lower extremity (partially seen  below the knee on reformats). No soft tissue gas or evidence of  osteomyelitis. These results were called by telephone at the time of interpretation on 01/07/2015 at 9:20 pm to Dr. Gerhard MunchOBERT LOCKWOOD , who verbally acknowledged these results. Review of the MIP images confirms the above findings. IMPRESSION: 1. Extensive popliteal and infrapopliteal atherosclerosis with 2 vessel runoff at the ankle. 2. Secondary findings of left lower extremity arterial inflow disease, as above. Electronically Signed   By: Marnee SpringJonathon  Watts M.D.   On: 01/07/2015 21:34   Mr Maxine GlennMra Head Wo Contrast  01/08/2015  CLINICAL DATA:  History of stroke. EXAM: MRA HEAD WITHOUT CONTRAST TECHNIQUE: Angiographic images of the Circle of Willis were obtained using MRA technique without intravenous contrast. COMPARISON:  CT head 01/07/2015 FINDINGS: Internal carotid artery is widely patent through the skullbase. There is diffuse atherosclerotic narrowing of the cavernous carotid bilaterally causing moderate to severe diffuse stenosis in both cavernous carotid, right greater than left. Supraclinoid internal carotid artery returns to normal caliber. Anterior and middle cerebral arteries normal in caliber without significant stenosis. Both vertebral arteries patent to the basilar. Left PICA widely patent. Right PICA not visualized. Basilar widely patent. Right AICA patent. Superior cerebellar and posterior cerebral arteries patent without stenosis. 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 Otherwise no significant intracranial stenosis. Electronically Signed   By: Marlan Palauharles  Clark M.D.   On: 01/08/2015 09:52   Ct Abdomen Pelvis W Contrast  01/09/2015  CLINICAL DATA:  69 year old female with dysphagia. EXAM: CT CHEST, ABDOMEN, AND PELVIS WITH CONTRAST TECHNIQUE: Multidetector CT imaging of the chest, abdomen and pelvis was performed following the standard protocol during bolus administration of intravenous  contrast. CONTRAST:  80mL OMNIPAQUE IOHEXOL 300 MG/ML  SOLN COMPARISON:  None. FINDINGS: There is emphysematous changes of the lungs. No focal consolidation, pleural effusion, or pneumothorax. The central airways are patent. There is atherosclerotic calcification of the thoracic aorta. No aneurysm or dissection. The central pulmonary arteries appear patent. There is severe cardiomegaly. Mechanical mitral and aortic valves. No pericardial effusion. A slightly enlarged left hilar lymph node measuring 1.5 cm in short axis. Mildly prominent subcarinal lymph nodes noted. The visualized esophagus is grossly unremarkable. The thyroid gland is not well visualized. There is no axillary adenopathy. The chest wall soft tissues appear unremarkable. No intra-abdominal free air or free fluid. There is minimal irregularity of the hepatic contour. The gallbladder is not visualized, likely surgically absent. Mild periportal edema. The pancreas, spleen, and adrenal glands appear unremarkable. Bilateral renal cortical irregularity, likely related to recurrent infarct and scarring. A 1.7 cm right renal cyst. Subcentimeter left renal hypodense lesion is too small to characterize. There is no hydronephrosis on either side. The visualized ureters and urinary bladder appear unremarkable. Small uterine calcification may represent calcified fibroids. Ultrasound may provide better evaluation of the pelvic structures. There is mild apparent thickening of the proximal duodenum, likely related to underdistention. Duodenitis is less likely. Clinical correlation is recommended. There is a 1.8 cm nodular density along the wall of the proximal duodenum (coronal series 5, image 33 and axial series 2 image 76). This area of the duodenum is not evaluated due to is non opacification with oral contrast. This may represent a focal area of thickening or a duodenal diverticulum. Other etiologies are not excluded. Endoscopy may provide better evaluation if  clinically indicated. There is sigmoid diverticulosis without active inflammation. Moderate stool noted throughout the colon.  No evidence of bowel obstruction. Appendectomy. Advanced aortoiliac atherosclerotic disease. The origins of the celiac axis, SMA, IMA as well as the origins of the renal arteries remain patent. No portal venous gas identified. There is retrograde flow of contrast into the IVC and hepatic veins compatible with a degree of right heart dysfunction. There is no adenopathy in the abdomen pelvis. There is multilevel degenerative changes of the spine. No acute fracture. Grade 1 L2-L3 retrolisthesis. Median sternotomy wires. IMPRESSION: Emphysema.  No focal consolidation or pneumothorax. Severe cardiomegaly with evidence of right cardiac dysfunction. No evidence of bowel obstruction. Apparent mild thickening of the duodenal wall, likely related to underdistention. Duodenitis is not excluded. Clinical correlation is recommended. Focal nodular thickening along the duodenal wall may represent a diverticulum. Other etiologies are not excluded. Endoscopy may provide better evaluation. Electronically Signed   By: Elgie Collard M.D.   On: 01/09/2015 02:04   Personally viewed.   Telemetry: Transient bradycardia 30's asymptomatic, AFIB Personally viewed.   EKG:  AFIB 70's TWI new V5-6. Personally viewed.  Cardiac Studies:  ECHO 01/08/15: - Left ventricle: Diffuse hypokinesis worse in the septum and apex. The cavity size was mildly dilated. Wall thickness was normal. Systolic function was mildly to moderately reduced. The estimated ejection fraction was in the range of 40% to 45%. - Aortic valve: Mechanical AVR with no significant perivalvular regurgitation Systolic gradients mildly elevated. Valve area (VTI): 1.16 cm^2. Valve area (Vmax): 1.14 cm^2. Valve area (Vmean): 1.32 cm^2. - Mitral valve: Mechanical MV with normal diastolic gradients and no significant peri valvular  regurgitations. Valve area by continuity equation (using LVOT flow): 2 cm^2. - Left atrium: The atrium was severely dilated. - Right atrium: The atrium was moderately dilated. - Atrial septum: No defect or patent foramen ovale was identified. - Tricuspid valve: There was moderate-severe regurgitation. - Pulmonary arteries: PA peak pressure: 58 mm Hg (S). - Impressions: Consider TEE to further evaluate mechanical AV/MV ifclinically indicated.  Meds: Scheduled Meds: . digoxin  0.25 mg Oral q morning - 10a  . docusate sodium  100 mg Oral BID  . fluticasone  2 spray Each Nare QPM  . guaiFENesin  600 mg Oral BID  . Influenza vac split quadrivalent PF  0.5 mL Intramuscular Tomorrow-1000  . levothyroxine  50 mcg Oral QHS  . lidocaine  1 patch Transdermal Q24H  . losartan  50 mg Oral QPM  . metoprolol tartrate  12.5 mg Oral QPM  . montelukast  10 mg Oral q morning - 10a  . nicotine  21 mg Transdermal Daily  . pneumococcal 23 valent vaccine  0.5 mL Intramuscular Tomorrow-1000  . sodium chloride  3 mL Intravenous Q12H  . sucralfate  1 g Oral QID  . Umeclidinium Bromide  1 puff Inhalation QHS  . Warfarin - Pharmacist Dosing Inpatient   Does not apply q1800   Continuous Infusions: . sodium chloride 1 mL/kg/hr (01/09/15 0321)  . heparin 1,250 Units/hr (01/09/15 4098)   PRN Meds:.sodium chloride, ALPRAZolam, iohexol, ipratropium-albuterol, morphine injection, ondansetron **OR** ondansetron (ZOFRAN) IV, oxymetazoline, promethazine, sodium chloride  Assessment/Plan:  Principal Problem:   Elevated troponin Active Problems:   Hypothyroidism   COPD (chronic obstructive pulmonary disease) (HCC)   Tobacco use   Hypertension   S/P mitral valve replacement with metallic valve   Atrial fibrillation (HCC)   Subtherapeutic international normalized ratio (INR)   S/P aortic valve replacement with metallic valve   AFIB   - permanent  - will stop digoxin 0.25 since  transient bradycardia  -  continue low dose metoprolol 12.5 bid  NSTEMI  - Trop 4  - No CP but had left face, neck pain earlier  - CT of neck with cavernous carotid dz  - Bb  - Heparin IV (INR 1.7)  - Radial cath (2+ pulse)  - DO NOT cross AV (mechanical)  - Try to avoid RIGHT femoral artery (prior repair at [facility] following intracranial stent placement in 06/2013).   PVD - Dr. Edilia Bo reviewed/ consulted by ER  - Had complaint of cold leg which prompted consult - diffuse tibial dz. ABI ordered - carotid duplex ordered  - No evidence of acute arterial occlusion (See Dr. Edilia Bo consult note)  TOB use - 1ppd - Cessation  Prior history of brain aneurysm (history of 4 brain stents placement (last 10/2013) - stable - MRI of brain done  Aortic valve replacement and mitral valve replacement 1995 [facility] - both mechanical - no CABG  Prior right femoral artery exploration and repair  - [facility] 06/2013  - retroperitoneal hematoma on CT A/P. OR with vascular surgery femoral artery repair  SKAINS, MARK 01/09/2015, 8:50 AM

## 2015-01-09 NOTE — Progress Notes (Signed)
PROGRESS NOTE  Jasmine West ONG:[id number] DOB: [birth year] DOA: 01/07/2015 PCP: Elise Benne, MD  HPI/Recap of past 24 hours: Patient is a 69 year old female past oral history of atrial fibrillation, brain aneurysm status post brain stent 4, COPD with ongoing tobacco abuse and status post mechanical aortic and mitral valve replacement initially presented to [facility] on 12/18 with complaints of left-sided neck, upper extremity and lower extremity on sensation plus left foot pain who was concerned she may have had an embolic stroke even that her foot felt cool and so she came for further evaluation. Patient transferred down to [facility] for vascular surgery evaluation who after checking CT angiogram noted infra inguinal arterial occlusive disease but no evidence of acute arterial occlusion. Patient was evaluated by neurology who recommended MRI. Her troponin was also noted to be mildly elevated at 0.86 hospitalist will call for further evaluation and admission. Patient's chronic Coumadin was subtherapeutic on admission  Following admission, MRI of the brain unremarkable. Given presenting symptoms of left facial and neck pain, asked her surgery followed up ordering a carotid duplex scan. However, her repeat troponin jumped to 3.7 and another after that was 4.2. Cardiology consulted who evaluated patient and recommended cardiac catheterization. EKG noted some ST depressions. Her left sided symptoms have since resolved following admission. Echocardiogram ordered by cardiology notes decreased ejection fraction of 35-40 percent and moderate to severe tricuspid regurg  Patient seen prior to cardiac catheterization. Had a rough time sleeping last night. Feels very anxious. Denies any chest pain or shortness of breath.  Assessment/Plan: Principal Problem:   Elevated troponin: certainly concerning for non-STEMI. Cardiology following. Cardiac catheterization pending for later  today Active Problems:   Hypothyroidism: Continue Synthroid   COPD (chronic obstructive pulmonary disease) (HCC): Stable. When necessary nebulizers. Breathing comfortably on room air   Tobacco use: Nicotine patch.   Hypertension   Atrial fibrillation (HCC): Persistent. INR subtherapeutic. Chads 2 vessel score of 6. Restart anticoagulation, post cardiac catheterization Chronic systolic heart failure: Noted by cardiac cath. We'll check BNP  Code Status: Full code   Family Communication: Husband at the bedside  Disposition Plan: Disposition pending from cardiac catheterization results   Consultants:  Cardiology  Vascular surgery   Procedures:  2-D echo done 12/19: Decreased ejection fraction of 40-45 percent, mechanical mitral and aortic valves with moderate to severe tricuspid regurg  Antibiotics:  None   Objective: BP 153/98 mmHg  Pulse 76  Temp(Src) 98.8 F (37.1 C) (Oral)  Resp 20  Ht 5\' 7"  (1.702 m)  Wt 60.147 kg (132 lb 9.6 oz)  BMI 20.76 kg/m2  SpO2 96%  Intake/Output Summary (Last 24 hours) at 01/09/15 1315 Last data filed at 01/09/15 0600  Gross per 24 hour  Intake   1523 ml  Output    900 ml  Net    623 ml   Filed Weights   01/07/15 1639 01/08/15 1040  Weight: 58.968 kg (130 lb) 60.147 kg (132 lb 9.6 oz)    Exam:   General:  Alert and oriented 3, anxious   Cardiovascular: Irregular rhythm, 2/6 systolic ejection murmur , rate controlled  Respiratory: Decreased breath sounds throughout   Abdomen: Soft, nontender, nondistended, positive bowel sounds   Musculoskeletal: No clubbing or cyanosis or edema    Data Reviewed: Basic Metabolic Panel:  Recent Labs Lab 01/07/15 1638 01/07/15 1646 01/08/15 0228 01/09/15 0345  NA 136 137 134* 136  K 3.8 3.8 4.0 3.8  CL 99* 96* 103  103  CO2 28  --  25 27  GLUCOSE 119* 114* 100* 97  BUN CREATININE 0.83 0.90 0.76 0.83  CALCIUM 9.3  --  8.6* 8.3*   Liver Function Tests:  Recent  Labs Lab 01/07/15 1638 01/08/15 0228 01/09/15 0345  AST 36 41 40  ALT ALKPHOS 76 63 63  BILITOT 1.1 1.1 1.4*  PROT 7.0 5.6* 6.1*  ALBUMIN 4.3 3.5 3.6   No results for input(s): LIPASE, AMYLASE in the last 168 hours. No results for input(s): AMMONIA in the last 168 hours. CBC:  Recent Labs Lab 01/07/15 1638 01/07/15 1646 01/08/15 0228 01/09/15 0345  WBC 6.7  --  6.1 5.9  NEUTROABS 4.9  --  4.2 4.3  HGB 13.4 14.6 12.5 12.9  HCT 39.5 43.0 37.3 39.0  MCV 96.1  --  96.6 97.5  PLT 210  --  187 205   Cardiac Enzymes:    Recent Labs Lab 01/08/15 0228 01/08/15 0735 01/08/15 1332  TROPONINI 3.73* 4.43* 4.35*   BNP (last 3 results)  Recent Labs  06/07/14 1149  BNP 168.0*    ProBNP (last 3 results) No results for input(s): PROBNP in the last 8760 hours.  CBG:  Recent Labs Lab 01/07/15 1641  GLUCAP 123*    Recent Results (from the past 240 hour(s))  MRSA PCR Screening     Status: None   Collection Time: 01/08/15 11:52 AM  Result Value Ref Range Status   MRSA by PCR NEGATIVE NEGATIVE Final    Comment:        The GeneXpert MRSA Assay (FDA approved for NASAL specimens only), is one component of a comprehensive MRSA colonization surveillance program. It is not intended to diagnose MRSA infection nor to guide or monitor treatment for MRSA infections.      Studies: Ct Chest W Contrast  01/09/2015  CLINICAL DATA:  69 year old female with dysphagia. EXAM: CT CHEST, ABDOMEN, AND PELVIS WITH CONTRAST TECHNIQUE: Multidetector CT imaging of the chest, abdomen and pelvis was performed following the standard protocol during bolus administration of intravenous contrast. CONTRAST:  80mL OMNIPAQUE IOHEXOL 300 MG/ML  SOLN COMPARISON:  None. FINDINGS: There is emphysematous changes of the lungs. No focal consolidation, pleural effusion, or pneumothorax. The central airways are patent. There is atherosclerotic calcification of the thoracic aorta. No aneurysm or  dissection. The central pulmonary arteries appear patent. There is severe cardiomegaly. Mechanical mitral and aortic valves. No pericardial effusion. A slightly enlarged left hilar lymph node measuring 1.5 cm in short axis. Mildly prominent subcarinal lymph nodes noted. The visualized esophagus is grossly unremarkable. The thyroid gland is not well visualized. There is no axillary adenopathy. The chest wall soft tissues appear unremarkable. No intra-abdominal free air or free fluid. There is minimal irregularity of the hepatic contour. The gallbladder is not visualized, likely surgically absent. Mild periportal edema. The pancreas, spleen, and adrenal glands appear unremarkable. Bilateral renal cortical irregularity, likely related to recurrent infarct and scarring. A 1.7 cm right renal cyst. Subcentimeter left renal hypodense lesion is too small to characterize. There is no hydronephrosis on either side. The visualized ureters and urinary bladder appear unremarkable. Small uterine calcification may represent calcified fibroids. Ultrasound may provide better evaluation of the pelvic structures. There is mild apparent thickening of the proximal duodenum, likely related to underdistention. Duodenitis is less likely. Clinical correlation is recommended. There is a 1.8 cm nodular density along the wall of the proximal duodenum (coronal series  5, image 33 and axial series 2 image 76). This area of the duodenum is not evaluated due to is non opacification with oral contrast. This may represent a focal area of thickening or a duodenal diverticulum. Other etiologies are not excluded. Endoscopy may provide better evaluation if clinically indicated. There is sigmoid diverticulosis without active inflammation. Moderate stool noted throughout the colon. No evidence of bowel obstruction. Appendectomy. Advanced aortoiliac atherosclerotic disease. The origins of the celiac axis, SMA, IMA as well as the origins of the renal  arteries remain patent. No portal venous gas identified. There is retrograde flow of contrast into the IVC and hepatic veins compatible with a degree of right heart dysfunction. There is no adenopathy in the abdomen pelvis. There is multilevel degenerative changes of the spine. No acute fracture. Grade 1 L2-L3 retrolisthesis. Median sternotomy wires. IMPRESSION: Emphysema.  No focal consolidation or pneumothorax. Severe cardiomegaly with evidence of right cardiac dysfunction. No evidence of bowel obstruction. Apparent mild thickening of the duodenal wall, likely related to underdistention. Duodenitis is not excluded. Clinical correlation is recommended. Focal nodular thickening along the duodenal wall may represent a diverticulum. Other etiologies are not excluded. Endoscopy may provide better evaluation. Electronically Signed   By: Elgie CollardArash  Radparvar M.D.   On: 01/09/2015 02:04   Ct Abdomen Pelvis W Contrast  01/09/2015  CLINICAL DATA:  69 year old female with dysphagia. EXAM: CT CHEST, ABDOMEN, AND PELVIS WITH CONTRAST TECHNIQUE: Multidetector CT imaging of the chest, abdomen and pelvis was performed following the standard protocol during bolus administration of intravenous contrast. CONTRAST:  80mL OMNIPAQUE IOHEXOL 300 MG/ML  SOLN COMPARISON:  None. FINDINGS: There is emphysematous changes of the lungs. No focal consolidation, pleural effusion, or pneumothorax. The central airways are patent. There is atherosclerotic calcification of the thoracic aorta. No aneurysm or dissection. The central pulmonary arteries appear patent. There is severe cardiomegaly. Mechanical mitral and aortic valves. No pericardial effusion. A slightly enlarged left hilar lymph node measuring 1.5 cm in short axis. Mildly prominent subcarinal lymph nodes noted. The visualized esophagus is grossly unremarkable. The thyroid gland is not well visualized. There is no axillary adenopathy. The chest wall soft tissues appear unremarkable. No  intra-abdominal free air or free fluid. There is minimal irregularity of the hepatic contour. The gallbladder is not visualized, likely surgically absent. Mild periportal edema. The pancreas, spleen, and adrenal glands appear unremarkable. Bilateral renal cortical irregularity, likely related to recurrent infarct and scarring. A 1.7 cm right renal cyst. Subcentimeter left renal hypodense lesion is too small to characterize. There is no hydronephrosis on either side. The visualized ureters and urinary bladder appear unremarkable. Small uterine calcification may represent calcified fibroids. Ultrasound may provide better evaluation of the pelvic structures. There is mild apparent thickening of the proximal duodenum, likely related to underdistention. Duodenitis is less likely. Clinical correlation is recommended. There is a 1.8 cm nodular density along the wall of the proximal duodenum (coronal series 5, image 33 and axial series 2 image 76). This area of the duodenum is not evaluated due to is non opacification with oral contrast. This may represent a focal area of thickening or a duodenal diverticulum. Other etiologies are not excluded. Endoscopy may provide better evaluation if clinically indicated. There is sigmoid diverticulosis without active inflammation. Moderate stool noted throughout the colon. No evidence of bowel obstruction. Appendectomy. Advanced aortoiliac atherosclerotic disease. The origins of the celiac axis, SMA, IMA as well as the origins of the renal arteries remain patent. No portal venous gas identified. There  is retrograde flow of contrast into the IVC and hepatic veins compatible with a degree of right heart dysfunction. There is no adenopathy in the abdomen pelvis. There is multilevel degenerative changes of the spine. No acute fracture. Grade 1 L2-L3 retrolisthesis. Median sternotomy wires. IMPRESSION: Emphysema.  No focal consolidation or pneumothorax. Severe cardiomegaly with evidence of  right cardiac dysfunction. No evidence of bowel obstruction. Apparent mild thickening of the duodenal wall, likely related to underdistention. Duodenitis is not excluded. Clinical correlation is recommended. Focal nodular thickening along the duodenal wall may represent a diverticulum. Other etiologies are not excluded. Endoscopy may provide better evaluation. Electronically Signed   By: Elgie Collard M.D.   On: 01/09/2015 02:04    Scheduled Meds: . docusate sodium  100 mg Oral BID  . fluticasone  2 spray Each Nare QPM  . guaiFENesin  600 mg Oral BID  . levothyroxine  50 mcg Oral QHS  . lidocaine  1 patch Transdermal Q24H  . losartan  50 mg Oral QPM  . metoprolol tartrate  12.5 mg Oral QPM  . montelukast  10 mg Oral q morning - 10a  . nicotine  21 mg Transdermal Daily  . sodium chloride  3 mL Intravenous Q12H  . sucralfate  1 g Oral QID  . Umeclidinium Bromide  1 puff Inhalation QHS  . Warfarin - Pharmacist Dosing Inpatient   Does not apply q1800    Continuous Infusions: . sodium chloride 1 mL/kg/hr (01/09/15 0321)  . heparin 1,250 Units/hr (01/09/15 1610)     Time spent: 15 minutes   Hollice Espy  [facility] Pager [phone] . If 7PM-7AM, please contact night-coverage at [email or web address], password [facility] 01/09/2015, 1:15 PM  LOS: 2 days

## 2015-01-09 NOTE — Progress Notes (Signed)
Attempted to schedule Cardiac CTA but no readers until next week.  Plan as outpt.  Will discuss with Dr. Bishop Limbo. Kelly.

## 2015-01-09 NOTE — H&P (View-Only) (Signed)
Subjective:   Did not sleep well. Transient bradycardia, AFIB (30's), CT abd done. Cardiomegally noted. ECHO done. See below. No CP. No SOB. Anxious.  Objective:  Vital Signs in the last 24 hours: Temp:  [97.6 F (36.4 C)-98.9 F (37.2 C)] 98.9 F (37.2 C) (12/20 0835) Pulse Rate:  [62-83] 82 (12/20 0835) Resp:  [14-25] 18 (12/20 0835) BP: (117-175)/(44-85) 150/72 mmHg (12/20 0835) SpO2:  [95 %-100 %] 97 % (12/20 0835) Weight:  [132 lb 9.6 oz (60.147 kg)] 132 lb 9.6 oz (60.147 kg) (12/19 1040)  Intake/Output from previous day: 12/19 0701 - 12/20 0700 In: 1883.2 [P.O.:480; I.V.:1403.2] Out: 2100 [Urine:2100]   Physical Exam: General: Well developed, well nourished, in no acute distress. Head:  Normocephalic and atraumatic. Lungs: Clear to auscultation and percussion. Heart: Normal S1 and S2. (Metallic sound not as prominent as expected). 2/6 S murmur, rubs or gallops.  Abdomen: soft, non-tender, positive bowel sounds. Extremities: No clubbing or cyanosis. No edema. Neurologic: Alert and oriented x 3. Anxious    Lab Results:  Recent Labs  01/08/15 0228 01/09/15 0345  WBC 6.1 5.9  HGB 12.5 12.9  PLT 187 205    Recent Labs  01/08/15 0228 01/09/15 0345  NA 134* 136  K 4.0 3.8  CL 103 103  CO2 25 27  GLUCOSE 100* 97  BUN 6 6  CREATININE 0.76 0.83    Recent Labs  01/08/15 0735 01/08/15 1332  TROPONINI 4.43* 4.35*   Hepatic Function Panel  Recent Labs  01/09/15 0345  PROT 6.1*  ALBUMIN 3.6  AST 40  ALT 21  ALKPHOS 63  BILITOT 1.4*   No results for input(s): CHOL in the last 72 hours. No results for input(s): PROTIME in the last 72 hours.  Imaging: Ct Head Wo Contrast  01/07/2015  CLINICAL DATA:  Headache with left leg weakness today. On anticoagulation. History of brain aneurysm. Code stroke. Initial encounter. EXAM: CT HEAD WITHOUT CONTRAST TECHNIQUE: Contiguous axial images were obtained from the base of the skull through the vertex  without intravenous contrast. COMPARISON:  Head CT 11/11/2013. FINDINGS: There is no evidence of acute intracranial hemorrhage, mass lesion, brain edema or extra-axial fluid collection. The ventricles and subarachnoid spaces are appropriately sized for age. There is no CT evidence of acute cortical infarction. There is chronic subcortical encephalomalacia in the posterior right frontal lobe. 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 There is also an old lacunar infarct anteriorly in the left caudate body. Intracranial vascular calcifications are noted. The visualized paranasal sinuses, mastoid air cells and middle ears are clear. The calvarium is intact. TMJ degenerative changes are present bilaterally. IMPRESSION: Stable head CT demonstrating chronic small vessel ischemic changes, but no CT evidence of acute stroke or acute hemorrhage. These results were called by telephone at the time of interpretation on 01/07/2015 at 5:02 pm to Dr. Samuel Jester , who verbally acknowledged these results. Electronically Signed   By: Carey Bullocks M.D.   On: 01/07/2015 17:04   Ct Chest W Contrast  01/09/2015  CLINICAL DATA:  69 year old female with dysphagia. EXAM: CT CHEST, ABDOMEN, AND PELVIS WITH CONTRAST TECHNIQUE: Multidetector CT imaging of the chest, abdomen and pelvis was performed following the standard protocol during bolus administration of intravenous contrast. CONTRAST:  80mL OMNIPAQUE IOHEXOL 300 MG/ML  SOLN COMPARISON:  None. FINDINGS: There is emphysematous changes of the lungs. No focal consolidation, pleural effusion, or pneumothorax. The central airways are patent. There is atherosclerotic calcification of the  thoracic aorta. No aneurysm or dissection. The central pulmonary arteries appear patent. There is severe cardiomegaly. Mechanical mitral and aortic valves. No pericardial effusion. A slightly enlarged left hilar lymph node measuring 1.5 cm in short axis. Mildly prominent subcarinal lymph  nodes noted. The visualized esophagus is grossly unremarkable. The thyroid gland is not well visualized. There is no axillary adenopathy. The chest wall soft tissues appear unremarkable. No intra-abdominal free air or free fluid. There is minimal irregularity of the hepatic contour. The gallbladder is not visualized, likely surgically absent. Mild periportal edema. The pancreas, spleen, and adrenal glands appear unremarkable. Bilateral renal cortical irregularity, likely related to recurrent infarct and scarring. A 1.7 cm right renal cyst. Subcentimeter left renal hypodense lesion is too small to characterize. There is no hydronephrosis on either side. The visualized ureters and urinary bladder appear unremarkable. Small uterine calcification may represent calcified fibroids. Ultrasound may provide better evaluation of the pelvic structures. There is mild apparent thickening of the proximal duodenum, likely related to underdistention. Duodenitis is less likely. Clinical correlation is recommended. There is a 1.8 cm nodular density along the wall of the proximal duodenum (coronal series 5, image 33 and axial series 2 image 76). This area of the duodenum is not evaluated due to is non opacification with oral contrast. This may represent a focal area of thickening or a duodenal diverticulum. Other etiologies are not excluded. Endoscopy may provide better evaluation if clinically indicated. There is sigmoid diverticulosis without active inflammation. Moderate stool noted throughout the colon. No evidence of bowel obstruction. Appendectomy. Advanced aortoiliac atherosclerotic disease. The origins of the celiac axis, SMA, IMA as well as the origins of the renal arteries remain patent. No portal venous gas identified. There is retrograde flow of contrast into the IVC and hepatic veins compatible with a degree of right heart dysfunction. There is no adenopathy in the abdomen pelvis. There is multilevel degenerative changes  of the spine. No acute fracture. Grade 1 L2-L3 retrolisthesis. Median sternotomy wires. IMPRESSION: Emphysema.  No focal consolidation or pneumothorax. Severe cardiomegaly with evidence of right cardiac dysfunction. No evidence of bowel obstruction. Apparent mild thickening of the duodenal wall, likely related to underdistention. Duodenitis is not excluded. Clinical correlation is recommended. Focal nodular thickening along the duodenal wall may represent a diverticulum. Other etiologies are not excluded. Endoscopy may provide better evaluation. Electronically Signed   By: Elgie Collard M.D.   On: 01/09/2015 02:04   Ct Angio Low Extrem Left W/cm &/or Wo/cm  01/07/2015  CLINICAL DATA:  Possible ischemic left foot. EXAM: CT ANGIOGRAPHY LOWER LEFT EXTREMITY TECHNIQUE: Arterially timed CT angiography of the left lower extremity was performed from the level of the upper popliteal artery through the toes. This protocol was specifically requested. CONTRAST:  OMNIPAQUE IOHEXOL 350 MG/ML SOLN COMPARISON:  None. FINDINGS: Diffuse atheromatous wall thickening and calcification of the popliteal artery without flow limiting stenosis. Heavily diseased anterior tibial artery with underfilling and occlusion above the ankle. Chronic appearing occlusion or near occlusion of the tibioperoneal trunk with reconstituted flow to the under-filled and diffusely irregular posterior tibial artery from muscular branch, with continuation to the ankle. There is thready reconstitution of the peroneal artery, which likely accounts for the faintly opacified dorsalis pedis, reportedly detected by Doppler. No acute appearing arterial occlusion identified. There is secondary findings of inflow disease on the left: the study timing was triggered based on the left popliteal artery and there is asymmetric venous opacification of the right lower extremity (partially seen  below the knee on reformats). No soft tissue gas or evidence of  osteomyelitis. These results were called by telephone at the time of interpretation on 01/07/2015 at 9:20 pm to Dr. Gerhard MunchOBERT LOCKWOOD , who verbally acknowledged these results. Review of the MIP images confirms the above findings. IMPRESSION: 1. Extensive popliteal and infrapopliteal atherosclerosis with 2 vessel runoff at the ankle. 2. Secondary findings of left lower extremity arterial inflow disease, as above. Electronically Signed   By: Marnee SpringJonathon  Watts M.D.   On: 01/07/2015 21:34   Mr Maxine GlennMra Head Wo Contrast  01/08/2015  CLINICAL DATA:  History of stroke. EXAM: MRA HEAD WITHOUT CONTRAST TECHNIQUE: Angiographic images of the Circle of Willis were obtained using MRA technique without intravenous contrast. COMPARISON:  CT head 01/07/2015 FINDINGS: Internal carotid artery is widely patent through the skullbase. There is diffuse atherosclerotic narrowing of the cavernous carotid bilaterally causing moderate to severe diffuse stenosis in both cavernous carotid, right greater than left. Supraclinoid internal carotid artery returns to normal caliber. Anterior and middle cerebral arteries normal in caliber without significant stenosis. Both vertebral arteries patent to the basilar. Left PICA widely patent. Right PICA not visualized. Basilar widely patent. Right AICA patent. Superior cerebellar and posterior cerebral arteries patent without stenosis. 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 Otherwise no significant intracranial stenosis. Electronically Signed   By: Marlan Palauharles  Clark M.D.   On: 01/08/2015 09:52   Ct Abdomen Pelvis W Contrast  01/09/2015  CLINICAL DATA:  69 year old female with dysphagia. EXAM: CT CHEST, ABDOMEN, AND PELVIS WITH CONTRAST TECHNIQUE: Multidetector CT imaging of the chest, abdomen and pelvis was performed following the standard protocol during bolus administration of intravenous  contrast. CONTRAST:  80mL OMNIPAQUE IOHEXOL 300 MG/ML  SOLN COMPARISON:  None. FINDINGS: There is emphysematous changes of the lungs. No focal consolidation, pleural effusion, or pneumothorax. The central airways are patent. There is atherosclerotic calcification of the thoracic aorta. No aneurysm or dissection. The central pulmonary arteries appear patent. There is severe cardiomegaly. Mechanical mitral and aortic valves. No pericardial effusion. A slightly enlarged left hilar lymph node measuring 1.5 cm in short axis. Mildly prominent subcarinal lymph nodes noted. The visualized esophagus is grossly unremarkable. The thyroid gland is not well visualized. There is no axillary adenopathy. The chest wall soft tissues appear unremarkable. No intra-abdominal free air or free fluid. There is minimal irregularity of the hepatic contour. The gallbladder is not visualized, likely surgically absent. Mild periportal edema. The pancreas, spleen, and adrenal glands appear unremarkable. Bilateral renal cortical irregularity, likely related to recurrent infarct and scarring. A 1.7 cm right renal cyst. Subcentimeter left renal hypodense lesion is too small to characterize. There is no hydronephrosis on either side. The visualized ureters and urinary bladder appear unremarkable. Small uterine calcification may represent calcified fibroids. Ultrasound may provide better evaluation of the pelvic structures. There is mild apparent thickening of the proximal duodenum, likely related to underdistention. Duodenitis is less likely. Clinical correlation is recommended. There is a 1.8 cm nodular density along the wall of the proximal duodenum (coronal series 5, image 33 and axial series 2 image 76). This area of the duodenum is not evaluated due to is non opacification with oral contrast. This may represent a focal area of thickening or a duodenal diverticulum. Other etiologies are not excluded. Endoscopy may provide better evaluation if  clinically indicated. There is sigmoid diverticulosis without active inflammation. Moderate stool noted throughout the colon.  No evidence of bowel obstruction. Appendectomy. Advanced aortoiliac atherosclerotic disease. The origins of the celiac axis, SMA, IMA as well as the origins of the renal arteries remain patent. No portal venous gas identified. There is retrograde flow of contrast into the IVC and hepatic veins compatible with a degree of right heart dysfunction. There is no adenopathy in the abdomen pelvis. There is multilevel degenerative changes of the spine. No acute fracture. Grade 1 L2-L3 retrolisthesis. Median sternotomy wires. IMPRESSION: Emphysema.  No focal consolidation or pneumothorax. Severe cardiomegaly with evidence of right cardiac dysfunction. No evidence of bowel obstruction. Apparent mild thickening of the duodenal wall, likely related to underdistention. Duodenitis is not excluded. Clinical correlation is recommended. Focal nodular thickening along the duodenal wall may represent a diverticulum. Other etiologies are not excluded. Endoscopy may provide better evaluation. Electronically Signed   By: Elgie Collard M.D.   On: 01/09/2015 02:04   Personally viewed.   Telemetry: Transient bradycardia 30's asymptomatic, AFIB Personally viewed.   EKG:  AFIB 70's TWI new V5-6. Personally viewed.  Cardiac Studies:  ECHO 01/08/15: - Left ventricle: Diffuse hypokinesis worse in the septum and apex. The cavity size was mildly dilated. Wall thickness was normal. Systolic function was mildly to moderately reduced. The estimated ejection fraction was in the range of 40% to 45%. - Aortic valve: Mechanical AVR with no significant perivalvular regurgitation Systolic gradients mildly elevated. Valve area (VTI): 1.16 cm^2. Valve area (Vmax): 1.14 cm^2. Valve area (Vmean): 1.32 cm^2. - Mitral valve: Mechanical MV with normal diastolic gradients and no significant peri valvular  regurgitations. Valve area by continuity equation (using LVOT flow): 2 cm^2. - Left atrium: The atrium was severely dilated. - Right atrium: The atrium was moderately dilated. - Atrial septum: No defect or patent foramen ovale was identified. - Tricuspid valve: There was moderate-severe regurgitation. - Pulmonary arteries: PA peak pressure: 58 mm Hg (S). - Impressions: Consider TEE to further evaluate mechanical AV/MV ifclinically indicated.  Meds: Scheduled Meds: . digoxin  0.25 mg Oral q morning - 10a  . docusate sodium  100 mg Oral BID  . fluticasone  2 spray Each Nare QPM  . guaiFENesin  600 mg Oral BID  . Influenza vac split quadrivalent PF  0.5 mL Intramuscular Tomorrow-1000  . levothyroxine  50 mcg Oral QHS  . lidocaine  1 patch Transdermal Q24H  . losartan  50 mg Oral QPM  . metoprolol tartrate  12.5 mg Oral QPM  . montelukast  10 mg Oral q morning - 10a  . nicotine  21 mg Transdermal Daily  . pneumococcal 23 valent vaccine  0.5 mL Intramuscular Tomorrow-1000  . sodium chloride  3 mL Intravenous Q12H  . sucralfate  1 g Oral QID  . Umeclidinium Bromide  1 puff Inhalation QHS  . Warfarin - Pharmacist Dosing Inpatient   Does not apply q1800   Continuous Infusions: . sodium chloride 1 mL/kg/hr (01/09/15 0321)  . heparin 1,250 Units/hr (01/09/15 4098)   PRN Meds:.sodium chloride, ALPRAZolam, iohexol, ipratropium-albuterol, morphine injection, ondansetron **OR** ondansetron (ZOFRAN) IV, oxymetazoline, promethazine, sodium chloride  Assessment/Plan:  Principal Problem:   Elevated troponin Active Problems:   Hypothyroidism   COPD (chronic obstructive pulmonary disease) (HCC)   Tobacco use   Hypertension   S/P mitral valve replacement with metallic valve   Atrial fibrillation (HCC)   Subtherapeutic international normalized ratio (INR)   S/P aortic valve replacement with metallic valve   AFIB   - permanent  - will stop digoxin 0.25 since  transient bradycardia  -  continue low dose metoprolol 12.5 bid  NSTEMI  - Trop 4  - No CP but had left face, neck pain earlier  - CT of neck with cavernous carotid dz  - Bb  - Heparin IV (INR 1.7)  - Radial cath (2+ pulse)  - DO NOT cross AV (mechanical)  - Try to avoid RIGHT femoral artery (prior repair at [facility] following intracranial stent placement in 06/2013).   PVD - Dr. Edilia Bo reviewed/ consulted by ER  - Had complaint of cold leg which prompted consult - diffuse tibial dz. ABI ordered - carotid duplex ordered  - No evidence of acute arterial occlusion (See Dr. Edilia Bo consult note)  TOB use - 1ppd - Cessation  Prior history of brain aneurysm (history of 4 brain stents placement (last 10/2013) - stable - MRI of brain done  Aortic valve replacement and mitral valve replacement 1995 [facility] - both mechanical - no CABG  Prior right femoral artery exploration and repair  - [facility] 06/2013  - retroperitoneal hematoma on CT A/P. OR with vascular surgery femoral artery repair  SKAINS, MARK 01/09/2015, 8:50 AM

## 2015-01-09 NOTE — Progress Notes (Signed)
RN paged this NP earlier because pt was having episodic bradycardia as low as 37 but not sustained. Pt in Afib (chronic). NP reviewed tele and at that time, HR was 72. Had ordered EKG earlier, but will hold off for now given normal rate. RN asked to get EKG should pt have sustained bradycardia. Pt has already received her Digoxin and Metoprolol for 12/19. Pt with hx of hypothyroidism on Synthroid. Check TSH.  KJKG, NP [facility]

## 2015-01-09 NOTE — Progress Notes (Signed)
Preliminary results by tech - Carotid Duplex Completed. Mild plaque visualized in both internal carotid arteries with no evidence of a significant stenosis or dissection noted. Bilateral vertebral arteries demonstrate antegrade flow. Jasmine West, BS, RDMS, RVT

## 2015-01-09 NOTE — Progress Notes (Signed)
ANTICOAGULATION CONSULT NOTE - Follow Up Consult  Pharmacy Consult for Heparin Indication: Post-cath - hx mechanical AVR + MVR, r/o arterial occlusion of LLE  Allergies  Allergen Reactions  . Sulfa Antibiotics Photosensitivity    Patient Measurements: Height: 5\' 7"  (170.2 cm) Weight: 131 lb 9.6 oz (59.693 kg) IBW/kg (Calculated) : 61.6 Heparin Dosing Weight: 60 kg  Vital Signs: Temp: 98.3 F (36.8 C) (12/20 1735) Temp Source: Oral (12/20 1735) BP: 146/52 mmHg (12/20 1655) Pulse Rate: 41 (12/20 1655)  Labs:  Recent Labs  01/07/15 1638 01/07/15 1646 01/08/15 0228 01/08/15 0631 01/08/15 0735  01/08/15 1332 01/08/15 1657 01/09/15 0345 01/09/15 1250  HGB 13.4 14.6 12.5  --   --   --   --   --  12.9  --   HCT 39.5 43.0 37.3  --   --   --   --   --  39.0  --   PLT 210  --  187  --   --   --   --   --  205  --   APTT 35  --   --   --   --   --   --   --   --   --   LABPROT 21.0*  --   --  21.1*  --   --   --   --  19.9*  --   INR 1.82*  --   --  1.83*  --   --   --   --  1.70*  --   HEPARINUNFRC  --   --  <0.10*  --   --   < >  --  0.31 0.23* 0.73*  CREATININE 0.83 0.90 0.76  --   --   --   --   --  0.83  --   TROPONINI  --   --  3.73*  --  4.43*  --  4.35*  --   --   --   < > = values in this interval not displayed.  Estimated Creatinine Clearance: 61.1 mL/min (by C-G formula based on Cr of 0.83).   Assessment: [age] YOF with [facility] and Afib on chronic warfarin PTA. She also has severe PVD and is r/o arterial occlusion of the LLE. The patient was noted to have a bump in troponins so warfarin was held for plans for cardiac cath.   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 Cardiology is planning to follow-up with a cardiac CT angiography.   Pharmacy has been consulted to resume heparin 8 hours  after sheath removal. The sheath is noted to have been removed at 1611 in the cath lab with a TR band placed. Will plan to resume at a rate that provided levels close to the therapeutic range.    Called Dr. Tresa EndoKelly to discuss if warfarin should be resumed this evening - he stated to just start heparin tonight and warfarin will likely be resumed starting tomorrow (12/21).   Goal of Therapy:  Heparin level 0.3-0.7 units/ml Monitor platelets by anticoagulation protocol: Yes   Plan:  1. Resume heparin at a rate of 1150 units/hr starting at 0030 2. Will continue to monitor for any signs/symptoms of bleeding and will follow up with heparin level in 6 hours after heparin restarted  Georgina PillionElizabeth Dominik Lauricella, PharmD, BCPS Clinical Pharmacist  Pager: [phone] 01/09/2015 6:12 PM

## 2015-01-09 NOTE — Progress Notes (Signed)
TR band--10cc remaining. Rt radial 2+. Husband in to visit. [facility] Transferred to [id number] by stretcher.

## 2015-01-09 NOTE — Progress Notes (Signed)
ANTICOAGULATION CONSULT NOTE - Follow Up Consult  Pharmacy Consult for heparin Indication: atrial fibrillation and r/o arterial occlusion   Labs:  Recent Labs  01/07/15 1638 01/07/15 1646  01/08/15 0228 01/08/15 0631 01/08/15 0735 01/08/15 1000 01/08/15 1332 01/08/15 1657 01/09/15 0345  HGB 13.4 14.6  --  12.5  --   --   --   --   --  12.9  HCT 39.5 43.0  --  37.3  --   --   --   --   --  39.0  PLT 210  --   --  187  --   --   --   --   --  205  APTT 35  --   --   --   --   --   --   --   --   --   LABPROT 21.0*  --   --   --  21.1*  --   --   --   --  19.9*  INR 1.82*  --   --   --  1.83*  --   --   --   --  1.70*  HEPARINUNFRC  --   --   < > <0.10*  --   --  0.20*  --  0.31 0.23*  CREATININE 0.83 0.90  --  0.76  --   --   --   --   --  0.83  TROPONINI  --   --   --  3.73*  --  4.43*  --  4.35*  --   --   < > = values in this interval not displayed.    Assessment: 69yo female now subtherapeutic on heparin after one level at goal and rate increase to attempt to keep in range; scheduled for cath this afternoon.  Goal of Therapy:  Heparin level 0.3-0.7 units/ml   Plan:  Will increase heparin gtt by ~2 units/kg/hr to 1250 units/hr and try to get level prior to cath since will need to resume post-cath.  Vernard GamblesVeronda Miyako Oelke, PharmD, BCPS  01/09/2015,6:04 AM

## 2015-01-09 NOTE — Progress Notes (Signed)
01/09/2015  Influenza vaccine is given in left deltoid at 1056. Lot #: [id number] and expire 07/04/2015. [facility] Jaquanda Wickersham RN.

## 2015-01-09 NOTE — Progress Notes (Addendum)
ANTICOAGULATION CONSULT NOTE - Follow-up Consult  Pharmacy Consult for Heparin and warfarin Indication: r/o Arterial Occlusion of LLE, afib, mechanical aortic and mitral valve replacement  Allergies  Allergen Reactions  . Sulfa Antibiotics Photosensitivity    Patient Measurements: Height: 5\' 7"  (170.2 cm) Weight: 132 lb 9.6 oz (60.147 kg) IBW/kg (Calculated) : 61.6 Heparin Dosing Weight: 59kg  Vital Signs: Temp: 98.8 F (37.1 C) (12/20 1259) Temp Source: Oral (12/20 1259) BP: 153/98 mmHg (12/20 1259) Pulse Rate: 76 (12/20 1259)  Labs:  Recent Labs  01/07/15 1638 01/07/15 1646 01/08/15 0228 01/08/15 0631 01/08/15 0735  01/08/15 1332 01/08/15 1657 01/09/15 0345 01/09/15 1250  HGB 13.4 14.6 12.5  --   --   --   --   --  12.9  --   HCT 39.5 43.0 37.3  --   --   --   --   --  39.0  --   PLT 210  --  187  --   --   --   --   --  205  --   APTT 35  --   --   --   --   --   --   --   --   --   LABPROT 21.0*  --   --  21.1*  --   --   --   --  19.9*  --   INR 1.82*  --   --  1.83*  --   --   --   --  1.70*  --   HEPARINUNFRC  --   --  <0.10*  --   --   < >  --  0.31 0.23* 0.73*  CREATININE 0.83 0.90 0.76  --   --   --   --   --  0.83  --   TROPONINI  --   --  3.73*  --  4.43*  --  4.35*  --   --   --   < > = values in this interval not displayed.  Estimated Creatinine Clearance: 61.5 mL/min (by C-G formula based on Cr of 0.83).  Assessment: HL low this morning at 0.23 units/mL and rate increased to 1250 units/hr- now repeat level is elevated at 0.73 units/mL. Patient is going to cath this afternoon.  CBC WNL, no bleeding noted.  Warfarin was discontinued yesterday in preparation for cath. INR 1.7 this morning.   Goal of Therapy:  Heparin level 0.3-0.7 units/ml Monitor platelets by anticoagulation protocol: Yes   Plan:  -Decrease heparin infusion to 1200 units/hr -f/u plans after cath for resumption of anticoagulation -daily HL, INR and CBC  Lilyann Gravelle D. Traevon Meiring,  PharmD, BCPS Clinical Pharmacist Pager: [phone] 01/09/2015 1:38 PM

## 2015-01-10 ENCOUNTER — Encounter (HOSPITAL_COMMUNITY): Payer: Self-pay | Admitting: Cardiovascular Disease

## 2015-01-10 ENCOUNTER — Inpatient Hospital Stay (HOSPITAL_COMMUNITY): Payer: Medicare Other

## 2015-01-10 DIAGNOSIS — I4891 Unspecified atrial fibrillation: Secondary | ICD-10-CM

## 2015-01-10 DIAGNOSIS — I482 Chronic atrial fibrillation: Secondary | ICD-10-CM

## 2015-01-10 LAB — CBC WITH DIFFERENTIAL/PLATELET
BASOS ABS: 0 10*3/uL (ref 0.0–0.1)
Basophils Relative: 0 %
EOS ABS: 0 10*3/uL (ref 0.0–0.7)
EOS PCT: 0 %
HCT: 41.9 % (ref 36.0–46.0)
Hemoglobin: 13.6 g/dL (ref 12.0–15.0)
LYMPHS PCT: 7 %
Lymphs Abs: 0.7 10*3/uL (ref 0.7–4.0)
MCH: 31.8 pg (ref 26.0–34.0)
MCHC: 32.5 g/dL (ref 30.0–36.0)
MCV: 97.9 fL (ref 78.0–100.0)
MONO ABS: 0.7 10*3/uL (ref 0.1–1.0)
Monocytes Relative: 6 %
Neutro Abs: 9.4 10*3/uL — ABNORMAL HIGH (ref 1.7–7.7)
Neutrophils Relative %: 87 %
PLATELETS: 215 10*3/uL (ref 150–400)
RBC: 4.28 MIL/uL (ref 3.87–5.11)
RDW: 17.2 % — AB (ref 11.5–15.5)
WBC: 10.8 10*3/uL — AB (ref 4.0–10.5)

## 2015-01-10 LAB — COMPREHENSIVE METABOLIC PANEL
ALT: 19 U/L (ref 14–54)
AST: 33 U/L (ref 15–41)
Albumin: 3.9 g/dL (ref 3.5–5.0)
Alkaline Phosphatase: 68 U/L (ref 38–126)
Anion gap: 9 (ref 5–15)
BUN: 6 mg/dL (ref 6–20)
CHLORIDE: 103 mmol/L (ref 101–111)
CO2: 25 mmol/L (ref 22–32)
Calcium: 8.8 mg/dL — ABNORMAL LOW (ref 8.9–10.3)
Creatinine, Ser: 0.89 mg/dL (ref 0.44–1.00)
Glucose, Bld: 105 mg/dL — ABNORMAL HIGH (ref 65–99)
POTASSIUM: 3.7 mmol/L (ref 3.5–5.1)
SODIUM: 137 mmol/L (ref 135–145)
Total Bilirubin: 1.9 mg/dL — ABNORMAL HIGH (ref 0.3–1.2)
Total Protein: 6.7 g/dL (ref 6.5–8.1)

## 2015-01-10 LAB — PROTIME-INR
INR: 1.43 (ref 0.00–1.49)
Prothrombin Time: 17.5 seconds — ABNORMAL HIGH (ref 11.6–15.2)

## 2015-01-10 LAB — HEPARIN LEVEL (UNFRACTIONATED)
HEPARIN UNFRACTIONATED: 0.45 [IU]/mL (ref 0.30–0.70)
Heparin Unfractionated: 0.21 IU/mL — ABNORMAL LOW (ref 0.30–0.70)

## 2015-01-10 MED ORDER — FUROSEMIDE 40 MG PO TABS
40.0000 mg | ORAL_TABLET | Freq: Every day | ORAL | Status: DC
  Administered 2015-01-11 – 2015-01-13 (×3): 40 mg via ORAL
  Filled 2015-01-10 (×3): qty 1

## 2015-01-10 MED ORDER — ALPRAZOLAM 0.5 MG PO TABS
1.0000 mg | ORAL_TABLET | Freq: Once | ORAL | Status: AC
  Administered 2015-01-10: 1 mg via ORAL
  Filled 2015-01-10: qty 2

## 2015-01-10 MED ORDER — WARFARIN - PHARMACIST DOSING INPATIENT
Freq: Every day | Status: DC
  Administered 2015-01-12: 18:00:00

## 2015-01-10 MED ORDER — WARFARIN SODIUM 10 MG PO TABS
10.0000 mg | ORAL_TABLET | Freq: Once | ORAL | Status: AC
  Administered 2015-01-10: 10 mg via ORAL
  Filled 2015-01-10: qty 1

## 2015-01-10 NOTE — Progress Notes (Signed)
Patient complaining of feeling warm and became very anxious saying that her blood is to thick and she needs to talk with a Cardiologist. MD on call notified and waiting for response. Husband at bedside. Will continue to monitor.

## 2015-01-10 NOTE — Progress Notes (Addendum)
Subjective:   Cath reassuring. Prior Transient bradycardia, AFIB (30's), ECHO done. See below. No CP. No SOB. Anxious.  Objective:  Vital Signs in the last 24 hours: Temp:  [97.9 F (36.6 C)-98.8 F (37.1 C)] 97.9 F (36.6 C) (12/21 0430) Pulse Rate:  [0-89] 87 (12/21 0430) Resp:  [9-23] 20 (12/21 0430) BP: (109-188)/(45-147) 114/45 mmHg (12/21 0430) SpO2:  [0 %-98 %] 94 % (12/21 0430) Weight:  [131 lb 9.6 oz (59.693 kg)-132 lb 1.6 oz (59.92 kg)] 132 lb 1.6 oz (59.92 kg) (12/21 0430)  Intake/Output from previous day: 12/20 0701 - 12/21 0700 In: 777.4 [P.O.:180; I.V.:597.4] Out: 1400 [Urine:1400]   Physical Exam: General: Well developed, well nourished, in no acute distress. Head:  Normocephalic and atraumatic. Lungs: Clear to auscultation and percussion. Heart: Normal S1 and S2. (Metallic sound not as prominent as expected). 2/6 S murmur, rubs or gallops. Radial site normal Abdomen: soft, non-tender, positive bowel sounds. Extremities: No clubbing or cyanosis. No edema. Neurologic: Alert and oriented x 3. Mildly Anxious    Lab Results:  Recent Labs  01/09/15 0345 01/10/15 0640  WBC 5.9 10.8*  HGB 12.9 13.6  PLT 205 215    Recent Labs  01/09/15 0345 01/10/15 0640  NA 136 137  K 3.8 3.7  CL 103 103  CO2 27 25  GLUCOSE 97 105*  BUN 6 6  CREATININE 0.83 0.89    Recent Labs  01/08/15 0735 01/08/15 1332  TROPONINI 4.43* 4.35*   Hepatic Function Panel  Recent Labs  01/10/15 0640  PROT 6.7  ALBUMIN 3.9  AST 33  ALT 19  ALKPHOS 68  BILITOT 1.9*   No results for input(s): CHOL in the last 72 hours. No results for input(s): PROTIME in the last 72 hours.  Imaging: Ct Chest W Contrast  01/09/2015  CLINICAL DATA:  69 year old female with dysphagia. EXAM: CT CHEST, ABDOMEN, AND PELVIS WITH CONTRAST TECHNIQUE: Multidetector CT imaging of the chest, abdomen and pelvis was performed following the standard protocol during bolus administration of  intravenous contrast. CONTRAST:  80mL OMNIPAQUE IOHEXOL 300 MG/ML  SOLN COMPARISON:  None. FINDINGS: There is emphysematous changes of the lungs. No focal consolidation, pleural effusion, or pneumothorax. The central airways are patent. There is atherosclerotic calcification of the thoracic aorta. No aneurysm or dissection. The central pulmonary arteries appear patent. There is severe cardiomegaly. Mechanical mitral and aortic valves. No pericardial effusion. A slightly enlarged left hilar lymph node measuring 1.5 cm in short axis. Mildly prominent subcarinal lymph nodes noted. The visualized esophagus is grossly unremarkable. The thyroid gland is not well visualized. There is no axillary adenopathy. The chest wall soft tissues appear unremarkable. No intra-abdominal free air or free fluid. There is minimal irregularity of the hepatic contour. The gallbladder is not visualized, likely surgically absent. Mild periportal edema. The pancreas, spleen, and adrenal glands appear unremarkable. Bilateral renal cortical irregularity, likely related to recurrent infarct and scarring. A 1.7 cm right renal cyst. Subcentimeter left renal hypodense lesion is too small to characterize. There is no hydronephrosis on either side. The visualized ureters and urinary bladder appear unremarkable. Small uterine calcification may represent calcified fibroids. Ultrasound may provide better evaluation of the pelvic structures. There is mild apparent thickening of the proximal duodenum, likely related to underdistention. Duodenitis is less likely. Clinical correlation is recommended. There is a 1.8 cm nodular density along the wall of the proximal duodenum (coronal series 5, image 33 and axial series 2 image 76). This area of  the duodenum is not evaluated due to is non opacification with oral contrast. This may represent a focal area of thickening or a duodenal diverticulum. Other etiologies are not excluded. Endoscopy may provide better  evaluation if clinically indicated. There is sigmoid diverticulosis without active inflammation. Moderate stool noted throughout the colon. No evidence of bowel obstruction. Appendectomy. Advanced aortoiliac atherosclerotic disease. The origins of the celiac axis, SMA, IMA as well as the origins of the renal arteries remain patent. No portal venous gas identified. There is retrograde flow of contrast into the IVC and hepatic veins compatible with a degree of right heart dysfunction. There is no adenopathy in the abdomen pelvis. There is multilevel degenerative changes of the spine. No acute fracture. Grade 1 L2-L3 retrolisthesis. Median sternotomy wires. IMPRESSION: Emphysema.  No focal consolidation or pneumothorax. Severe cardiomegaly with evidence of right cardiac dysfunction. No evidence of bowel obstruction. Apparent mild thickening of the duodenal wall, likely related to underdistention. Duodenitis is not excluded. Clinical correlation is recommended. Focal nodular thickening along the duodenal wall may represent a diverticulum. Other etiologies are not excluded. Endoscopy may provide better evaluation. Electronically Signed   By: Elgie Collard M.D.   On: 01/09/2015 02:04   Ct Abdomen Pelvis W Contrast  01/09/2015  CLINICAL DATA:  69 year old female with dysphagia. EXAM: CT CHEST, ABDOMEN, AND PELVIS WITH CONTRAST TECHNIQUE: Multidetector CT imaging of the chest, abdomen and pelvis was performed following the standard protocol during bolus administration of intravenous contrast. CONTRAST:  80mL OMNIPAQUE IOHEXOL 300 MG/ML  SOLN COMPARISON:  None. FINDINGS: There is emphysematous changes of the lungs. No focal consolidation, pleural effusion, or pneumothorax. The central airways are patent. There is atherosclerotic calcification of the thoracic aorta. No aneurysm or dissection. The central pulmonary arteries appear patent. There is severe cardiomegaly. Mechanical mitral and aortic valves. No pericardial  effusion. A slightly enlarged left hilar lymph node measuring 1.5 cm in short axis. Mildly prominent subcarinal lymph nodes noted. The visualized esophagus is grossly unremarkable. The thyroid gland is not well visualized. There is no axillary adenopathy. The chest wall soft tissues appear unremarkable. No intra-abdominal free air or free fluid. There is minimal irregularity of the hepatic contour. The gallbladder is not visualized, likely surgically absent. Mild periportal edema. The pancreas, spleen, and adrenal glands appear unremarkable. Bilateral renal cortical irregularity, likely related to recurrent infarct and scarring. A 1.7 cm right renal cyst. Subcentimeter left renal hypodense lesion is too small to characterize. There is no hydronephrosis on either side. The visualized ureters and urinary bladder appear unremarkable. Small uterine calcification may represent calcified fibroids. Ultrasound may provide better evaluation of the pelvic structures. There is mild apparent thickening of the proximal duodenum, likely related to underdistention. Duodenitis is less likely. Clinical correlation is recommended. There is a 1.8 cm nodular density along the wall of the proximal duodenum (coronal series 5, image 33 and axial series 2 image 76). This area of the duodenum is not evaluated due to is non opacification with oral contrast. This may represent a focal area of thickening or a duodenal diverticulum. Other etiologies are not excluded. Endoscopy may provide better evaluation if clinically indicated. There is sigmoid diverticulosis without active inflammation. Moderate stool noted throughout the colon. No evidence of bowel obstruction. Appendectomy. Advanced aortoiliac atherosclerotic disease. The origins of the celiac axis, SMA, IMA as well as the origins of the renal arteries remain patent. No portal venous gas identified. There is retrograde flow of contrast into the IVC and hepatic veins compatible  with a  degree of right heart dysfunction. There is no adenopathy in the abdomen pelvis. There is multilevel degenerative changes of the spine. No acute fracture. Grade 1 L2-L3 retrolisthesis. Median sternotomy wires. IMPRESSION: Emphysema.  No focal consolidation or pneumothorax. Severe cardiomegaly with evidence of right cardiac dysfunction. No evidence of bowel obstruction. Apparent mild thickening of the duodenal wall, likely related to underdistention. Duodenitis is not excluded. Clinical correlation is recommended. Focal nodular thickening along the duodenal wall may represent a diverticulum. Other etiologies are not excluded. Endoscopy may provide better evaluation. Electronically Signed   By: Elgie Collard M.D.   On: 01/09/2015 02:04   Personally viewed.   Telemetry: Transient bradycardia 30's asymptomatic, AFIB Personally viewed.   EKG:  AFIB 70's TWI new V5-6. Personally viewed.  Cardiac Studies:  ECHO 01/08/15: - Left ventricle: Diffuse hypokinesis worse in the septum and apex. The cavity size was mildly dilated. Wall thickness was normal. Systolic function was mildly to moderately reduced. The estimated ejection fraction was in the range of 40% to 45%. - Aortic valve: Mechanical AVR with no significant perivalvular regurgitation Systolic gradients mildly elevated. Valve area (VTI): 1.16 cm^2. Valve area (Vmax): 1.14 cm^2. Valve area (Vmean): 1.32 cm^2. - Mitral valve: Mechanical MV with normal diastolic gradients and no significant peri valvular regurgitations. Valve area by continuity equation (using LVOT flow): 2 cm^2. - Left atrium: The atrium was severely dilated. - Right atrium: The atrium was moderately dilated. - Atrial septum: No defect or patent foramen ovale was identified. - Tricuspid valve: There was moderate-severe regurgitation. - Pulmonary arteries: PA peak pressure: 58 mm Hg (S). - Impressions: Consider TEE to further evaluate mechanical AV/MV  ifclinically indicated.  Meds: Scheduled Meds: . docusate sodium  100 mg Oral BID  . fluticasone  2 spray Each Nare QPM  . guaiFENesin  600 mg Oral BID  . levothyroxine  50 mcg Oral QHS  . lidocaine  1 patch Transdermal Q24H  . losartan  50 mg Oral QPM  . metoprolol tartrate  12.5 mg Oral QPM  . montelukast  10 mg Oral q morning - 10a  . nicotine  21 mg Transdermal Daily  . sodium chloride  3 mL Intravenous Q12H  . sucralfate  1 g Oral QID  . Umeclidinium Bromide  1 puff Inhalation QHS   Continuous Infusions: . heparin 1,300 Units/hr (01/10/15 0857)   PRN Meds:.sodium chloride, acetaminophen, ALPRAZolam, iohexol, ipratropium-albuterol, morphine injection, ondansetron **OR** ondansetron (ZOFRAN) IV, oxymetazoline, promethazine, sodium chloride  Assessment/Plan:  Principal Problem:   NSTEMI (non-ST elevated myocardial infarction) (HCC) Active Problems:   Hypothyroidism   COPD (chronic obstructive pulmonary disease) (HCC)   Tobacco use   Elevated troponin   Hypertension   S/P mitral valve replacement with metallic valve   Atrial fibrillation (HCC)   Subtherapeutic international normalized ratio (INR)   S/P aortic valve replacement with metallic valve   AFIB   - permanent  - will stop digoxin 0.25 since transient bradycardia  - continue low dose metoprolol 12.5 bid  NSTEMI  - Trop 4  - Thankfully no obstructive CAD. 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 CT scan of chest demonstrates coronary anatomy and left system (LAD) traverses lateral to PA (not dangerous). No need to obtain outpatient CT of coronaries. Looked at scan together with Dr. [place]. Explained to family.   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   - Discussed with Dr. Rockne [place]engh[place]Zakhary in  [place].   PVD - Dr. Edilia Boickson reviewed/ consulted by ER  - Had complaint of cold leg which prompted consult - diffuse tibial dz. ABI ordered - carotid duplex ordered  - No evidence of acute arterial occlusion (See Dr. Edilia Boickson consult note)  - ABI reassuring  TOB use - 1ppd - Cessation  - needs to stop to help with PVD  Prior history of brain aneurysm (history of 4 brain stents placement (last 10/2013) - stable - MRI of brain done  Aortic valve replacement and mitral valve replacement 1995 [facility] - both mechanical - no CABG  Prior right femoral artery exploration and repair  - [facility] 06/2013  - retroperitoneal hematoma on CT A/P. OR with vascular surgery femoral artery repair  Chronic anticoagulation  - given mechanical mitral and aortic, I would like to bridge her here in the hospital with heparin IV  - [facility].   - she is also worried about cost of outpatient lovenox.   - spoke with [place] cardiologist in [place], he is in agreement with plan.   Tyra Gural 01/10/2015, 10:49 AM

## 2015-01-10 NOTE — Progress Notes (Signed)
PROGRESS NOTE  Jasmine West LKG:[id number] DOB: Jan 26, 1945 DOA: 01/07/2015 PCP: Elise Benne, MD  HPI/Recap of past 24 hours: Patient is a 69 year old female past oral history of atrial fibrillation, brain aneurysm status post brain stent 4, COPD with ongoing tobacco abuse and status post mechanical aortic and mitral valve replacement initially presented to [facility] on 12/18 with complaints of left-sided neck, upper extremity and lower extremity on sensation plus left foot pain who was concerned she may have had an embolic stroke even that her foot felt cool and so she came for further evaluation. Patient transferred down to [facility] for vascular surgery evaluation who after checking CT angiogram noted infra inguinal arterial occlusive disease but no evidence of acute arterial occlusion. Patient was evaluated by neurology who recommended MRI. Her troponin was also noted to be mildly elevated at 0.86 hospitalist will call for further evaluation and admission. Patient's chronic Coumadin was subtherapeutic on admission  Following admission, MRI of the brain unremarkable. Given presenting symptoms of left facial and neck pain, asked her surgery followed up ordering a carotid duplex scan. However, her repeat troponin jumped to 3.7 and another after that was 4.2. Cardiology consulted who evaluated patient and recommended cardiac catheterization. EKG noted some ST depressions. Her left sided symptoms have since resolved following admission. 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 CT scan of chest done earlier demonstrated coronary anatomy and left system traversing lateral to pulmonary artery which is not dangerous. It was felt that given no obstructive CAD, no need  to obtain outpatient CT of coronaries. After follow-up by vascular surgery, there is no evidence of acute arterial occlusion with ABIs providing reassurance.  Assessment/Plan: Principal Problem:  non-STEMI. Cardiology following. Plan now is to resume anticoagulation and discharged home once INR therapeutic. Patient unable to afford Lovenox, so will continue on heparin bridge until then. Active Problems:   Hypothyroidism: Continue Synthroid   COPD (chronic obstructive pulmonary disease) (HCC): Stable. When necessary nebulizers. Breathing comfortably on room air   Tobacco use: Nicotine patch. We've had an extensive discussion about the need for her to quit smoking given peripheral vascular disease. Suggested several ideas including decreasing by one cigarette weekly and also hiding them from plain sight and not being easily available at home. Patient does seem motivated and encouraged by this   Hypertension   Atrial fibrillation (HCC): Persistent. INR subtherapeutic. Chads 2 vessel score of 6. As mentioned her above, continue in-hospital until INR therapeutic, heparin bridge Chronic systolic heart failure: Noted by cardiac cath. Euvolemic. 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  Antibiotics:  None   Objective: BP 114/45 mmHg  Pulse 87  Temp(Src) 99 F (37.2 C) (Oral)  Resp 20  Ht  (1.702 m)  Wt 59.92 kg (132 lb 1.6 oz)  BMI 20.68 kg/m2  SpO2 94%  Intake/Output Summary (Last 24 hours) at 01/10/15 1503 Last data filed  at 01/10/15 1156  Gross per 24 hour  Intake 896.84 ml  Output    600 ml  Net 296.84 ml   Filed Weights   01/08/15 1040 01/09/15 1735 01/10/15 0430  Weight: 60.147 kg (132 lb 9.6 oz) 59.693 kg (131 lb 9.6 oz) 59.92 kg (132 lb 1.6 oz)    Exam: About the same from previous day  General:  Alert and oriented 3, anxious   Cardiovascular: Irregular rhythm, 2/6 systolic ejection murmur , rate controlled  Respiratory: Decreased breath sounds throughout   Abdomen: Soft, nontender, nondistended, positive bowel sounds   Musculoskeletal: No clubbing or cyanosis or edema    Data Reviewed: Basic Metabolic Panel:  Recent Labs Lab 01/07/15 1638 01/07/15 1646 01/08/15 0228 01/09/15 0345 01/10/15 0640  NA 136 137 134* 136 137  K 3.8 3.8 4.0 3.8 3.7  CL 99* 96* 103 103 103  CO2 28  --  GLUCOSE 119* 114* 100* 97 105*  BUN CREATININE 0.83 0.90 0.76 0.83 0.89  CALCIUM 9.3  --  8.6* 8.3* 8.8*   Liver Function Tests:  Recent Labs Lab 01/07/15 1638 01/08/15 0228 01/09/15 0345 01/10/15 0640  AST 36 41 40 33  ALT ALKPHOS 76 63 63 68  BILITOT 1.1 1.1 1.4* 1.9*  PROT 7.0 5.6* 6.1* 6.7  ALBUMIN 4.3 3.5 3.6 3.9   No results for input(s): LIPASE, AMYLASE in the last 168 hours. No results for input(s): AMMONIA in the last 168 hours. CBC:  Recent Labs Lab 01/07/15 1638 01/07/15 1646 01/08/15 0228 01/09/15 0345 01/10/15 0640  WBC 6.7  --  6.1 5.9 10.8*  NEUTROABS 4.9  --  4.2 4.3 9.4*  HGB 13.4 14.6 12.5 12.9 13.6  HCT 39.5 43.0 37.3 39.0 41.9  MCV 96.1  --  96.6 97.5 97.9  PLT 210  --  187 205 215   Cardiac Enzymes:    Recent Labs Lab 01/08/15 0228 01/08/15 0735 01/08/15 1332  TROPONINI 3.73* 4.43* 4.35*   BNP (last 3 results)  Recent Labs  06/07/14 1149  BNP 168.0*    ProBNP (last 3 results) No results for input(s): PROBNP in the last 8760 hours.  CBG:  Recent Labs Lab 01/07/15 1641  GLUCAP 123*    Recent  Results (from the past 240 hour(s))  MRSA PCR Screening     Status: None   Collection Time: 01/08/15 11:52 AM  Result Value Ref Range Status   MRSA by PCR NEGATIVE NEGATIVE Final    Comment:        The GeneXpert MRSA Assay (FDA approved for NASAL specimens only), is one component of a comprehensive MRSA colonization surveillance program. It is not intended to diagnose MRSA infection nor to guide or monitor treatment for MRSA infections.      Studies: No results found.  Scheduled Meds: . docusate sodium  100 mg Oral BID  . fluticasone  2 spray Each Nare QPM  . furosemide  40 mg Oral Daily  . guaiFENesin  600 mg Oral BID  . levothyroxine  50 mcg Oral QHS  . lidocaine  1 patch Transdermal Q24H  . losartan  50 mg Oral QPM  . metoprolol tartrate  12.5 mg Oral QPM  . montelukast  10 mg Oral q morning - 10a  . nicotine  21 mg Transdermal Daily  . sodium chloride  3 mL Intravenous Q12H  . sucralfate  1 g Oral QID  . Umeclidinium Bromide  1 puff Inhalation QHS  . Warfarin - Pharmacist Dosing Inpatient   Does not apply q1800    Continuous Infusions: . heparin 1,300 Units/hr (01/10/15 0857)     Time spent: 25 minutes   Hollice EspyKRISHNAN,Thessaly Mccullers K  [facility] Pager [phone] . If 7PM-7AM, please contact night-coverage at [email or web address], password [email or web address] 01/10/2015, 3:03 PM  LOS: 3 days

## 2015-01-10 NOTE — Progress Notes (Signed)
VASCULAR LAB PRELIMINARY  ARTERIAL  ABI completed: Bilateral ABI appears normal. However, these values may be falsely elevated due to calcified vessels.  Bilateral TBI are abnormal.     RIGHT    LEFT    PRESSURE WAVEFORM  PRESSURE WAVEFORM  BRACHIAL 159 Triphasic BRACHIAL [place] Triphasic  DP 180 Triphasic DP 255 [place] Biphasic  PT 193 Triphasic PT 197 Triphasic  GREAT TOE 0.55  GREAT TOE 0.30     RIGHT LEFT  ABI 1.2 1.2     Arpi Diebold D, RVT 01/10/2015, 10:03 AM

## 2015-01-10 NOTE — Plan of Care (Signed)
Problem: Education: Goal: Knowledge of [place] [facility] Education information/materials will improve Outcome: Progressing On going.  Problem: Safety: Goal: Ability to remain free from injury will improve Outcome: Progressing Call bell and Bedside commode utilized for safety.   Problem: Pain Managment: Goal: General experience of comfort will improve Outcome: Progressing On going

## 2015-01-10 NOTE — Progress Notes (Signed)
ANTICOAGULATION CONSULT NOTE - Follow Up Consult  Pharmacy Consult for Heparin and Coumadin Indication: hx mechanical mitral and aortic valves  Allergies  Allergen Reactions  . Sulfa Antibiotics Photosensitivity   Patient Measurements: Height: 5\' 7"  (170.2 cm) Weight: 132 lb 1.6 oz (59.92 kg) IBW/kg (Calculated) : 61.6 Heparin Dosing Weight: 53 kg  Vital Signs: Temp: 99 F (37.2 C) (12/21 1455) Temp Source: Oral (12/21 1455) BP: 157/107 mmHg (12/21 1620) Pulse Rate: 53 (12/21 1620)  Labs:  Recent Labs  01/08/15 0228 01/08/15 0631 01/08/15 0735  01/08/15 1332  01/09/15 0345 01/09/15 1250 01/10/15 0640 01/10/15 1910  HGB 12.5  --   --   --   --   --  12.9  --  13.6  --   HCT 37.3  --   --   --   --   --  39.0  --  41.9  --   PLT 187  --   --   --   --   --  205  --  215  --   LABPROT  --  21.1*  --   --   --   --  19.9*  --  17.5*  --   INR  --  1.83*  --   --   --   --  1.70*  --  1.43  --   HEPARINUNFRC <0.10*  --   --   < >  --   < > 0.23* 0.73* 0.21* 0.45  CREATININE 0.76  --   --   --   --   --  0.83  --  0.89  --   TROPONINI 3.73*  --  4.43*  --  4.35*  --   --   --   --   --   < > = values in this interval not displayed.  Estimated Creatinine Clearance: 57.2 mL/min (by C-G formula based on Cr of 0.89).  Assessment:   [facility].  Was on Coumadin prior to admission but held for cath, done 12/20.   Heparin level now within desired goal range at 0.45 after rate increase.  No noted bleeding complications.    Home Coumadin regimen: 5 mg daily except 7.5 mg on Mondays, but INR only 1.82 on admit, and down to 1.43 today.   Hx retroperitoneal bleed 06/2013 after femoral access for cerebral stent procedure.  Goal of Therapy:  Heparin level 0.3-0.7 units/ml 2.5-3.5 Monitor platelets by anticoagulation protocol: Yes   Plan:   - Continue IV Heparin drip at 1300 units/hr  - F/U AM labs and adjust as needed  Resume Coumadin with 10 mg x 1 today.  Discussed with patient and husband.  Daily heparin level, PT/INR and CBC.  Nadara MustardNita Darius Lundberg, PharmD., MS Clinical Pharmacist Pager:  [phone] Thank you for allowing pharmacy to be part of this patients care team. 01/10/2015,7:45 PM

## 2015-01-10 NOTE — Care Management Important Message (Signed)
Important Message  Patient Details  Name: Jasmine PattersonMary West MRN: [id number] Date of Birth: 01/08/1946   Medicare Important Message Given:  Yes    Kyla BalzarineShealy, Eddy Liszewski [place] 01/10/2015, 5:07 PM

## 2015-01-10 NOTE — Progress Notes (Signed)
ANTICOAGULATION CONSULT NOTE - Follow Up Consult  Pharmacy Consult for Heparin and Coumadin Indication: hx mechanical mitral and aortic valves  Allergies  Allergen Reactions  . Sulfa Antibiotics Photosensitivity    Patient Measurements: Height: 5\' 7"  (170.2 cm) Weight: 132 lb 1.6 oz (59.92 kg) IBW/kg (Calculated) : 61.6 Heparin Dosing Weight: 53 kg  Vital Signs: Temp: 97.9 F (36.6 C) (12/21 0430) Temp Source: Oral (12/21 0430) BP: 114/45 mmHg (12/21 0430) Pulse Rate: 87 (12/21 0430)  Labs:  Recent Labs  01/07/15 1638  01/08/15 0228 01/08/15 0631 01/08/15 0735  01/08/15 1332  01/09/15 0345 01/09/15 1250 01/10/15 0640  HGB 13.4  < > 12.5  --   --   --   --   --  12.9  --  13.6  HCT 39.5  < > 37.3  --   --   --   --   --  39.0  --  41.9  PLT 210  --  187  --   --   --   --   --  205  --  215  APTT 35  --   --   --   --   --   --   --   --   --   --   LABPROT 21.0*  --   --  21.1*  --   --   --   --  19.9*  --  17.5*  INR 1.82*  --   --  1.83*  --   --   --   --  1.70*  --  1.43  HEPARINUNFRC  --   --  <0.10*  --   --   < >  --   < > 0.23* 0.73* 0.21*  CREATININE 0.83  < > 0.76  --   --   --   --   --  0.83  --  0.89  TROPONINI  --   --  3.73*  --  4.43*  --  4.35*  --   --   --   --   < > = values in this interval not displayed.  Estimated Creatinine Clearance: 57.2 mL/min (by C-G formula based on Cr of 0.89).  Assessment:   [facility]VR [facility].  Was on Coumadin prior to admission but held for cath, done 12/20.   Heparin resumed last night ~8 hrs post-cath; heparin level low this am (0.21) on 1150 units/hr.  Coumadin to resume today.   Home Coumadin regimen: 5 mg daily except 7.5 mg on Mondays, but INR only 1.82 on admit, and down to 1.43 today.   Hx retroperitoneal bleed 06/2013 after femoral access for cerebral stent procedure.  Goal of Therapy:  Heparin level 0.3-0.7 units/ml 2.5-3.5 Monitor platelets by anticoagulation protocol: Yes   Plan:   Heparin drip increased to 1300 units/hr earlier this am.  Heparin level ~6 hrs after rate change.  Resume Coumadin with 10 mg x 1 today. Discussed with patient and husband.  Daily heparin level, PT/INR and CBC.  Dennie FettersEgan, Loucile Posner Donovan, RPh Pager: [phone] 01/10/2015,11:26 AM

## 2015-01-11 DIAGNOSIS — Q245 Malformation of coronary vessels: Secondary | ICD-10-CM

## 2015-01-11 DIAGNOSIS — R931 Abnormal findings on diagnostic imaging of heart and coronary circulation: Secondary | ICD-10-CM

## 2015-01-11 DIAGNOSIS — I7090 Unspecified atherosclerosis: Secondary | ICD-10-CM

## 2015-01-11 DIAGNOSIS — I749 Embolism and thrombosis of unspecified artery: Secondary | ICD-10-CM

## 2015-01-11 LAB — CBC
HCT: 40.6 % (ref 36.0–46.0)
Hemoglobin: 13.1 g/dL (ref 12.0–15.0)
MCH: 31.6 pg (ref 26.0–34.0)
MCHC: 32.3 g/dL (ref 30.0–36.0)
MCV: 98.1 fL (ref 78.0–100.0)
PLATELETS: 208 10*3/uL (ref 150–400)
RBC: 4.14 MIL/uL (ref 3.87–5.11)
RDW: 17 % — ABNORMAL HIGH (ref 11.5–15.5)
WBC: 11.1 10*3/uL — ABNORMAL HIGH (ref 4.0–10.5)

## 2015-01-11 LAB — HEPARIN LEVEL (UNFRACTIONATED): Heparin Unfractionated: 0.34 IU/mL (ref 0.30–0.70)

## 2015-01-11 LAB — PROTIME-INR
INR: 1.63 — ABNORMAL HIGH (ref 0.00–1.49)
Prothrombin Time: 19.3 seconds — ABNORMAL HIGH (ref 11.6–15.2)

## 2015-01-11 MED ORDER — MAGNESIUM HYDROXIDE 400 MG/5ML PO SUSP
30.0000 mL | Freq: Every day | ORAL | Status: DC | PRN
  Administered 2015-01-11: 30 mL via ORAL
  Filled 2015-01-11 (×3): qty 30

## 2015-01-11 MED ORDER — TRAMADOL HCL 50 MG PO TABS
50.0000 mg | ORAL_TABLET | Freq: Four times a day (QID) | ORAL | Status: DC | PRN
  Filled 2015-01-11: qty 1

## 2015-01-11 MED ORDER — OXYCODONE-ACETAMINOPHEN 5-325 MG PO TABS
1.0000 | ORAL_TABLET | ORAL | Status: DC | PRN
Start: 2015-01-11 — End: 2015-01-13
  Administered 2015-01-11 – 2015-01-12 (×5): 2 via ORAL
  Administered 2015-01-13 (×2): 1 via ORAL
  Administered 2015-01-13: 2 via ORAL
  Filled 2015-01-11 (×3): qty 2
  Filled 2015-01-11 (×2): qty 1
  Filled 2015-01-11 (×3): qty 2

## 2015-01-11 MED ORDER — GUAIFENESIN-DM 100-10 MG/5ML PO SYRP
5.0000 mL | ORAL_SOLUTION | ORAL | Status: DC | PRN
Start: 2015-01-11 — End: 2015-01-13
  Administered 2015-01-11: 5 mL via ORAL
  Filled 2015-01-11: qty 5

## 2015-01-11 MED ORDER — ALPRAZOLAM 0.5 MG PO TABS
1.0000 mg | ORAL_TABLET | Freq: Three times a day (TID) | ORAL | Status: DC | PRN
  Administered 2015-01-11: 1 mg via ORAL
  Administered 2015-01-12: 0.5 mg via ORAL
  Administered 2015-01-12 (×2): 1 mg via ORAL
  Administered 2015-01-12: 0.5 mg via ORAL
  Administered 2015-01-13: 1 mg via ORAL
  Filled 2015-01-11 (×6): qty 2

## 2015-01-11 MED ORDER — WARFARIN SODIUM 10 MG PO TABS
10.0000 mg | ORAL_TABLET | Freq: Once | ORAL | Status: AC
  Administered 2015-01-11: 10 mg via ORAL
  Filled 2015-01-11: qty 1

## 2015-01-11 NOTE — Progress Notes (Signed)
ANTICOAGULATION CONSULT NOTE - Follow Up Consult  Pharmacy Consult for Heparin and Coumadin Indication: hx mechanical mitral and aortic valves  Allergies  Allergen Reactions  . Sulfa Antibiotics Photosensitivity   Patient Measurements: Height: 5\' 7"  (170.2 cm) Weight: 127 lb 11.2 oz (57.924 kg) IBW/kg (Calculated) : 61.6 Heparin Dosing Weight: 53 kg  Vital Signs: Temp: 98.2 F (36.8 C) (12/22 0515) Temp Source: Oral (12/22 0515) BP: 118/63 mmHg (12/22 0943) Pulse Rate: 77 (12/22 0520)  Labs:  Recent Labs  01/08/15 1332  01/09/15 0345  01/10/15 0640 01/10/15 1910 01/11/15 0425  HGB  --   < > 12.9  --  13.6  --  13.1  HCT  --   --  39.0  --  41.9  --  40.6  PLT  --   --  205  --  215  --  208  LABPROT  --   --  19.9*  --  17.5*  --  19.3*  INR  --   --  1.70*  --  1.43  --  1.63*  HEPARINUNFRC  --   < > 0.23*  < > 0.21* 0.45 0.34  CREATININE  --   --  0.83  --  0.89  --   --   TROPONINI 4.35*  --   --   --   --   --   --   < > = values in this interval not displayed.  Estimated Creatinine Clearance: 55.3 mL/min (by C-G formula based on Cr of 0.89).  Assessment: [facility]  Was on Coumadin prior to admission but held for cath, done 12/20.  Coumadin resumed 12/21. Heparin level now within desired goal range at 0.34.  INR trending up to 1.63 today.  No noted bleeding complications.  Home Coumadin regimen: 5 mg daily except 7.5 mg on Mondays, but INR only 1.82 on admit. Hx retroperitoneal bleed 06/2013 after femoral access for cerebral stent procedure.  Goal of Therapy:  Heparin level 0.3-0.7 units/ml 2.5-3.5 Monitor platelets by anticoagulation protocol: Yes   Plan:   - Continue IV Heparin drip at 1300 units/hr  - F/U AM labs and adjust as needed  - Coumadin 10 mg x 1 today. Expect INR to continue to rise on doses above home dose.  - Daily heparin level, PT/INR and CBC.  Tad MooreJessica Skylen Spiering, Pharm D, BCPS  Clinical Pharmacist Pager [phone]  [phone]  01/11/2015 10:02 AM

## 2015-01-11 NOTE — Progress Notes (Signed)
Patient Name: Jasmine West Date of Encounter: 01/11/2015  Primary Cardiologist: Dr. Ave Filter Jasmine West (Jul. 14, 2015 - Present) [phone] (Work)   Principal Problem:   NSTEMI (non-ST elevated myocardial infarction) [facility]) Active Problems:   Hypothyroidism   COPD (chronic obstructive pulmonary disease) (HCC)   Tobacco use   Elevated troponin   Hypertension   S/P mitral valve replacement with metallic valve   Atrial fibrillation (HCC)   Subtherapeutic international normalized ratio (INR)   S/P aortic valve replacement with metallic valve    SUBJECTIVE  Never had CP. The L temple numbness and pain went away. Has some headache after flu shot. No significant leg pain.   CURRENT MEDS . docusate sodium  100 mg Oral BID  . fluticasone  2 spray Each Nare QPM  . furosemide  40 mg Oral Daily  . guaiFENesin  600 mg Oral BID  . levothyroxine  50 mcg Oral QHS  . lidocaine  1 patch Transdermal Q24H  . losartan  50 mg Oral QPM  . metoprolol tartrate  12.5 mg Oral QPM  . montelukast  10 mg Oral q morning - 10a  . nicotine  21 mg Transdermal Daily  . sodium chloride  3 mL Intravenous Q12H  . sucralfate  1 g Oral QID  . Umeclidinium Bromide  1 puff Inhalation QHS  . Warfarin - Pharmacist Dosing Inpatient   Does not apply q1800    OBJECTIVE  Filed Vitals:   01/10/15 2051 01/11/15 0513 01/11/15 0515 01/11/15 0520  BP: 153/70   154/77  Pulse: 79   77  Temp: 99.5 F (37.5 C)  98.2 F (36.8 C)   TempSrc: Oral  Oral   Resp: 23   17  Height:      Weight:  127 lb 11.2 oz (57.924 kg)    SpO2: 98%   96%    Intake/Output Summary (Last 24 hours) at 01/11/15 0740 Last data filed at 01/11/15 0700  Gross per 24 hour  Intake 1047.34 ml  Output   1825 ml  Net -777.66 ml   Filed Weights   01/09/15 1735 01/10/15 0430 01/11/15 0513  Weight: 131 lb 9.6 oz (59.693 kg) 132 lb 1.6 oz (59.92 kg) 127 lb 11.2 oz (57.924 kg)    PHYSICAL EXAM  General: Pleasant,  NAD. Neuro: Alert and oriented X 3. Moves all extremities spontaneously. Psych: Normal affect. HEENT:  Normal  Neck: Supple without bruits or JVD. Lungs:  Resp regular and unlabored, CTA. Heart: irregular. no s3, s4, or murmurs. Abdomen: Soft, non-tender, non-distended, BS + x 4.  Extremities: No clubbing, cyanosis or edema. DP/PT/Radials 2+ and equal bilaterally.  Accessory Clinical Findings  CBC  Recent Labs  01/09/15 0345 01/10/15 0640 01/11/15 0425  WBC 5.9 10.8* 11.1*  NEUTROABS 4.3 9.4*  --   HGB 12.9 13.6 13.1  HCT 39.0 41.9 40.6  MCV 97.5 97.9 98.1  PLT 205 215 208   Basic Metabolic Panel  Recent Labs  01/09/15 0345 01/10/15 0640  NA 136 137  K 3.8 3.7  CL 103 103  CO2 27 25  GLUCOSE 97 105*  BUN 6 6  CREATININE 0.83 0.89  CALCIUM 8.3* 8.8*   Liver Function Tests  Recent Labs  01/09/15 0345 01/10/15 0640  AST 40 33  ALT 21 19  ALKPHOS 63 68  BILITOT 1.4* 1.9*  PROT 6.1* 6.7  ALBUMIN 3.6 3.9   Cardiac Enzymes  Recent Labs  01/08/15 1332  TROPONINI 4.35*  Thyroid Function Tests  Recent Labs  01/09/15 0345  TSH 1.081    TELE afib with HR 80-90s    ECG  No new EKG  Echocardiogram 01/08/2015  LV EF: 40% -  45%  ------------------------------------------------------------------- Indications:   TIA 435.9.  ------------------------------------------------------------------- History:  PMH:  Chronic obstructive pulmonary disease. PMH: Brain Aneurysm. Risk factors: Hypertension. Dyslipidemia.  ------------------------------------------------------------------- Study Conclusions  - Left ventricle: Diffuse hypokinesis worse in the septum and apex. The cavity size was mildly dilated. Wall thickness was normal. Systolic function was mildly to moderately reduced. The estimated ejection fraction was in the range of 40% to 45%. - Aortic valve: Mechanical AVR with no significant perivalvular regurgitation Systolic  gradients mildly elevated. Valve area (VTI): 1.16 cm^2. Valve area (Vmax): 1.14 cm^2. Valve area (Vmean): 1.32 cm^2. - Mitral valve: Mechanical MV with normal diastolic gradients and no significant peri valvular regurgitations. Valve area by continuity equation (using LVOT flow): 2 cm^2. - Left atrium: The atrium was severely dilated. - Right atrium: The atrium was moderately dilated. - Atrial septum: No defect or patent foramen ovale was identified. - Tricuspid valve: There was moderate-severe regurgitation. - Pulmonary arteries: PA peak pressure: 58 mm Hg (S). - Impressions: Consider TEE to further evaluate mechanical AV/MV if clinically indicated.  Impressions:  - Consider TEE to further evaluate mechanical AV/MV if clinically indicated.   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  RECOMMENDATION: Jasmine West is status post mechanical AVR and MVR in 1995 at [facility]. With her positive troponins, cardiac CT angiography should be performed to make certain there is not significant impingement of her anomalous coronary circulation between the PDA and aorta       Radiology/Studies  Ct Head Wo Contrast  01/07/2015  CLINICAL DATA:  Headache with left leg weakness today. On anticoagulation. History of brain aneurysm. Code stroke. Initial encounter. EXAM: CT HEAD WITHOUT CONTRAST TECHNIQUE: Contiguous axial images were obtained from the base of the skull through the vertex without intravenous contrast. COMPARISON:  Head CT 11/11/2013. FINDINGS: There is no evidence of acute intracranial hemorrhage, mass lesion, brain edema or extra-axial fluid collection. The ventricles and subarachnoid spaces are appropriately sized for age. There is no CT evidence of acute cortical infarction. There is chronic subcortical  encephalomalacia in the posterior right frontal lobe. 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 There is also an old lacunar infarct anteriorly in the left caudate body. Intracranial vascular calcifications are noted. The visualized paranasal sinuses, mastoid air cells and middle ears are clear. The calvarium is intact. TMJ degenerative changes are present bilaterally. IMPRESSION: Stable head CT demonstrating chronic small vessel ischemic changes, but no CT evidence of acute stroke or acute hemorrhage. These results were called by telephone at the time of interpretation on 01/07/2015 at 5:02 pm to Dr. KATHLEEN MCMANUS , who verbally acknowledged these results. Electronically Signed   By: William  Veazey M.D.   On: 01/07/2015 17:04   Ct Chest W Contrast  01/09/2015  CLINICAL DATA:  69 year old female with dysphagia. EXAM: CT CHEST, ABDOMEN, AND PELVIS WITH CONTRAST TECHNIQUE: Multidetector CT imaging of the chest, abdomen and pelvis was performed following the standard protocol during bolus administration of intravenous contrast. CONTRAST:  34mL OMNIPAQUE IOHEXOL 300 MG/ML  SOLN COMPARISON:  None. FINDINGS: There is emphysematous changes of the lungs. No focal consolidation, pleural effusion, or pneumothorax. The  central airways are patent. There is atherosclerotic calcification of the thoracic aorta. No aneurysm or dissection. The central pulmonary arteries appear patent. There is severe cardiomegaly. Mechanical mitral and aortic valves. No pericardial effusion. A slightly enlarged left hilar lymph node measuring 1.5 cm in short axis. Mildly prominent subcarinal lymph nodes noted. The visualized esophagus is grossly unremarkable. The thyroid gland is not well visualized. There is no axillary adenopathy. The chest wall soft tissues appear unremarkable. No intra-abdominal free air or free fluid. There is minimal irregularity of the hepatic contour. The gallbladder is not visualized, likely surgically  absent. Mild periportal edema. The pancreas, spleen, and adrenal glands appear unremarkable. Bilateral renal cortical irregularity, likely related to recurrent infarct and scarring. A 1.7 cm right renal cyst. Subcentimeter left renal hypodense lesion is too small to characterize. There is no hydronephrosis on either side. The visualized ureters and urinary bladder appear unremarkable. Small uterine calcification may represent calcified fibroids. Ultrasound may provide better evaluation of the pelvic structures. There is mild apparent thickening of the proximal duodenum, likely related to underdistention. Duodenitis is less likely. Clinical correlation is recommended. There is a 1.8 cm nodular density along the wall of the proximal duodenum (coronal series 5, image 33 and axial series 2 image 76). This area of the duodenum is not evaluated due to is non opacification with oral contrast. This may represent a focal area of thickening or a duodenal diverticulum. Other etiologies are not excluded. Endoscopy may provide better evaluation if clinically indicated. There is sigmoid diverticulosis without active inflammation. Moderate stool noted throughout the colon. No evidence of bowel obstruction. Appendectomy. Advanced aortoiliac atherosclerotic disease. The origins of the celiac axis, SMA, IMA as well as the origins of the renal arteries remain patent. No portal venous gas identified. There is retrograde flow of contrast into the IVC and hepatic veins compatible with a degree of right heart dysfunction. There is no adenopathy in the abdomen pelvis. There is multilevel degenerative changes of the spine. No acute fracture. Grade 1 L2-L3 retrolisthesis. Median sternotomy wires. IMPRESSION: Emphysema.  No focal consolidation or pneumothorax. Severe cardiomegaly with evidence of right cardiac dysfunction. No evidence of bowel obstruction. Apparent mild thickening of the duodenal wall, likely related to underdistention.  Duodenitis is not excluded. Clinical correlation is recommended. Focal nodular thickening along the duodenal wall may represent a diverticulum. Other etiologies are not excluded. Endoscopy may provide better evaluation. Electronically Signed   By: Elgie Collard M.D.   On: 01/09/2015 02:04   Ct Angio Low Extrem Left W/cm &/or Wo/cm  01/07/2015  CLINICAL DATA:  Possible ischemic left foot. EXAM: CT ANGIOGRAPHY LOWER LEFT EXTREMITY TECHNIQUE: Arterially timed CT angiography of the left lower extremity was performed from the level of the upper popliteal artery through the toes. This protocol was specifically requested. CONTRAST:  OMNIPAQUE IOHEXOL 350 MG/ML SOLN COMPARISON:  None. FINDINGS: Diffuse atheromatous wall thickening and calcification of the popliteal artery without flow limiting stenosis. Heavily diseased anterior tibial artery with underfilling and occlusion above the ankle. Chronic appearing occlusion or near occlusion of the tibioperoneal trunk with reconstituted flow to the under-filled and diffusely irregular posterior tibial artery from muscular branch, with continuation to the ankle. There is thready reconstitution of the peroneal artery, which likely accounts for the faintly opacified dorsalis pedis, reportedly detected by Doppler. No acute appearing arterial occlusion identified. There is secondary findings of inflow disease on the left: the study timing was triggered based on the left popliteal artery and there is  asymmetric venous opacification of the right lower extremity (partially seen below the knee on reformats). No soft tissue gas or evidence of osteomyelitis. These results were called by telephone at the time of interpretation on 01/07/2015 at 9:20 pm to Dr. Gerhard Munch , who verbally acknowledged these results. Review of the MIP images confirms the above findings. IMPRESSION: 1. Extensive popliteal and infrapopliteal atherosclerosis with 2 vessel runoff at the ankle. 2.  Secondary findings of left lower extremity arterial inflow disease, as above. Electronically Signed   By: Marnee Spring M.D.   On: 01/07/2015 21:34   Mr Maxine Glenn Head Wo Contrast  01/08/2015  CLINICAL DATA:  History of stroke. EXAM: MRA HEAD WITHOUT CONTRAST TECHNIQUE: Angiographic images of the Circle of Willis were obtained using MRA technique without intravenous contrast. COMPARISON:  CT head 01/07/2015 FINDINGS: Internal carotid artery is widely patent through the skullbase. There is diffuse atherosclerotic narrowing of the cavernous carotid bilaterally causing moderate to severe diffuse stenosis in both cavernous carotid, right greater than left. Supraclinoid internal carotid artery returns to normal caliber. Anterior and middle cerebral arteries normal in caliber without significant stenosis. Both vertebral arteries patent to the basilar. Left PICA widely patent. Right PICA not visualized. Basilar widely patent. Right AICA patent. Superior cerebellar and posterior cerebral arteries patent without stenosis. 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 Otherwise no significant intracranial stenosis. Electronically Signed   By: Marlan Palau M.D.   On: 01/08/2015 09:52   Ct Abdomen Pelvis W Contrast  01/09/2015  CLINICAL DATA:  69 year old female with dysphagia. EXAM: CT CHEST, ABDOMEN, AND PELVIS WITH CONTRAST TECHNIQUE: Multidetector CT imaging of the chest, abdomen and pelvis was performed following the standard protocol during bolus administration of intravenous contrast. CONTRAST:  80mL OMNIPAQUE IOHEXOL 300 MG/ML  SOLN COMPARISON:  None. FINDINGS: There is emphysematous changes of the lungs. No focal consolidation, pleural effusion, or pneumothorax. The central airways are patent. There is atherosclerotic calcification of the thoracic aorta. No aneurysm or dissection. The central pulmonary  arteries appear patent. There is severe cardiomegaly. Mechanical mitral and aortic valves. No pericardial effusion. A slightly enlarged left hilar lymph node measuring 1.5 cm in short axis. Mildly prominent subcarinal lymph nodes noted. The visualized esophagus is grossly unremarkable. The thyroid gland is not well visualized. There is no axillary adenopathy. The chest wall soft tissues appear unremarkable. No intra-abdominal free air or free fluid. There is minimal irregularity of the hepatic contour. The gallbladder is not visualized, likely surgically absent. Mild periportal edema. The pancreas, spleen, and adrenal glands appear unremarkable. Bilateral renal cortical irregularity, likely related to recurrent infarct and scarring. A 1.7 cm right renal cyst. Subcentimeter left renal hypodense lesion is too small to characterize. There is no hydronephrosis on either side. The visualized ureters and urinary bladder appear unremarkable. Small uterine calcification may represent calcified fibroids. Ultrasound may provide better evaluation of the pelvic structures. There is mild apparent thickening of the proximal duodenum, likely related to underdistention. Duodenitis is less likely. Clinical correlation is recommended. There is a 1.8 cm nodular density along the wall of the proximal duodenum (coronal series 5, image 33 and axial series 2 image 76). This area of the duodenum is not evaluated due to is non opacification with oral contrast. This may represent a focal area of thickening or a duodenal diverticulum. Other etiologies are not excluded. Endoscopy may provide better evaluation if clinically indicated. There is sigmoid diverticulosis  without active inflammation. Moderate stool noted throughout the colon. No evidence of bowel obstruction. Appendectomy. Advanced aortoiliac atherosclerotic disease. The origins of the celiac axis, SMA, IMA as well as the origins of the renal arteries remain patent. No portal venous  gas identified. There is retrograde flow of contrast into the IVC and hepatic veins compatible with a degree of right heart dysfunction. There is no adenopathy in the abdomen pelvis. There is multilevel degenerative changes of the spine. No acute fracture. Grade 1 L2-L3 retrolisthesis. Median sternotomy wires. IMPRESSION: Emphysema.  No focal consolidation or pneumothorax. Severe cardiomegaly with evidence of right cardiac dysfunction. No evidence of bowel obstruction. Apparent mild thickening of the duodenal wall, likely related to underdistention. Duodenitis is not excluded. Clinical correlation is recommended. Focal nodular thickening along the duodenal wall may represent a diverticulum. Other etiologies are not excluded. Endoscopy may provide better evaluation. Electronically Signed   By: Elgie Collard M.D.   On: 01/09/2015 02:04    ASSESSMENT AND PLAN  1. 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 Recommanded cardiac CT angiography to make sure there is no significant impingement of her anomalous coronary circulation between PDA and aorta.   - CT scan of chest demonstrates coronary anatomy and left system (LAD) traverses lateral to PA (not dangerous). No need to obtain outpatient CT of coronaries  2. Neck pain: carotid U/S 1-39% bilaterally.  3. PAD with extensive popliteal and infrapopliteal atherosclerosis with 2v runoff, no acute arterial occlusion  - underwent LE angiography initially due to leg pain, subtherapeutic INR  - ABI reassuring  4. Permanent afib on on chronic coumadin  - digoxin stopped due to transient bradycardia  - continue metoprolol, current HR 80-90s, if has frequent HR spike >100, may increase metoprolol to 25mg  BID. Otherwise she is doing well from cardiac perspective.   5. H/o  valvular dx s/p both mechanical mitral and aortic valve replacement in 1995 at [facility]  - bridge coumadin with IV heparin until therapeutic, expect discharge once therapeutic  6. Prior CVA 7. H/o brain aneurysm s/p 4 brain stent 8. HTN 9. HLD 10. Tobacco abuse: ready to quit per patient  Signed, Amedeo Plenty Pager: [phone]  Personally seen and examined. Agree with above. In chair, comfortable Awaiting INR closer.  Donato Schultz, MD

## 2015-01-11 NOTE — Progress Notes (Signed)
Patient complaining of a dry non-productive cough and headache.  [facility] paged with this information.

## 2015-01-11 NOTE — Progress Notes (Signed)
[facility]                                                                              Patient Demographics  Jasmine West, is a 69 y.o. female, DOB - Mar 04, 1945, ZOX:[id number]  Admit date - 01/07/2015   Admitting Physician Hollice Espy, MD  Outpatient Primary MD for the patient is Elise Benne, MD  LOS - 4   Chief Complaint  Patient presents with  . Leg Pain       Brief HPI   Patient is a 69 year old female past oral history of atrial fibrillation, brain aneurysm status post brain stent 4, COPD with ongoing tobacco abuse and status post mechanical aortic and mitral valve replacement initially presented to [facility] on 12/18 with complaints of left-sided neck, upper extremity and lower extremity on sensation plus left foot pain who was concerned she may have had an embolic stroke even that her foot felt cool and so she came for further evaluation. Patient transferred down to [facility] for vascular surgery evaluation who after checking CT angiogram noted infra inguinal arterial occlusive disease but no evidence of acute arterial occlusion. Patient was evaluated by neurology who recommended MRI. Her troponin was also noted to be mildly elevated at 0.86 hospitalist will call for further evaluation and admission. Patient's chronic Coumadin was subtherapeutic on admission  Following admission, MRI of the brain unremarkable. Given presenting symptoms of left facial and neck pain, asked her surgery followed up ordering a carotid duplex scan. However, her repeat troponin jumped to 3.7 and another after that was 4.2. Cardiology consulted who evaluated patient and recommended cardiac catheterization. EKG noted some ST depressions. Her left sided symptoms have since resolved following admission. 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 CT scan of chest done earlier demonstrated coronary anatomy and left system traversing lateral to pulmonary artery which is not dangerous. It was felt that given no obstructive CAD, no need to obtain outpatient CT of coronaries. After follow-up by vascular surgery, there is no evidence of acute arterial occlusion with ABIs providing reassurance.   Assessment & Plan   Principal Problem: Non-STEMI.  - Echo 12/19 showed EF of 40-45% with mechanical AVR, without significant perivalvular regurg, mechanical MR, severely dilated LA  - Cardiology was consulted. 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 This was explained to the patient and family by cardiology. - Anticoagulation has been resumed, plan to DC home when INR therapeutic. Patient unable to afford Lovenox, so will continue on heparin bridge until then. INR trending up  Active Problems:  Hypothyroidism: Continue Synthroid   COPD (chronic obstructive pulmonary disease) (HCC): Stable.  - Continue prn nebulizers. Breathing comfortably on room air   Tobacco use - Continue nicotine patch, counseled strongly on smoking cessation   Hypertension - Currently stable  H/o valvular  dx s/p both mechanical mitral and aortic valve replacement in 1995 at [facility] - Continue to bridge Coumadin with IV heparin until therapeutic INR >2.5   Atrial fibrillation (HCC): Persistent. INR subtherapeutic.  - Chads 2 vessel score of 6. As mentioned her above, continue in-hospital until INR therapeutic, heparin bridge - Digoxin discontinued, per cardiology if persistent heart rate about 100, may increase metoprolol to 25mg  BID  Chronic systolic heart failure: -  Noted  by cardiac cath. Euvolemic, on oral Lasix  Neck pain: carotid U/S 1-39% bilaterally.  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  Consults   Cardiology Vascular surgery  DVT Prophylaxis heparin +coumadin  Medications  Scheduled Meds: . docusate sodium  100 mg Oral BID  . fluticasone  2 spray Each Nare QPM  . furosemide  40 mg Oral Daily  . guaiFENesin  600 mg Oral BID  . levothyroxine  50 mcg Oral QHS  . lidocaine  1 patch Transdermal Q24H  . losartan  50 mg Oral QPM  . metoprolol tartrate  12.5 mg Oral QPM  . montelukast  10 mg Oral q morning - 10a  . nicotine  21 mg Transdermal Daily  . sodium chloride  3 mL Intravenous Q12H  . sucralfate  1 g Oral QID  . Umeclidinium Bromide  1 puff Inhalation QHS  . warfarin  10 mg Oral ONCE-1800  . Warfarin - Pharmacist Dosing Inpatient   Does not apply q1800   Continuous Infusions: . heparin 1,300 Units/hr (01/10/15 2245)   PRN Meds:.sodium chloride, acetaminophen, ALPRAZolam, guaiFENesin-dextromethorphan, iohexol, ipratropium-albuterol, morphine injection, ondansetron **OR** ondansetron (ZOFRAN) IV, oxyCODONE-acetaminophen, oxymetazoline, promethazine, sodium chloride, traMADol   Antibiotics   Anti-infectives    None        Subjective:   Jasmine West was seen and examined today. No complaints this morning, except headache. No chest pain or shortness of breath.  Patient denies dizziness, abdominal pain, N/V/D/C, new weakness, numbess, tingling. No acute events  overnight.    Objective:   Blood pressure 118/63, pulse 77, temperature 98.2 F (36.8 C), temperature source Oral, resp. rate 17, height 5\' 7"  (1.702 m), weight 57.924 kg (127 lb 11.2 oz), SpO2 97 %.  Wt Readings from Last 3 Encounters:  01/11/15 57.924 kg (127 lb 11.2 oz)  06/07/14 63.504 kg (140 lb)  05/13/14 63.504 kg (140 lb)     Intake/Output Summary (Last 24 hours) at 01/11/15 1151 Last data filed at 01/11/15 0700  Gross per 24 hour  Intake 1047.34 ml  Output   1825 ml  Net -777.66 ml    Exam  General: Alert and oriented x 3, NAD  HEENT:  PERRLA, EOMI, Anicteric Sclera, mucous membranes moist.   Neck: Supple, no JVD, no masses  CVS: S1 S2 auscultated, ireg ireg  Respiratory: Clear to auscultation bilaterally, no wheezing, rales or rhonchi  Abdomen: Soft, nontender, nondistended, + bowel sounds  Ext: no cyanosis clubbing or edema  Neuro: AAOx3, Cr N's II- XII. Strength 5/5 upper and lower extremities bilaterally  Skin: No rashes  Psych: Normal affect and  demeanor, alert and oriented x3    Data Review   Micro Results Recent Results (from the past 240 hour(s))  MRSA PCR Screening     Status: None   Collection Time: 01/08/15 11:52 AM  Result Value Ref Range Status   MRSA by PCR NEGATIVE NEGATIVE Final    Comment:        The GeneXpert MRSA Assay (FDA approved for NASAL specimens only), is one component of a comprehensive MRSA colonization surveillance program. It is not intended to diagnose MRSA infection nor to guide or monitor treatment for MRSA infections.     Radiology Reports Ct Head Wo Contrast  01/07/2015  CLINICAL DATA:  Headache with left leg weakness today. On anticoagulation. History of brain aneurysm. Code stroke. Initial encounter. EXAM: CT HEAD WITHOUT CONTRAST TECHNIQUE: Contiguous axial images were obtained from the base of the skull through the vertex without intravenous contrast. COMPARISON:  Head CT 11/11/2013. FINDINGS: There  is no evidence of acute intracranial hemorrhage, mass lesion, brain edema or extra-axial fluid collection. The ventricles and subarachnoid spaces are appropriately sized for age. There is no CT evidence of acute cortical infarction. There is chronic subcortical encephalomalacia in the posterior right frontal lobe. 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 There is also an old lacunar infarct anteriorly in the left caudate body. Intracranial vascular calcifications are noted. The visualized paranasal sinuses, mastoid air cells and middle ears are clear. The calvarium is intact. TMJ degenerative changes are present bilaterally. IMPRESSION: Stable head CT demonstrating chronic small vessel ischemic changes, but no CT evidence of acute stroke or acute hemorrhage. These results were called by telephone at the time of interpretation on 01/07/2015 at 5:02 pm to Dr. Samuel Jester , who verbally acknowledged these results. Electronically Signed   By: Carey Bullocks M.D.   On: 01/07/2015 17:04   Ct Chest W Contrast  01/09/2015  CLINICAL DATA:  69 year old female with dysphagia. EXAM: CT CHEST, ABDOMEN, AND PELVIS WITH CONTRAST TECHNIQUE: Multidetector CT imaging of the chest, abdomen and pelvis was performed following the standard protocol during bolus administration of intravenous contrast. CONTRAST:  80mL OMNIPAQUE IOHEXOL 300 MG/ML  SOLN COMPARISON:  None. FINDINGS: There is emphysematous changes of the lungs. No focal consolidation, pleural effusion, or pneumothorax. The central airways are patent. There is atherosclerotic calcification of the thoracic aorta. No aneurysm or dissection. The central pulmonary arteries appear patent. There is severe cardiomegaly. Mechanical mitral and aortic valves. No pericardial effusion. A slightly enlarged left hilar lymph node measuring 1.5 cm in short axis. Mildly prominent subcarinal lymph nodes noted. The visualized esophagus is grossly unremarkable. The thyroid gland  is not well visualized. There is no axillary adenopathy. The chest wall soft tissues appear unremarkable. No intra-abdominal free air or free fluid. There is minimal irregularity of the hepatic contour. The gallbladder is not visualized, likely surgically absent. Mild periportal edema. The pancreas, spleen, and adrenal glands appear unremarkable. Bilateral renal cortical irregularity, likely related to recurrent infarct and scarring. A 1.7 cm right renal cyst. Subcentimeter left renal hypodense lesion is too small to characterize. There is no hydronephrosis on either side. The visualized ureters and urinary bladder appear unremarkable. Small uterine calcification may represent calcified fibroids. Ultrasound may provide better evaluation of the pelvic structures. There is mild apparent thickening of the proximal duodenum, likely related to underdistention. Duodenitis is less likely. Clinical correlation is recommended. There is a 1.8 cm nodular density along the wall of the proximal duodenum (coronal series 5, image  33 and axial series 2 image 76). This area of the duodenum is not evaluated due to is non opacification with oral contrast. This may represent a focal area of thickening or a duodenal diverticulum. Other etiologies are not excluded. Endoscopy may provide better evaluation if clinically indicated. There is sigmoid diverticulosis without active inflammation. Moderate stool noted throughout the colon. No evidence of bowel obstruction. Appendectomy. Advanced aortoiliac atherosclerotic disease. The origins of the celiac axis, SMA, IMA as well as the origins of the renal arteries remain patent. No portal venous gas identified. There is retrograde flow of contrast into the IVC and hepatic veins compatible with a degree of right heart dysfunction. There is no adenopathy in the abdomen pelvis. There is multilevel degenerative changes of the spine. No acute fracture. Grade 1 L2-L3 retrolisthesis. Median sternotomy  wires. IMPRESSION: Emphysema.  No focal consolidation or pneumothorax. Severe cardiomegaly with evidence of right cardiac dysfunction. No evidence of bowel obstruction. Apparent mild thickening of the duodenal wall, likely related to underdistention. Duodenitis is not excluded. Clinical correlation is recommended. Focal nodular thickening along the duodenal wall may represent a diverticulum. Other etiologies are not excluded. Endoscopy may provide better evaluation. Electronically Signed   By: Elgie CollardArash  Radparvar M.D.   On: 01/09/2015 02:04   Ct Angio Low Extrem Left W/cm &/or Wo/cm  01/07/2015  CLINICAL DATA:  Possible ischemic left foot. EXAM: CT ANGIOGRAPHY LOWER LEFT EXTREMITY TECHNIQUE: Arterially timed CT angiography of the left lower extremity was performed from the level of the upper popliteal artery through the toes. This protocol was specifically requested. CONTRAST:  100mL OMNIPAQUE IOHEXOL 350 MG/ML SOLN COMPARISON:  None. FINDINGS: Diffuse atheromatous wall thickening and calcification of the popliteal artery without flow limiting stenosis. Heavily diseased anterior tibial artery with underfilling and occlusion above the ankle. Chronic appearing occlusion or near occlusion of the tibioperoneal trunk with reconstituted flow to the under-filled and diffusely irregular posterior tibial artery from muscular branch, with continuation to the ankle. There is thready reconstitution of the peroneal artery, which likely accounts for the faintly opacified dorsalis pedis, reportedly detected by Doppler. No acute appearing arterial occlusion identified. There is secondary findings of inflow disease on the left: the study timing was triggered based on the left popliteal artery and there is asymmetric venous opacification of the right lower extremity (partially seen below the knee on reformats). No soft tissue gas or evidence of osteomyelitis. These results were called by telephone at the time of interpretation on  01/07/2015 at 9:20 pm to Dr. Gerhard MunchOBERT LOCKWOOD , who verbally acknowledged these results. Review of the MIP images confirms the above findings. IMPRESSION: 1. Extensive popliteal and infrapopliteal atherosclerosis with 2 vessel runoff at the ankle. 2. Secondary findings of left lower extremity arterial inflow disease, as above. Electronically Signed   By: Marnee SpringJonathon  Watts M.D.   On: 01/07/2015 21:34   Mr Maxine GlennMra Head Wo Contrast  01/08/2015  CLINICAL DATA:  History of stroke. EXAM: MRA HEAD WITHOUT CONTRAST TECHNIQUE: Angiographic images of the Circle of Willis were obtained using MRA technique without intravenous contrast. COMPARISON:  CT head 01/07/2015 FINDINGS: Internal carotid artery is widely patent through the skullbase. There is diffuse atherosclerotic narrowing of the cavernous carotid bilaterally causing moderate to severe diffuse stenosis in both cavernous carotid, right greater than left. Supraclinoid internal carotid artery returns to normal caliber. Anterior and middle cerebral arteries normal in caliber without significant stenosis. Both vertebral arteries patent to the basilar. Left PICA widely patent. Right PICA not visualized. Basilar  widely patent. Right AICA patent. Superior cerebellar and posterior cerebral arteries patent without stenosis. 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 Otherwise no significant intracranial stenosis. Electronically Signed   By: Marlan Palau M.D.   On: 01/08/2015 09:52   Ct Abdomen Pelvis W Contrast  01/09/2015  CLINICAL DATA:  69 year old female with dysphagia. EXAM: CT CHEST, ABDOMEN, AND PELVIS WITH CONTRAST TECHNIQUE: Multidetector CT imaging of the chest, abdomen and pelvis was performed following the standard protocol during bolus administration of intravenous contrast. CONTRAST:  80mL OMNIPAQUE IOHEXOL 300 MG/ML  SOLN COMPARISON:  None.  FINDINGS: There is emphysematous changes of the lungs. No focal consolidation, pleural effusion, or pneumothorax. The central airways are patent. There is atherosclerotic calcification of the thoracic aorta. No aneurysm or dissection. The central pulmonary arteries appear patent. There is severe cardiomegaly. Mechanical mitral and aortic valves. No pericardial effusion. A slightly enlarged left hilar lymph node measuring 1.5 cm in short axis. Mildly prominent subcarinal lymph nodes noted. The visualized esophagus is grossly unremarkable. The thyroid gland is not well visualized. There is no axillary adenopathy. The chest wall soft tissues appear unremarkable. No intra-abdominal free air or free fluid. There is minimal irregularity of the hepatic contour. The gallbladder is not visualized, likely surgically absent. Mild periportal edema. The pancreas, spleen, and adrenal glands appear unremarkable. Bilateral renal cortical irregularity, likely related to recurrent infarct and scarring. A 1.7 cm right renal cyst. Subcentimeter left renal hypodense lesion is too small to characterize. There is no hydronephrosis on either side. The visualized ureters and urinary bladder appear unremarkable. Small uterine calcification may represent calcified fibroids. Ultrasound may provide better evaluation of the pelvic structures. There is mild apparent thickening of the proximal duodenum, likely related to underdistention. Duodenitis is less likely. Clinical correlation is recommended. There is a 1.8 cm nodular density along the wall of the proximal duodenum (coronal series 5, image 33 and axial series 2 image 76). This area of the duodenum is not evaluated due to is non opacification with oral contrast. This may represent a focal area of thickening or a duodenal diverticulum. Other etiologies are not excluded. Endoscopy may provide better evaluation if clinically indicated. There is sigmoid diverticulosis without active  inflammation. Moderate stool noted throughout the colon. No evidence of bowel obstruction. Appendectomy. Advanced aortoiliac atherosclerotic disease. The origins of the celiac axis, SMA, IMA as well as the origins of the renal arteries remain patent. No portal venous gas identified. There is retrograde flow of contrast into the IVC and hepatic veins compatible with a degree of right heart dysfunction. There is no adenopathy in the abdomen pelvis. There is multilevel degenerative changes of the spine. No acute fracture. Grade 1 L2-L3 retrolisthesis. Median sternotomy wires. IMPRESSION: Emphysema.  No focal consolidation or pneumothorax. Severe cardiomegaly with evidence of right cardiac dysfunction. No evidence of bowel obstruction. Apparent mild thickening of the duodenal wall, likely related to underdistention. Duodenitis is not excluded. Clinical correlation is recommended. Focal nodular thickening along the duodenal wall may represent a diverticulum. Other etiologies are not excluded. Endoscopy may provide better evaluation. Electronically Signed   By: Elgie Collard M.D.   On: 01/09/2015 02:04    CBC  Recent Labs Lab 01/07/15 1638 01/07/15 1646 01/08/15 0228 01/09/15 0345 01/10/15 0640 01/11/15 0425  WBC 6.7  --  6.1 5.9 10.8* 11.1*  HGB 13.4 14.6 12.5 12.9 13.6 13.1  HCT 39.5 43.0 37.3 39.0 41.9  40.6  PLT 210  --  187 205 215 208  MCV 96.1  --  96.6 97.5 97.9 98.1  MCH 32.6  --  32.4 32.3 31.8 31.6  MCHC 33.9  --  33.5 33.1 32.5 32.3  RDW 17.0*  --  17.2* 16.8* 17.2* 17.0*  LYMPHSABS 1.2  --  1.4 1.1 0.7  --   MONOABS 0.6  --  0.5 0.4 0.7  --   EOSABS 0.1  --  0.1 0.1 0.0  --   BASOSABS 0.0  --  0.0 0.0 0.0  --     Chemistries   Recent Labs Lab 01/07/15 1638 01/07/15 1646 01/08/15 0228 01/09/15 0345 01/10/15 0640  NA 136 137 134* 136 137  K 3.8 3.8 4.0 3.8 3.7  CL 99* 96* 103 103 103  CO2 28  --  25 27 25   GLUCOSE 119* 114* 100* 97 105*  BUN 11 10 6 6 6   CREATININE  0.83 0.90 0.76 0.83 0.89  CALCIUM 9.3  --  8.6* 8.3* 8.8*  AST 36  --  41 40 33  ALT 22  --  19 21 19   ALKPHOS 76  --  63 63 68  BILITOT 1.1  --  1.1 1.4* 1.9*   ------------------------------------------------------------------------------------------------------------------ estimated creatinine clearance is 55.3 mL/min (by C-G formula based on Cr of 0.89). ------------------------------------------------------------------------------------------------------------------ No results for input(s): HGBA1C in the last 72 hours. ------------------------------------------------------------------------------------------------------------------ No results for input(s): CHOL, HDL, LDLCALC, TRIG, CHOLHDL, LDLDIRECT in the last 72 hours. ------------------------------------------------------------------------------------------------------------------  Recent Labs  01/09/15 0345  TSH 1.081   ------------------------------------------------------------------------------------------------------------------ No results for input(s): VITAMINB12, FOLATE, FERRITIN, TIBC, IRON, RETICCTPCT in the last 72 hours.  Coagulation profile  Recent Labs Lab 01/07/15 1638 01/08/15 0631 01/09/15 0345 01/10/15 0640 01/11/15 0425  INR 1.82* 1.83* 1.70* 1.43 1.63*    No results for input(s): DDIMER in the last 72 hours.  Cardiac Enzymes  Recent Labs Lab 01/08/15 0228 01/08/15 0735 01/08/15 1332  TROPONINI 3.73* 4.43* 4.35*   ------------------------------------------------------------------------------------------------------------------ Invalid input(s): POCBNP  No results for input(s): GLUCAP in the last 72 hours.   Bland Rudzinski M.D. [facility] 01/11/2015, 11:51 AM  Pager: [phone] Between 7am to 7pm - call Pager - [phone]  After 7pm go to [email or web address] - password [email or web address]  Call night coverage person covering after 7pm

## 2015-01-11 NOTE — Progress Notes (Signed)
Patient requesting Xanax for anxiety.  Xanax is ordered twice a day, patient has already had two administrations today.  Per patient on her bottle of Xanax at home she can take one pill up to three times per day.  [facility] paged with this information.

## 2015-01-12 ENCOUNTER — Encounter (HOSPITAL_COMMUNITY): Payer: Self-pay | Admitting: Cardiology

## 2015-01-12 DIAGNOSIS — R931 Abnormal findings on diagnostic imaging of heart and coronary circulation: Secondary | ICD-10-CM | POA: Insufficient documentation

## 2015-01-12 DIAGNOSIS — Q245 Malformation of coronary vessels: Secondary | ICD-10-CM

## 2015-01-12 LAB — PROTIME-INR
INR: 2.27 — AB (ref 0.00–1.49)
PROTHROMBIN TIME: 24.8 s — AB (ref 11.6–15.2)

## 2015-01-12 LAB — CBC
HCT: 38.9 % (ref 36.0–46.0)
Hemoglobin: 12.8 g/dL (ref 12.0–15.0)
MCH: 32.4 pg (ref 26.0–34.0)
MCHC: 32.9 g/dL (ref 30.0–36.0)
MCV: 98.5 fL (ref 78.0–100.0)
PLATELETS: 212 10*3/uL (ref 150–400)
RBC: 3.95 MIL/uL (ref 3.87–5.11)
RDW: 17 % — AB (ref 11.5–15.5)
WBC: 8.6 10*3/uL (ref 4.0–10.5)

## 2015-01-12 LAB — HEPARIN LEVEL (UNFRACTIONATED): Heparin Unfractionated: 0.48 IU/mL (ref 0.30–0.70)

## 2015-01-12 MED ORDER — BISACODYL 10 MG RE SUPP
10.0000 mg | Freq: Once | RECTAL | Status: AC
  Administered 2015-01-12: 10 mg via RECTAL
  Filled 2015-01-12: qty 1

## 2015-01-12 MED ORDER — METOPROLOL SUCCINATE ER 25 MG PO TB24
25.0000 mg | ORAL_TABLET | Freq: Every day | ORAL | Status: DC
Start: 2015-01-12 — End: 2015-01-13
  Administered 2015-01-12: 25 mg via ORAL
  Filled 2015-01-12 (×2): qty 1

## 2015-01-12 MED ORDER — POLYETHYLENE GLYCOL 3350 17 G PO PACK: 17.0000 g | PACK | Freq: Every day | ORAL | Status: DC

## 2015-01-12 MED ORDER — WARFARIN SODIUM 10 MG PO TABS
10.0000 mg | ORAL_TABLET | Freq: Once | ORAL | Status: AC
  Administered 2015-01-12: 10 mg via ORAL
  Filled 2015-01-12: qty 1

## 2015-01-12 MED ORDER — POLYETHYLENE GLYCOL 3350 17 G PO PACK
17.0000 g | PACK | Freq: Once | ORAL | Status: AC
  Administered 2015-01-12: 17 g via ORAL
  Filled 2015-01-12: qty 1

## 2015-01-12 NOTE — Progress Notes (Addendum)
Patient Name: Jasmine West Date of Encounter: 01/12/2015  Primary Cardiologist: Dr. Ave Filter [place] (Jul. 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 The L temple numbness and pain went away. Has some headache after flu shot. No significant leg pain. Doing well now. Anxiety.  CURRENT MEDS . docusate sodium  100 mg Oral BID  . fluticasone  2 spray Each Nare QPM  . furosemide  40 mg Oral Daily  . guaiFENesin  600 mg Oral BID  . levothyroxine  50 mcg Oral QHS  . lidocaine  1 patch Transdermal Q24H  . losartan  50 mg Oral QPM  . metoprolol tartrate  12.5 mg Oral QPM  . montelukast  10 mg Oral q morning - 10a  . nicotine  21 mg Transdermal Daily  . sodium chloride  3 mL Intravenous Q12H  . sucralfate  1 g Oral QID  . Umeclidinium Bromide  1 puff Inhalation QHS  . warfarin  10 mg Oral ONCE-1800  . Warfarin - Pharmacist Dosing Inpatient   Does not apply q1800    OBJECTIVE  Filed Vitals:   01/11/15 2038 01/11/15 2058 01/12/15 0443 01/12/15 0450  BP: 118/76   143/61  Pulse: 50   88  Temp: 98.7 F (37.1 C)   97.7 F (36.5 C)  TempSrc: Oral   Oral  Resp:    20  Height:      Weight:   129 lb (58.514 kg)   SpO2: 100% 100%  98%    Intake/Output Summary (Last 24 hours) at 01/12/15 1002 Last data filed at 01/12/15 0900  Gross per 24 hour  Intake   1456 ml  Output   1900 ml  Net   -444 ml   Filed Weights   01/10/15 0430 01/11/15 0513 01/12/15 0443  Weight: 132 lb 1.6 oz (59.92 kg) 127 lb 11.2  oz (57.924 kg) 129 lb (58.514 kg)    PHYSICAL EXAM  General: Pleasant, NAD. Neuro: Alert and oriented X 3. Moves all extremities spontaneously. Psych: Normal affect. HEENT:  Normal  Neck: Supple without bruits or JVD. Lungs:  Resp regular and unlabored, CTA. Heart: irregular. no s3, s4, or murmurs. Abdomen: Soft, non-tender, non-distended, BS + x 4.  Extremities: No clubbing, cyanosis or edema. DP/PT/Radials 2+ and equal bilaterally.  Accessory Clinical Findings  CBC  Recent Labs  01/10/15 0640 01/11/15 0425 01/12/15 0425  WBC 10.8* 11.1* 8.6  NEUTROABS 9.4*  --   --   HGB 13.6 13.1 12.8  HCT 41.9 40.6 38.9  MCV 97.9 98.1 98.5  PLT 215 208 212   Basic Metabolic Panel  Recent Labs  01/10/15 0640  NA 137  K 3.7  CL 103  CO2 25  GLUCOSE 105*  BUN 6  CREATININE 0.89  CALCIUM 8.8*   Liver Function Tests  Recent Labs  01/10/15 0640  AST 33  ALT 19  ALKPHOS 68  BILITOT 1.9*  PROT 6.7  ALBUMIN 3.9    TELE afib with HR 80-90s    ECG  No new EKG  Echocardiogram 01/08/2015  LV EF: 40% -  45%  ------------------------------------------------------------------- Indications:   TIA 435.9.  ------------------------------------------------------------------- History:  PMH:  Chronic obstructive pulmonary disease. PMH: Brain Aneurysm. Risk factors: Hypertension. Dyslipidemia.  ------------------------------------------------------------------- Study Conclusions  - Left ventricle: Diffuse hypokinesis worse in the septum and apex. The cavity size was mildly dilated. Wall thickness was normal. Systolic function was mildly to moderately reduced. The estimated ejection fraction was in the range of 40% to 45%. - Aortic valve: Mechanical AVR with no significant perivalvular regurgitation Systolic gradients mildly elevated. Valve area (VTI): 1.16 cm^2. Valve area (Vmax): 1.14 cm^2. Valve area (Vmean): 1.32 cm^2. - Mitral valve:  Mechanical MV with normal diastolic gradients and no significant peri valvular regurgitations. Valve area by continuity equation (using LVOT flow): 2 cm^2. - Left atrium: The atrium was severely dilated. - Right atrium: The atrium was moderately dilated. - Atrial septum: No defect or patent foramen ovale was identified. - Tricuspid valve: There was moderate-severe regurgitation. - Pulmonary arteries: PA peak pressure: 58 mm Hg (S).    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  RECOMMENDATION: Ms. Sandiford is status post mechanical AVR and MVR in 1995 at [facility]. With her positive troponins, cardiac CT angiography should be performed to make certain there is not significant impingement of her anomalous coronary circulation between the PDA and aorta       Radiology/Studies  Ct Head Wo Contrast  01/07/2015  CLINICAL DATA:  Headache with left leg weakness today. On anticoagulation. History of brain aneurysm. Code stroke. Initial encounter. EXAM: CT HEAD WITHOUT CONTRAST TECHNIQUE: Contiguous axial images were obtained from the base of the skull through the vertex without intravenous contrast. COMPARISON:  Head CT 11/11/2013. FINDINGS: There is no evidence of acute intracranial hemorrhage, mass lesion, brain edema or extra-axial fluid collection. The ventricles and subarachnoid spaces are appropriately sized for age. There is no CT evidence of acute cortical infarction. There is chronic subcortical encephalomalacia in the posterior right frontal lobe. 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 There is also an old lacunar infarct anteriorly in the left caudate body. Intracranial vascular calcifications are noted. The visualized paranasal sinuses, mastoid air cells and middle ears are clear. The calvarium is intact. TMJ  degenerative changes are present bilaterally. IMPRESSION: Stable head CT demonstrating chronic small vessel ischemic changes, but no CT evidence of acute stroke or acute hemorrhage. These results were called by telephone at the time of interpretation on 01/07/2015 at 5:02 pm to Dr. Samuel Jester , who verbally acknowledged these results. Electronically Signed   By: Carey Bullocks M.D.   On: 01/07/2015 17:04   Ct Chest W Contrast  01/09/2015  CLINICAL DATA:  69 year old female with dysphagia. EXAM: CT CHEST, ABDOMEN, AND PELVIS WITH CONTRAST TECHNIQUE: Multidetector CT imaging of the chest, abdomen and pelvis was performed following the standard protocol during bolus administration of intravenous contrast. CONTRAST:  80mL OMNIPAQUE IOHEXOL 300 MG/ML  SOLN COMPARISON:  None. FINDINGS: There is emphysematous changes of the lungs. No focal consolidation, pleural effusion, or pneumothorax. The central airways are patent. There is atherosclerotic calcification of the thoracic aorta. No aneurysm or dissection. The central pulmonary arteries appear patent. There is severe cardiomegaly. Mechanical mitral and aortic valves. No pericardial effusion. A slightly enlarged left hilar lymph node measuring 1.5 cm in short axis. Mildly prominent subcarinal  lymph nodes noted. The visualized esophagus is grossly unremarkable. The thyroid gland is not well visualized. There is no axillary adenopathy. The chest wall soft tissues appear unremarkable. No intra-abdominal free air or free fluid. There is minimal irregularity of the hepatic contour. The gallbladder is not visualized, likely surgically absent. Mild periportal edema. The pancreas, spleen, and adrenal glands appear unremarkable. Bilateral renal cortical irregularity, likely related to recurrent infarct and scarring. A 1.7 cm right renal cyst. Subcentimeter left renal hypodense lesion is too small to characterize. There is no hydronephrosis on either side. The visualized  ureters and urinary bladder appear unremarkable. Small uterine calcification may represent calcified fibroids. Ultrasound may provide better evaluation of the pelvic structures. There is mild apparent thickening of the proximal duodenum, likely related to underdistention. Duodenitis is less likely. Clinical correlation is recommended. There is a 1.8 cm nodular density along the wall of the proximal duodenum (coronal series 5, image 33 and axial series 2 image 76). This area of the duodenum is not evaluated due to is non opacification with oral contrast. This may represent a focal area of thickening or a duodenal diverticulum. Other etiologies are not excluded. Endoscopy may provide better evaluation if clinically indicated. There is sigmoid diverticulosis without active inflammation. Moderate stool noted throughout the colon. No evidence of bowel obstruction. Appendectomy. Advanced aortoiliac atherosclerotic disease. The origins of the celiac axis, SMA, IMA as well as the origins of the renal arteries remain patent. No portal venous gas identified. There is retrograde flow of contrast into the IVC and hepatic veins compatible with a degree of right heart dysfunction. There is no adenopathy in the abdomen pelvis. There is multilevel degenerative changes of the spine. No acute fracture. Grade 1 L2-L3 retrolisthesis. Median sternotomy wires. IMPRESSION: Emphysema.  No focal consolidation or pneumothorax. Severe cardiomegaly with evidence of right cardiac dysfunction. No evidence of bowel obstruction. Apparent mild thickening of the duodenal wall, likely related to underdistention. Duodenitis is not excluded. Clinical correlation is recommended. Focal nodular thickening along the duodenal wall may represent a diverticulum. Other etiologies are not excluded. Endoscopy may provide better evaluation. Electronically Signed   By: Elgie Collard M.D.   On: 01/09/2015 02:04   Ct Angio Low Extrem Left W/cm &/or  Wo/cm  01/07/2015  CLINICAL DATA:  Possible ischemic left foot. EXAM: CT ANGIOGRAPHY LOWER LEFT EXTREMITY TECHNIQUE: Arterially timed CT angiography of the left lower extremity was performed from the level of the upper popliteal artery through the toes. This protocol was specifically requested. CONTRAST:  OMNIPAQUE IOHEXOL 350 MG/ML SOLN COMPARISON:  None. FINDINGS: Diffuse atheromatous wall thickening and calcification of the popliteal artery without flow limiting stenosis. Heavily diseased anterior tibial artery with underfilling and occlusion above the ankle. Chronic appearing occlusion or near occlusion of the tibioperoneal trunk with reconstituted flow to the under-filled and diffusely irregular posterior tibial artery from muscular branch, with continuation to the ankle. There is thready reconstitution of the peroneal artery, which likely accounts for the faintly opacified dorsalis pedis, reportedly detected by Doppler. No acute appearing arterial occlusion identified. There is secondary findings of inflow disease on the left: the study timing was triggered based on the left popliteal artery and there is asymmetric venous opacification of the right lower extremity (partially seen below the knee on reformats). No soft tissue gas or evidence of osteomyelitis. These results were called by telephone at the time of interpretation on 01/07/2015 at 9:20 pm to Dr. Gerhard Munch , who verbally acknowledged these results. Review  of the MIP images confirms the above findings. IMPRESSION: 1. Extensive popliteal and infrapopliteal atherosclerosis with 2 vessel runoff at the ankle. 2. Secondary findings of left lower extremity arterial inflow disease, as above. Electronically Signed   By: Marnee SpringJonathon  Watts M.D.   On: 01/07/2015 21:34   Mr Maxine GlennMra Head Wo Contrast  01/08/2015  CLINICAL DATA:  History of stroke. EXAM: MRA HEAD WITHOUT CONTRAST TECHNIQUE: Angiographic images of the Circle of Willis were obtained using  MRA technique without intravenous contrast. COMPARISON:  CT head 01/07/2015 FINDINGS: Internal carotid artery is widely patent through the skullbase. There is diffuse atherosclerotic narrowing of the cavernous carotid bilaterally causing moderate to severe diffuse stenosis in both cavernous carotid, right greater than left. Supraclinoid internal carotid artery returns to normal caliber. Anterior and middle cerebral arteries normal in caliber without significant stenosis. Both vertebral arteries patent to the basilar. Left PICA widely patent. Right PICA not visualized. Basilar widely patent. Right AICA patent. Superior cerebellar and posterior cerebral arteries patent without stenosis. 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 Otherwise no significant intracranial stenosis. Electronically Signed   By: Marlan Palauharles  Clark M.D.   On: 01/08/2015 09:52   Ct Abdomen Pelvis W Contrast  01/09/2015  CLINICAL DATA:  69 year old female with dysphagia. EXAM: CT CHEST, ABDOMEN, AND PELVIS WITH CONTRAST TECHNIQUE: Multidetector CT imaging of the chest, abdomen and pelvis was performed following the standard protocol during bolus administration of intravenous contrast. CONTRAST:  80mL OMNIPAQUE IOHEXOL 300 MG/ML  SOLN COMPARISON:  None. FINDINGS: There is emphysematous changes of the lungs. No focal consolidation, pleural effusion, or pneumothorax. The central airways are patent. There is atherosclerotic calcification of the thoracic aorta. No aneurysm or dissection. The central pulmonary arteries appear patent. There is severe cardiomegaly. Mechanical mitral and aortic valves. No pericardial effusion. A slightly enlarged left hilar lymph node measuring 1.5 cm in short axis. Mildly prominent subcarinal lymph nodes noted. The visualized esophagus is grossly unremarkable. The thyroid gland is not well visualized. There  is no axillary adenopathy. The chest wall soft tissues appear unremarkable. No intra-abdominal free air or free fluid. There is minimal irregularity of the hepatic contour. The gallbladder is not visualized, likely surgically absent. Mild periportal edema. The pancreas, spleen, and adrenal glands appear unremarkable. Bilateral renal cortical irregularity, likely related to recurrent infarct and scarring. A 1.7 cm right renal cyst. Subcentimeter left renal hypodense lesion is too small to characterize. There is no hydronephrosis on either side. The visualized ureters and urinary bladder appear unremarkable. Small uterine calcification may represent calcified fibroids. Ultrasound may provide better evaluation of the pelvic structures. There is mild apparent thickening of the proximal duodenum, likely related to underdistention. Duodenitis is less likely. Clinical correlation is recommended. There is a 1.8 cm nodular density along the wall of the proximal duodenum (coronal series 5, image 33 and axial series 2 image 76). This area of the duodenum is not evaluated due to is non opacification with oral contrast. This may represent a focal area of thickening or a duodenal diverticulum. Other etiologies are not excluded. Endoscopy may provide better evaluation if clinically indicated. There is sigmoid diverticulosis without active inflammation. Moderate stool noted throughout the colon. No evidence of bowel obstruction. Appendectomy. Advanced aortoiliac atherosclerotic disease. The origins of the celiac axis, SMA, IMA as well as the origins of the renal arteries remain patent. No portal venous gas identified. There is retrograde flow of contrast into  the IVC and hepatic veins compatible with a degree of right heart dysfunction. There is no adenopathy in the abdomen pelvis. There is multilevel degenerative changes of the spine. No acute fracture. Grade 1 L2-L3 retrolisthesis. Median sternotomy wires. IMPRESSION: Emphysema.   No focal consolidation or pneumothorax. Severe cardiomegaly with evidence of right cardiac dysfunction. No evidence of bowel obstruction. Apparent mild thickening of the duodenal wall, likely related to underdistention. Duodenitis is not excluded. Clinical correlation is recommended. Focal nodular thickening along the duodenal wall may represent a diverticulum. Other etiologies are not excluded. Endoscopy may provide better evaluation. Electronically Signed   By: Elgie Collard M.D.   On: 01/09/2015 02:04    ASSESSMENT AND PLAN  1. 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 CT scan of chest demonstrates coronary anatomy and left system (LAD) traverses lateral to PA (not dangerous). No need to obtain outpatient CT of coronaries  2. Neck pain: carotid U/S 1-39% bilaterally. Resolved  3. PAD with extensive popliteal and infrapopliteal atherosclerosis with 2v runoff, no acute arterial occlusion  - underwent LE angiography initially due to leg pain, subtherapeutic INR  - ABI reassuring, Dr. Edilia Bo consult reviewed.   4. Permanent afib on on chronic coumadin  - digoxin stopped due to transient bradycardia  - Changed metoprolol from 12.5 at night to 25mg  PO QD.   5. H/o valvular dx s/p both mechanical mitral and aortic valve replacement in 1995 at [facility]  - bridge coumadin with IV heparin until therapeutic, expect discharge once therapeutic. Likely DC tomorrow once 2.5.  6. Prior CVA 7. H/o brain aneurysm s/p 4 brain stents 8. HTN 9. HLD 10. Tobacco abuse: ready to quit per patient    Signed,  Donato Schultz, MD

## 2015-01-12 NOTE — Progress Notes (Signed)
CARDIAC REHAB PHASE I   PRE:  Rate/Rhythm: 100 afib  BP:  Supine:   Sitting: 119/45  Standing:    SaO2: 96%RA  MODE:  Ambulation: 550 ft   POST:  Rate/Rhythm: 122 afib  BP:  Supine:   Sitting: 141/53  Standing:    SaO2: 97%RA 1040-1140 Wrote order for Phase 1 since pt had NSTEMI. Pt walked 550 ft with hand held asst. No CP. MI education completed with pt and husband who voiced understanding. Discussed smoking cessation and gave fake cigarette and smoking cessation handout. Pt has nicotine patch on now. Not sure if she will be able to quit. Discussed CRP 2 and [facility]. Gave heart healthy diet for review. Encouraged pt to start a walking program and gave instruction as husband stated he felt she is a little depressed. Discussed how ex might help with her feel a little better. Tolerated walk well.   Luetta Nuttingharlene Traveion Ruddock, RN BSN  01/12/2015 11:34 AM

## 2015-01-12 NOTE — Plan of Care (Signed)
Problem: Activity: Goal: Risk for activity intolerance will decrease Outcome: Progressing Patient ambulating in room without dyspnea.  Per patient walked around the unit on day shift.

## 2015-01-12 NOTE — Plan of Care (Signed)
Problem: Fluid Volume: Goal: Ability to maintain a balanced intake and output will improve Outcome: Completed/Met Date Met:  01/12/15 No signs or symptoms of fluid overload/retention noted.  Patient tolerating PO intake and is having adequate PO intake and adequate urinary output.

## 2015-01-12 NOTE — Progress Notes (Signed)
[facility]                                                                              Patient Demographics  Jasmine West a 69 y.o. female, DOB - 1945-05-23, MWN:[id number]  Admit date - 01/07/2015   Admitting Physician Hollice Espy, MD  Outpatient Primary MD for the patient is Elise Benne, MD  LOS - 5   Chief Complaint  Patient presents with  . Leg Pain       Brief HPI   Patient is a 69 year old female past oral history of atrial fibrillation, brain aneurysm status post brain stent 4, COPD with ongoing tobacco abuse and status post mechanical aortic and mitral valve replacement initially presented to [facility] on 12/18 with complaints of left-sided neck, upper extremity and lower extremity on sensation plus left foot pain who was concerned she may have had an embolic stroke even that her foot felt cool and so she came for further evaluation. Patient transferred down to [facility] for vascular surgery evaluation who after checking CT angiogram noted infra inguinal arterial occlusive disease but no evidence of acute arterial occlusion. Patient was evaluated by neurology who recommended MRI. Her troponin was also noted to be mildly elevated at 0.86 hospitalist will call for further evaluation and admission. Patient's chronic Coumadin was subtherapeutic on admission  Following admission, MRI of the brain unremarkable. Given presenting symptoms of left facial and neck pain, asked her surgery followed up ordering a carotid duplex scan. However, her repeat troponin jumped to 3.7 and another after that was 4.2. Cardiology consulted who evaluated patient and recommended cardiac catheterization. EKG noted some ST depressions. Her left sided symptoms have since resolved following admission. 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 CT scan of chest done earlier demonstrated coronary anatomy and left system traversing lateral to pulmonary artery which is not dangerous. It was felt that given no obstructive CAD, no need to obtain outpatient CT of coronaries. After follow-up by vascular surgery, there is no evidence of acute arterial occlusion with ABIs providing reassurance.   Assessment & Plan   Principal Problem: Non-STEMI with mechanical valves.  - Echo 12/19 showed EF of 40-45% with mechanical AVR, without significant perivalvular regurg, mechanical MR, severely dilated LA  - Cardiology was consulted. 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 This was explained to the patient and family by cardiology. - Anticoagulation has been resumed, plan to DC home when INR therapeutic. Patient unable to afford Lovenox, so will continue on heparin bridge until then. INR trending up, 2.27 today, likely DC home in a.m.  Active Problems:  Hypothyroidism: Continue Synthroid   COPD (chronic obstructive pulmonary disease) (HCC): Stable.  - Continue prn nebulizers. Breathing comfortably on room air   Tobacco use - Continue nicotine patch, counseled strongly on smoking  cessation   Hypertension - Currently stable  H/o valvular dx s/p both mechanical mitral and aortic valve replacement in 1995 at [facility] - Continue to bridge Coumadin with IV heparin until therapeutic INR >2.5   Atrial fibrillation (HCC): Persistent. INR subtherapeutic.  - Chads 2 VASC score of 6. Cont coumadin+ heparin bridge until INR >2.5 - Digoxin discontinued, per cardiology if persistent heart rate about 100, may increase metoprolol to 25mg  BID  Chronic systolic heart  failure: -  Noted by cardiac cath. Euvolemic, on oral Lasix  Neck pain: carotid U/S 1-39% bilaterally.  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  Consults   Cardiology Vascular surgery  DVT Prophylaxis heparin +coumadin  Medications  Scheduled Meds: . docusate sodium  100 mg Oral BID  . fluticasone  2 spray Each Nare QPM  . furosemide  40 mg Oral Daily  . guaiFENesin  600 mg Oral BID  . levothyroxine  50 mcg Oral QHS  . lidocaine  1 patch Transdermal Q24H  . losartan  50 mg Oral QPM  . metoprolol succinate  25 mg Oral Daily  . montelukast  10 mg Oral q morning - 10a  . nicotine  21 mg Transdermal Daily  . sodium chloride  3 mL Intravenous Q12H  . sucralfate  1 g Oral QID  . Umeclidinium Bromide  1 puff Inhalation QHS  . warfarin  10 mg Oral ONCE-1800  . Warfarin - Pharmacist Dosing Inpatient   Does not apply q1800   Continuous Infusions: . heparin 1,300 Units/hr (01/11/15 2122)   PRN Meds:.sodium chloride, acetaminophen, ALPRAZolam, guaiFENesin-dextromethorphan, iohexol, ipratropium-albuterol, magnesium hydroxide, morphine injection, ondansetron **OR** ondansetron (ZOFRAN) IV, oxyCODONE-acetaminophen, oxymetazoline, promethazine, sodium chloride, traMADol   Antibiotics   Anti-infectives    None        Subjective:   Jasmine West was seen and examined today. No complaints this morning, except headache. No chest pain or shortness of breath.  Patient denies dizziness, abdominal pain,  N/V/D/C, new weakness, numbess, tingling. No acute events overnight.    Objective:   Blood pressure 107/51, pulse 79, temperature 97.7 F (36.5 C), temperature source Oral, resp. rate 20, height 5\' 7"  (1.702 m), weight 58.514 kg (129 lb), SpO2 98 %.  Wt Readings from Last 3 Encounters:  01/12/15 58.514 kg (129 lb)  06/07/14 63.504 kg (140 lb)  05/13/14 63.504 kg (140 lb)     Intake/Output Summary (Last 24 hours) at 01/12/15 1212 Last data filed at 01/12/15 1011  Gross per 24 hour  Intake   1459 ml  Output   1900 ml  Net   -441 ml    Exam  General: Alert and oriented x 3, NAD  HEENT:  PERRLA, EOMI  Neck: Supple, no JVD, no masses  CVS: S1 S2 auscultated, ireg ireg, click +  Respiratory: CTAB  Abdomen: Soft, nontender, nondistended, + bowel sounds  Ext: no cyanosis clubbing or edema  Neuro: no new deficits  Skin: No rashes  Psych: Normal affect and demeanor, alert and oriented x3    Data Review  Micro Results Recent Results (from the past 240 hour(s))  MRSA PCR Screening     Status: None   Collection Time: 01/08/15 11:52 AM  Result Value Ref Range Status   MRSA by PCR NEGATIVE NEGATIVE Final    Comment:        The GeneXpert MRSA Assay (FDA approved for NASAL specimens only), is one component of a comprehensive MRSA colonization surveillance program. It is not intended to diagnose MRSA infection nor to guide or monitor treatment for MRSA infections.     Radiology Reports Ct Head Wo Contrast  01/07/2015  CLINICAL DATA:  Headache with left leg weakness today. On anticoagulation. History of brain aneurysm. Code stroke. Initial encounter. EXAM: CT HEAD WITHOUT CONTRAST TECHNIQUE: Contiguous axial images were obtained from the base of the skull through the vertex without intravenous contrast. COMPARISON:  Head CT 11/11/2013. FINDINGS: There is no evidence of acute intracranial hemorrhage, mass lesion, brain edema or extra-axial fluid collection. The  ventricles and subarachnoid spaces are appropriately sized for age. There is no CT evidence of acute cortical infarction. There is chronic subcortical encephalomalacia in the posterior right frontal lobe. 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 There is also an old lacunar infarct anteriorly in the left caudate body. Intracranial vascular calcifications are noted. The visualized paranasal sinuses, mastoid air cells and middle ears are clear. The calvarium is intact. TMJ degenerative changes are present bilaterally. IMPRESSION: Stable head CT demonstrating chronic small vessel ischemic changes, but no CT evidence of acute stroke or acute hemorrhage. These results were called by telephone at the time of interpretation on 01/07/2015 at 5:02 pm to Dr. Samuel JesterKATHLEEN MCMANUS , who verbally acknowledged these results. Electronically Signed   By: Carey BullocksWilliam  Veazey M.D.   On: 01/07/2015 17:04   Ct Chest W Contrast  01/09/2015  CLINICAL DATA:  69 year old female with dysphagia. EXAM: CT CHEST, ABDOMEN, AND PELVIS WITH CONTRAST TECHNIQUE: Multidetector CT imaging of the chest, abdomen and pelvis was performed following the standard protocol during bolus administration of intravenous contrast. CONTRAST:  80mL OMNIPAQUE IOHEXOL 300 MG/ML  SOLN COMPARISON:  None. FINDINGS: There is emphysematous changes of the lungs. No focal consolidation, pleural effusion, or pneumothorax. The central airways are patent. There is atherosclerotic calcification of the thoracic aorta. No aneurysm or dissection. The central pulmonary arteries appear patent. There is severe cardiomegaly. Mechanical mitral and aortic valves. No pericardial effusion. A slightly enlarged left hilar lymph node measuring 1.5 cm in short axis. Mildly prominent subcarinal lymph nodes noted. The visualized esophagus is grossly unremarkable. The thyroid gland is not well visualized. There is no axillary adenopathy. The chest wall soft tissues appear unremarkable. No  intra-abdominal free air or free fluid. There is minimal irregularity of the hepatic contour. The gallbladder is not visualized, likely surgically absent. Mild periportal edema. The pancreas, spleen, and adrenal glands appear unremarkable. Bilateral renal cortical irregularity, likely related to recurrent infarct and scarring. A 1.7 cm right renal cyst. Subcentimeter left renal hypodense lesion is too small to characterize. There is no hydronephrosis on either side. The visualized ureters and urinary bladder appear unremarkable. Small uterine calcification may represent calcified fibroids. Ultrasound may provide better evaluation of the pelvic structures. There is mild apparent thickening of the proximal duodenum, likely related to underdistention. Duodenitis is less likely. Clinical correlation is recommended. There is a 1.8 cm nodular density along the wall of the proximal duodenum (coronal series 5, image 33 and axial series 2 image 76). This area of the duodenum  is not evaluated due to is non opacification with oral contrast. This may represent a focal area of thickening or a duodenal diverticulum. Other etiologies are not excluded. Endoscopy may provide better evaluation if clinically indicated. There is sigmoid diverticulosis without active inflammation. Moderate stool noted throughout the colon. No evidence of bowel obstruction. Appendectomy. Advanced aortoiliac atherosclerotic disease. The origins of the celiac axis, SMA, IMA as well as the origins of the renal arteries remain patent. No portal venous gas identified. There is retrograde flow of contrast into the IVC and hepatic veins compatible with a degree of right heart dysfunction. There is no adenopathy in the abdomen pelvis. There is multilevel degenerative changes of the spine. No acute fracture. Grade 1 L2-L3 retrolisthesis. Median sternotomy wires. IMPRESSION: Emphysema.  No focal consolidation or pneumothorax. Severe cardiomegaly with evidence of  right cardiac dysfunction. No evidence of bowel obstruction. Apparent mild thickening of the duodenal wall, likely related to underdistention. Duodenitis is not excluded. Clinical correlation is recommended. Focal nodular thickening along the duodenal wall may represent a diverticulum. Other etiologies are not excluded. Endoscopy may provide better evaluation. Electronically Signed   By: Elgie Collard M.D.   On: 01/09/2015 02:04   Ct Angio Low Extrem Left W/cm &/or Wo/cm  01/07/2015  CLINICAL DATA:  Possible ischemic left foot. EXAM: CT ANGIOGRAPHY LOWER LEFT EXTREMITY TECHNIQUE: Arterially timed CT angiography of the left lower extremity was performed from the level of the upper popliteal artery through the toes. This protocol was specifically requested. CONTRAST:  OMNIPAQUE IOHEXOL 350 MG/ML SOLN COMPARISON:  None. FINDINGS: Diffuse atheromatous wall thickening and calcification of the popliteal artery without flow limiting stenosis. Heavily diseased anterior tibial artery with underfilling and occlusion above the ankle. Chronic appearing occlusion or near occlusion of the tibioperoneal trunk with reconstituted flow to the under-filled and diffusely irregular posterior tibial artery from muscular branch, with continuation to the ankle. There is thready reconstitution of the peroneal artery, which likely accounts for the faintly opacified dorsalis pedis, reportedly detected by Doppler. No acute appearing arterial occlusion identified. There is secondary findings of inflow disease on the left: the study timing was triggered based on the left popliteal artery and there is asymmetric venous opacification of the right lower extremity (partially seen below the knee on reformats). No soft tissue gas or evidence of osteomyelitis. These results were called by telephone at the time of interpretation on 01/07/2015 at 9:20 pm to Dr. Gerhard Munch , who verbally acknowledged these results. Review of the MIP  images confirms the above findings. IMPRESSION: 1. Extensive popliteal and infrapopliteal atherosclerosis with 2 vessel runoff at the ankle. 2. Secondary findings of left lower extremity arterial inflow disease, as above. Electronically Signed   By: Marnee Spring M.D.   On: 01/07/2015 21:34   Mr Maxine Glenn Head Wo Contrast  01/08/2015  CLINICAL DATA:  History of stroke. EXAM: MRA HEAD WITHOUT CONTRAST TECHNIQUE: Angiographic images of the Circle of Willis were obtained using MRA technique without intravenous contrast. COMPARISON:  CT head 01/07/2015 FINDINGS: Internal carotid artery is widely patent through the skullbase. There is diffuse atherosclerotic narrowing of the cavernous carotid bilaterally causing moderate to severe diffuse stenosis in both cavernous carotid, right greater than left. Supraclinoid internal carotid artery returns to normal caliber. Anterior and middle cerebral arteries normal in caliber without significant stenosis. Both vertebral arteries patent to the basilar. Left PICA widely patent. Right PICA not visualized. Basilar widely patent. Right AICA patent. Superior cerebellar and posterior cerebral arteries patent  without stenosis. 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 Otherwise no significant intracranial stenosis. Electronically Signed   By: Marlan Palau M.D.   On: 01/08/2015 09:52   Ct Abdomen Pelvis W Contrast  01/09/2015  CLINICAL DATA:  69 year old female with dysphagia. EXAM: CT CHEST, ABDOMEN, AND PELVIS WITH CONTRAST TECHNIQUE: Multidetector CT imaging of the chest, abdomen and pelvis was performed following the standard protocol during bolus administration of intravenous contrast. CONTRAST:  80mL OMNIPAQUE IOHEXOL 300 MG/ML  SOLN COMPARISON:  None. FINDINGS: There is emphysematous changes of the lungs. No focal consolidation, pleural effusion, or pneumothorax.  The central airways are patent. There is atherosclerotic calcification of the thoracic aorta. No aneurysm or dissection. The central pulmonary arteries appear patent. There is severe cardiomegaly. Mechanical mitral and aortic valves. No pericardial effusion. A slightly enlarged left hilar lymph node measuring 1.5 cm in short axis. Mildly prominent subcarinal lymph nodes noted. The visualized esophagus is grossly unremarkable. The thyroid gland is not well visualized. There is no axillary adenopathy. The chest wall soft tissues appear unremarkable. No intra-abdominal free air or free fluid. There is minimal irregularity of the hepatic contour. The gallbladder is not visualized, likely surgically absent. Mild periportal edema. The pancreas, spleen, and adrenal glands appear unremarkable. Bilateral renal cortical irregularity, likely related to recurrent infarct and scarring. A 1.7 cm right renal cyst. Subcentimeter left renal hypodense lesion is too small to characterize. There is no hydronephrosis on either side. The visualized ureters and urinary bladder appear unremarkable. Small uterine calcification may represent calcified fibroids. Ultrasound may provide better evaluation of the pelvic structures. There is mild apparent thickening of the proximal duodenum, likely related to underdistention. Duodenitis is less likely. Clinical correlation is recommended. There is a 1.8 cm nodular density along the wall of the proximal duodenum (coronal series 5, image 33 and axial series 2 image 76). This area of the duodenum is not evaluated due to is non opacification with oral contrast. This may represent a focal area of thickening or a duodenal diverticulum. Other etiologies are not excluded. Endoscopy may provide better evaluation if clinically indicated. There is sigmoid diverticulosis without active inflammation. Moderate stool noted throughout the colon. No evidence of bowel obstruction. Appendectomy. Advanced aortoiliac  atherosclerotic disease. The origins of the celiac axis, SMA, IMA as well as the origins of the renal arteries remain patent. No portal venous gas identified. There is retrograde flow of contrast into the IVC and hepatic veins compatible with a degree of right heart dysfunction. There is no adenopathy in the abdomen pelvis. There is multilevel degenerative changes of the spine. No acute fracture. Grade 1 L2-L3 retrolisthesis. Median sternotomy wires. IMPRESSION: Emphysema.  No focal consolidation or pneumothorax. Severe cardiomegaly with evidence of right cardiac dysfunction. No evidence of bowel obstruction. Apparent mild thickening of the duodenal wall, likely related to underdistention. Duodenitis is not excluded. Clinical correlation is recommended. Focal nodular thickening along the duodenal wall may represent a diverticulum. Other etiologies are not excluded. Endoscopy may provide better evaluation. Electronically Signed   By: Elgie Collard M.D.   On: 01/09/2015 02:04    CBC  Recent Labs Lab 01/07/15 1638  01/08/15 0228 01/09/15 0345 01/10/15 0640 01/11/15 0425 01/12/15 0425  WBC 6.7  --  6.1 5.9 10.8* 11.1* 8.6  HGB 13.4  < > 12.5 12.9 13.6 13.1 12.8  HCT 39.5  < > 37.3 39.0 41.9 40.6 38.9  PLT 210  --  187 205 215 208 212  MCV 96.1  --  96.6 97.5 97.9 98.1 98.5  MCH 32.6  --  32.4 32.3 31.8 31.6 32.4  MCHC 33.9  --  33.5 33.1 32.5 32.3 32.9  RDW 17.0*  --  17.2* 16.8* 17.2* 17.0* 17.0*  LYMPHSABS 1.2  --  1.4 1.1 0.7  --   --   MONOABS 0.6  --  0.5 0.4 0.7  --   --   EOSABS 0.1  --  0.1 0.1 0.0  --   --   BASOSABS 0.0  --  0.0 0.0 0.0  --   --   < > = values in this interval not displayed.  Chemistries   Recent Labs Lab 01/07/15 1638 01/07/15 1646 01/08/15 0228 01/09/15 0345 01/10/15 0640  NA 136 137 134* 136 137  K 3.8 3.8 4.0 3.8 3.7  CL 99* 96* 103 103 103  CO2 28  --  25 27 25   GLUCOSE 119* 114* 100* 97 105*  BUN 11 10 6 6 6   CREATININE 0.83 0.90 0.76 0.83  0.89  CALCIUM 9.3  --  8.6* 8.3* 8.8*  AST 36  --  41 40 33  ALT 22  --  19 21 19   ALKPHOS 76  --  63 63 68  BILITOT 1.1  --  1.1 1.4* 1.9*   ------------------------------------------------------------------------------------------------------------------ estimated creatinine clearance is 55.9 mL/min (by C-G formula based on Cr of 0.89). ------------------------------------------------------------------------------------------------------------------ No results for input(s): HGBA1C in the last 72 hours. ------------------------------------------------------------------------------------------------------------------ No results for input(s): CHOL, HDL, LDLCALC, TRIG, CHOLHDL, LDLDIRECT in the last 72 hours. ------------------------------------------------------------------------------------------------------------------ No results for input(s): TSH, T4TOTAL, T3FREE, THYROIDAB in the last 72 hours.  Invalid input(s): FREET3 ------------------------------------------------------------------------------------------------------------------ No results for input(s): VITAMINB12, FOLATE, FERRITIN, TIBC, IRON, RETICCTPCT in the last 72 hours.  Coagulation profile  Recent Labs Lab 01/08/15 0631 01/09/15 0345 01/10/15 0640 01/11/15 0425 01/12/15 0425  INR 1.83* 1.70* 1.43 1.63* 2.27*    No results for input(s): DDIMER in the last 72 hours.  Cardiac Enzymes  Recent Labs Lab 01/08/15 0228 01/08/15 0735 01/08/15 1332  TROPONINI 3.73* 4.43* 4.35*   ------------------------------------------------------------------------------------------------------------------ Invalid input(s): POCBNP  No results for input(s): GLUCAP in the last 72 hours.   Jasmine West M.D. [facility] 01/12/2015, 12:12 PM  Pager: [phone] Between 7am to 7pm - call Pager - [phone]  After 7pm go to [email or web address] - password [email or web address]  Call night coverage person covering after 7pm

## 2015-01-12 NOTE — Discharge Instructions (Signed)
Information on my medicine - Coumadin   (Warfarin)  This medication education was reviewed with me or my healthcare representative as part of my discharge preparation.  The pharmacist that spoke with me during my hospital stay was:  Pat Patrick, [facility]  Why was Coumadin prescribed for you? Coumadin was prescribed for you because you have a blood clot or a medical condition that can cause an increased risk of forming blood clots. Blood clots can cause serious health problems by blocking the flow of blood to the heart, lung, or brain. Coumadin can prevent harmful blood clots from forming. As a reminder your indication for Coumadin is:   Blood Clot Prevention After Heart Valve Surgery  What test will check on my response to Coumadin? While on Coumadin (warfarin) you will need to have an INR test regularly to ensure that your dose is keeping you in the desired range. The INR (international normalized ratio) number is calculated from the result of the laboratory test called prothrombin time (PT).  If an INR APPOINTMENT HAS NOT ALREADY BEEN MADE FOR YOU please schedule an appointment to have this lab work done by your health care provider within 7 days. Your INR goal is usually a number between:  2 to 3 or your provider may give you a more narrow range like 2-2.5.  Ask your health care provider during an office visit what your goal INR is.  What  do you need to  know  About  COUMADIN? Take Coumadin (warfarin) exactly as prescribed by your healthcare provider about the same time each day.  DO NOT stop taking without talking to the doctor who prescribed the medication.  Stopping without other blood clot prevention medication to take the place of Coumadin may increase your risk of developing a new clot or stroke.  Get refills before you run out.  What do you do if you miss a dose? If you miss a dose, take it as soon as you remember on the same day then continue your regularly scheduled regimen the next  day.  Do not take two doses of Coumadin at the same time.  Important Safety Information A possible side effect of Coumadin (Warfarin) is an increased risk of bleeding. You should call your healthcare provider right away if you experience any of the following: ? Bleeding from an injury or your nose that does not stop. ? Unusual colored urine (red or dark brown) or unusual colored stools (red or black). ? Unusual bruising for unknown reasons. ? A serious fall or if you hit your head (even if there is no bleeding).  Some foods or medicines interact with Coumadin (warfarin) and might alter your response to warfarin. To help avoid this: ? Eat a balanced diet, maintaining a consistent amount of Vitamin K. ? Notify your provider about major diet changes you plan to make. ? Avoid alcohol or limit your intake to 1 drink for women and 2 drinks for men per day. (1 drink is 5 oz. wine, 12 oz. beer, or 1.5 oz. liquor.)  Make sure that ANY health care provider who prescribes medication for you knows that you are taking Coumadin (warfarin).  Also make sure the healthcare provider who is monitoring your Coumadin knows when you have started a new medication including herbals and non-prescription products.  Coumadin (Warfarin)  Major Drug Interactions  Increased Warfarin Effect Decreased Warfarin Effect  Alcohol (large quantities) Antibiotics (esp. Septra/Bactrim, Flagyl, Cipro) Amiodarone (Cordarone) Aspirin (ASA) Cimetidine (Tagamet) Megestrol (Megace) NSAIDs (  ibuprofen, naproxen, etc.) °Piroxicam (Feldene) °Propafenone (Rythmol SR) °Propranolol (Inderal) °Isoniazid (INH) °Posaconazole (Noxafil) Barbiturates (Phenobarbital) °Carbamazepine (Tegretol) °Chlordiazepoxide (Librium) °Cholestyramine (Questran) °Griseofulvin °Oral Contraceptives °Rifampin °Sucralfate (Carafate) °Vitamin K  ° °Coumadin® (Warfarin) Major Herbal Interactions  °Increased Warfarin Effect Decreased Warfarin Effect   °Garlic °Ginseng °Ginkgo biloba Coenzyme Q10 °Green tea °St. John’s wort   ° °Coumadin® (Warfarin) FOOD Interactions  °Eat a consistent number of servings per week of foods HIGH in Vitamin K °(1 serving = ½ cup)  °Collards (cooked, or boiled & drained) °Kale (cooked, or boiled & drained) °Mustard greens (cooked, or boiled & drained) °Parsley *serving size only = ¼ cup °Spinach (cooked, or boiled & drained) °Swiss chard (cooked, or boiled & drained) °Turnip greens (cooked, or boiled & drained)  °Eat a consistent number of servings per week of foods MEDIUM-HIGH in Vitamin K °(1 serving = 1 cup)  °Asparagus (cooked, or boiled & drained) °Broccoli (cooked, boiled & drained, or raw & chopped) °Brussel sprouts (cooked, or boiled & drained) *serving size only = ½ cup °Lettuce, raw (green leaf, endive, romaine) °Spinach, raw °Turnip greens, raw & chopped  ° °These websites have more information on Coumadin (warfarin):  www.coumadin.com; °www.ahrq.gov/consumer/coumadin.htm; ° ° °

## 2015-01-12 NOTE — Progress Notes (Addendum)
ANTICOAGULATION CONSULT NOTE - Follow Up Consult  Pharmacy Consult for Heparin and Coumadin Indication: hx mechanical mitral and aortic valves  Allergies  Allergen Reactions  . Sulfa Antibiotics Photosensitivity   Patient Measurements: Height: 5\' 7"  (170.2 cm) Weight: 129 lb (58.514 kg) IBW/kg (Calculated) : 61.6 Heparin Dosing Weight: 53 kg  Vital Signs: Temp: 97.7 F (36.5 C) (12/23 0450) Temp Source: Oral (12/23 0450) BP: 143/61 mmHg (12/23 0450) Pulse Rate: 88 (12/23 0450)  Labs:  Recent Labs  01/10/15 0640 01/10/15 1910 01/11/15 0425 01/12/15 0425  HGB 13.6  --  13.1 12.8  HCT 41.9  --  40.6 38.9  PLT 215  --  208 212  LABPROT 17.5*  --  19.3* 24.8*  INR 1.43  --  1.63* 2.27*  HEPARINUNFRC 0.21* 0.45 0.34 0.48  CREATININE 0.89  --   --   --     Estimated Creatinine Clearance: 55.9 mL/min (by C-G formula based on Cr of 0.89).  Ass[facility]: [facility]  Was on Coumadin prior to admission but held for cath, done 12/20.  Coumadin resumed 12/21. Heparin level now within desired goal range at 0.48.  INR trending up to 1.63 today.  No noted bleeding complications.  Home Coumadin regimen: 5 mg daily except 7.5 mg on Mondays, but INR only 1.82 on admit. Hx retroperitoneal bleed 06/2013 after femoral access for cerebral stent procedure.  Goal of Therapy:  Heparin level 0.3-0.7 units/ml 2.5-3.5 Monitor platelets by anticoagulation protocol: Yes   Plan:   - Continue IV Heparin drip at 1300 units/hr  - F/U AM labs and adjust as needed  - Coumadin 10 mg x 1 more dose today. Expect INR to continue to rise on doses above home dose, likely will not need doses of 10 mg much longer.  Spoke with patient - in the past, she has required 7.5 mg 3 to 4 days a week to keep INR in 2.5-3.5 range.  I'd recommend D/c on Coumadin 7.5 mg daily except 5 mg on Mon, Wed, Fri.  - Daily heparin level, PT/INR and CBC.  Tad MooreJessica Gaylan Fauver, Pharm D, BCPS  Clinical  Pharmacist Pager [phone]  01/12/2015 9:50 AM

## 2015-01-13 DIAGNOSIS — Z7901 Long term (current) use of anticoagulants: Secondary | ICD-10-CM

## 2015-01-13 DIAGNOSIS — E038 Other specified hypothyroidism: Secondary | ICD-10-CM

## 2015-01-13 LAB — CBC
HEMATOCRIT: 37.8 % (ref 36.0–46.0)
Hemoglobin: 12.5 g/dL (ref 12.0–15.0)
MCH: 32.5 pg (ref 26.0–34.0)
MCHC: 33.1 g/dL (ref 30.0–36.0)
MCV: 98.2 fL (ref 78.0–100.0)
PLATELETS: 222 10*3/uL (ref 150–400)
RBC: 3.85 MIL/uL — AB (ref 3.87–5.11)
RDW: 17 % — AB (ref 11.5–15.5)
WBC: 7 10*3/uL (ref 4.0–10.5)

## 2015-01-13 LAB — PROTIME-INR
INR: 3 — ABNORMAL HIGH (ref 0.00–1.49)
Prothrombin Time: 30.6 seconds — ABNORMAL HIGH (ref 11.6–15.2)

## 2015-01-13 LAB — HEPARIN LEVEL (UNFRACTIONATED): Heparin Unfractionated: 0.36 IU/mL (ref 0.30–0.70)

## 2015-01-13 MED ORDER — NICOTINE 21 MG/24HR TD PT24: 21.0000 mg | MEDICATED_PATCH | Freq: Every day | TRANSDERMAL | Status: DC

## 2015-01-13 MED ORDER — METOPROLOL SUCCINATE ER 25 MG PO TB24: 25.0000 mg | ORAL_TABLET | Freq: Every day | ORAL | Status: DC

## 2015-01-13 MED ORDER — WARFARIN SODIUM 7.5 MG PO TABS: 7.5000 mg | ORAL_TABLET | Freq: Once | ORAL | Status: DC

## 2015-01-13 MED ORDER — NICOTINE 7 MG/24HR TD PT24: 7.0000 mg | MEDICATED_PATCH | Freq: Every day | TRANSDERMAL | Status: DC

## 2015-01-13 MED ORDER — DOCUSATE SODIUM 100 MG PO CAPS: 100.0000 mg | ORAL_CAPSULE | Freq: Two times a day (BID) | ORAL | Status: DC

## 2015-01-13 MED ORDER — NICOTINE 14 MG/24HR TD PT24: 14.0000 mg | MEDICATED_PATCH | Freq: Every day | TRANSDERMAL | Status: DC

## 2015-01-13 MED ORDER — IPRATROPIUM-ALBUTEROL 0.5-2.5 (3) MG/3ML IN SOLN: 3.0000 mL | Freq: Four times a day (QID) | RESPIRATORY_TRACT | Status: DC | PRN

## 2015-01-13 NOTE — Progress Notes (Signed)
Patient Name: Jasmine West Date of Encounter: 01/13/2015   Primary Cardiologist: Dr. Ave Filter [place] (Jul. 14, 2015 - Present) [phone] (Work)  SUBJECTIVE  Feeling well. No chest pain, sob or palpitations. L forearm IV site mildly discomfort, erythema improved per patient.   CURRENT MEDS . docusate sodium  100 mg Oral BID  . fluticasone  2 spray Each Nare QPM  . furosemide  40 mg Oral Daily  . guaiFENesin  600 mg Oral BID  . levothyroxine  50 mcg Oral QHS  . lidocaine  1 patch Transdermal Q24H  . losartan  50 mg Oral QPM  . metoprolol succinate  25 mg Oral Daily  . montelukast  10 mg Oral q morning - 10a  . nicotine  21 mg Transdermal Daily  . sodium chloride  3 mL Intravenous Q12H  . sucralfate  1 g Oral QID  . Umeclidinium Bromide  1 puff Inhalation QHS  . warfarin  7.5 mg Oral ONCE-1800  . Warfarin - Pharmacist Dosing Inpatient   Does not apply q1800    OBJECTIVE  Filed Vitals:   01/12/15 1015 01/12/15 1314 01/12/15 2118 01/13/15 0500  BP: 107/51 141/61 156/62 149/89  Pulse: 79 70 76   Temp:  97.4 F (36.3 C) 98 F (36.7 C) 97.5 F (36.4 C)  TempSrc:  Oral Oral Oral  Resp:  18 20 18   Height:      Weight:    130 lb (58.968 kg)  SpO2:  97% 100% 97%    Intake/Output Summary (Last 24 hours) at 01/13/15 0741 Last data filed at 01/13/15 0600  Gross per 24 hour  Intake   1682 ml  Output   1875 ml  Net   -193 ml   Filed Weights   01/11/15 0513 01/12/15 0443 01/13/15 0500  Weight: 127 lb 11.2 oz (57.924 kg) 129 lb (58.514 kg) 130 lb (58.968 kg)    PHYSICAL EXAM  General: Pleasant, NAD. Neuro: Alert and oriented X 3. Moves all extremities spontaneously. Psych: Normal affect. HEENT:  Normal  Neck: Supple without bruits or JVD. Lungs:  Resp regular and unlabored, CTA. Heart: RRR no s3, s4, or murmurs. Abdomen: Soft, non-tender, non-distended, BS + x 4.  Extremities: No clubbing, cyanosis or edema. DP/PT/Radials 2+ and equal  bilaterally.  Accessory Clinical Findings  CBC  Recent Labs  01/12/15 0425 01/13/15 0246  WBC 8.6 7.0  HGB 12.8 12.5  HCT 38.9 37.8  MCV 98.5 98.2  PLT 212 222   TELE  afib at rate of 70-80s. Transiently in 100s  Radiology/Studies  Ct Head Wo Contrast  01/07/2015  CLINICAL DATA:  Headache with left leg weakness today. On anticoagulation. History of brain aneurysm. Code stroke. Initial encounter. EXAM: CT HEAD WITHOUT CONTRAST TECHNIQUE: Contiguous axial images were obtained from the base of the skull through the vertex without intravenous contrast. COMPARISON:  Head CT 11/11/2013. FINDINGS: There is no evidence of acute intracranial hemorrhage, mass lesion, brain edema or extra-axial fluid collection. The ventricles and subarachnoid spaces are appropriately sized for age. There is no CT evidence of acute cortical infarction. There is chronic subcortical encephalomalacia in the posterior right frontal lobe. 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 There is also an old lacunar infarct anteriorly in the left caudate body. Intracranial vascular calcifications are noted. The visualized paranasal sinuses, mastoid air cells and middle ears are clear. The calvarium is intact. TMJ degenerative changes are present bilaterally. IMPRESSION: Stable head CT demonstrating chronic  small vessel ischemic changes, but no CT evidence of acute stroke or acute hemorrhage. These results were called by telephone at the time of interpretation on 01/07/2015 at 5:02 pm to Dr. Samuel Jester , who verbally acknowledged these results. Electronically Signed   By: Carey Bullocks M.D.   On: 01/07/2015 17:04   Ct Chest W Contrast  01/09/2015  CLINICAL DATA:  69 year old female with dysphagia. EXAM: CT CHEST, ABDOMEN, AND PELVIS WITH CONTRAST TECHNIQUE: Multidetector CT imaging of the chest, abdomen and pelvis was performed following the standard protocol during bolus administration of intravenous contrast.  CONTRAST:  80mL OMNIPAQUE IOHEXOL 300 MG/ML  SOLN COMPARISON:  None. FINDINGS: There is emphysematous changes of the lungs. No focal consolidation, pleural effusion, or pneumothorax. The central airways are patent. There is atherosclerotic calcification of the thoracic aorta. No aneurysm or dissection. The central pulmonary arteries appear patent. There is severe cardiomegaly. Mechanical mitral and aortic valves. No pericardial effusion. A slightly enlarged left hilar lymph node measuring 1.5 cm in short axis. Mildly prominent subcarinal lymph nodes noted. The visualized esophagus is grossly unremarkable. The thyroid gland is not well visualized. There is no axillary adenopathy. The chest wall soft tissues appear unremarkable. No intra-abdominal free air or free fluid. There is minimal irregularity of the hepatic contour. The gallbladder is not visualized, likely surgically absent. Mild periportal edema. The pancreas, spleen, and adrenal glands appear unremarkable. Bilateral renal cortical irregularity, likely related to recurrent infarct and scarring. A 1.7 cm right renal cyst. Subcentimeter left renal hypodense lesion is too small to characterize. There is no hydronephrosis on either side. The visualized ureters and urinary bladder appear unremarkable. Small uterine calcification may represent calcified fibroids. Ultrasound may provide better evaluation of the pelvic structures. There is mild apparent thickening of the proximal duodenum, likely related to underdistention. Duodenitis is less likely. Clinical correlation is recommended. There is a 1.8 cm nodular density along the wall of the proximal duodenum (coronal series 5, image 33 and axial series 2 image 76). This area of the duodenum is not evaluated due to is non opacification with oral contrast. This may represent a focal area of thickening or a duodenal diverticulum. Other etiologies are not excluded. Endoscopy may provide better evaluation if clinically  indicated. There is sigmoid diverticulosis without active inflammation. Moderate stool noted throughout the colon. No evidence of bowel obstruction. Appendectomy. Advanced aortoiliac atherosclerotic disease. The origins of the celiac axis, SMA, IMA as well as the origins of the renal arteries remain patent. No portal venous gas identified. There is retrograde flow of contrast into the IVC and hepatic veins compatible with a degree of right heart dysfunction. There is no adenopathy in the abdomen pelvis. There is multilevel degenerative changes of the spine. No acute fracture. Grade 1 L2-L3 retrolisthesis. Median sternotomy wires. IMPRESSION: Emphysema.  No focal consolidation or pneumothorax. Severe cardiomegaly with evidence of right cardiac dysfunction. No evidence of bowel obstruction. Apparent mild thickening of the duodenal wall, likely related to underdistention. Duodenitis is not excluded. Clinical correlation is recommended. Focal nodular thickening along the duodenal wall may represent a diverticulum. Other etiologies are not excluded. Endoscopy may provide better evaluation. Electronically Signed   By: Elgie Collard M.D.   On: 01/09/2015 02:04   Ct Angio Low Extrem Left W/cm &/or Wo/cm  01/07/2015  CLINICAL DATA:  Possible ischemic left foot. EXAM: CT ANGIOGRAPHY LOWER LEFT EXTREMITY TECHNIQUE: Arterially timed CT angiography of the left lower extremity was performed from the level of the upper popliteal  artery through the toes. This protocol was specifically requested. CONTRAST:  100mL OMNIPAQUE IOHEXOL 350 MG/ML SOLN COMPARISON:  None. FINDINGS: Diffuse atheromatous wall thickening and calcification of the popliteal artery without flow limiting stenosis. Heavily diseased anterior tibial artery with underfilling and occlusion above the ankle. Chronic appearing occlusion or near occlusion of the tibioperoneal trunk with reconstituted flow to the under-filled and diffusely irregular posterior tibial  artery from muscular branch, with continuation to the ankle. There is thready reconstitution of the peroneal artery, which likely accounts for the faintly opacified dorsalis pedis, reportedly detected by Doppler. No acute appearing arterial occlusion identified. There is secondary findings of inflow disease on the left: the study timing was triggered based on the left popliteal artery and there is asymmetric venous opacification of the right lower extremity (partially seen below the knee on reformats). No soft tissue gas or evidence of osteomyelitis. These results were called by telephone at the time of interpretation on 01/07/2015 at 9:20 pm to Dr. Gerhard MunchOBERT LOCKWOOD , who verbally acknowledged these results. Review of the MIP images confirms the above findings. IMPRESSION: 1. Extensive popliteal and infrapopliteal atherosclerosis with 2 vessel runoff at the ankle. 2. Secondary findings of left lower extremity arterial inflow disease, as above. Electronically Signed   By: Marnee SpringJonathon  Watts M.D.   On: 01/07/2015 21:34   Mr Maxine GlennMra Head Wo Contrast  01/08/2015  CLINICAL DATA:  History of stroke. EXAM: MRA HEAD WITHOUT CONTRAST TECHNIQUE: Angiographic images of the Circle of Willis were obtained using MRA technique without intravenous contrast. COMPARISON:  CT head 01/07/2015 FINDINGS: Internal carotid artery is widely patent through the skullbase. There is diffuse atherosclerotic narrowing of the cavernous carotid bilaterally causing moderate to severe diffuse stenosis in both cavernous carotid, right greater than left. Supraclinoid internal carotid artery returns to normal caliber. Anterior and middle cerebral arteries normal in caliber without significant stenosis. Both vertebral arteries patent to the basilar. Left PICA widely patent. Right PICA not visualized. Basilar widely patent. Right AICA patent. Superior cerebellar and posterior cerebral arteries patent without stenosis. Negative for cerebral aneurysm  IMPRESSION: Moderate to severe diffuse atherosclerotic stenosis of the cavernous carotid bilaterally, right greater than left. Otherwise no significant intracranial stenosis. Electronically Signed   By: Marlan Palauharles  Clark M.D.   On: 01/08/2015 09:52   Ct Abdomen Pelvis W Contrast  01/09/2015  CLINICAL DATA:  69 year old female with dysphagia. EXAM: CT CHEST, ABDOMEN, AND PELVIS WITH CONTRAST TECHNIQUE: Multidetector CT imaging of the chest, abdomen and pelvis was performed following the standard protocol during bolus administration of intravenous contrast. CONTRAST:  80mL OMNIPAQUE IOHEXOL 300 MG/ML  SOLN COMPARISON:  None. FINDINGS: There is emphysematous changes of the lungs. No focal consolidation, pleural effusion, or pneumothorax. The central airways are patent. There is atherosclerotic calcification of the thoracic aorta. No aneurysm or dissection. The central pulmonary arteries appear patent. There is severe cardiomegaly. Mechanical mitral and aortic valves. No pericardial effusion. A slightly enlarged left hilar lymph node measuring 1.5 cm in short axis. Mildly prominent subcarinal lymph nodes noted. The visualized esophagus is grossly unremarkable. The thyroid gland is not well visualized. There is no axillary adenopathy. The chest wall soft tissues appear unremarkable. No intra-abdominal free air or free fluid. There is minimal irregularity of the hepatic contour. The gallbladder is not visualized, likely surgically absent. Mild periportal edema. The pancreas, spleen, and adrenal glands appear unremarkable. Bilateral renal cortical irregularity, likely related to recurrent infarct and scarring. A 1.7 cm right renal cyst. Subcentimeter left renal hypodense  lesion is too small to characterize. There is no hydronephrosis on either side. The visualized ureters and urinary bladder appear unremarkable. Small uterine calcification may represent calcified fibroids. Ultrasound may provide better evaluation of the  pelvic structures. There is mild apparent thickening of the proximal duodenum, likely related to underdistention. Duodenitis is less likely. Clinical correlation is recommended. There is a 1.8 cm nodular density along the wall of the proximal duodenum (coronal series 5, image 33 and axial series 2 image 76). This area of the duodenum is not evaluated due to is non opacification with oral contrast. This may represent a focal area of thickening or a duodenal diverticulum. Other etiologies are not excluded. Endoscopy may provide better evaluation if clinically indicated. There is sigmoid diverticulosis without active inflammation. Moderate stool noted throughout the colon. No evidence of bowel obstruction. Appendectomy. Advanced aortoiliac atherosclerotic disease. The origins of the celiac axis, SMA, IMA as well as the origins of the renal arteries remain patent. No portal venous gas identified. There is retrograde flow of contrast into the IVC and hepatic veins compatible with a degree of right heart dysfunction. There is no adenopathy in the abdomen pelvis. There is multilevel degenerative changes of the spine. No acute fracture. Grade 1 L2-L3 retrolisthesis. Median sternotomy wires. IMPRESSION: Emphysema.  No focal consolidation or pneumothorax. Severe cardiomegaly with evidence of right cardiac dysfunction. No evidence of bowel obstruction. Apparent mild thickening of the duodenal wall, likely related to underdistention. Duodenitis is not excluded. Clinical correlation is recommended. Focal nodular thickening along the duodenal wall may represent a diverticulum. Other etiologies are not excluded. Endoscopy may provide better evaluation. Electronically Signed   By: Elgie Collard M.D.   On: 01/09/2015 02:04    ASSESSMENT AND PLAN  1. 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 CT scan of chest demonstrates coronary anatomy and left system (LAD) traverses lateral to PA (not dangerous). No need to obtain outpatient CT of coronaries  - continue BB, ARB. No ASA due to on coumadin.   2. Neck pain: carotid U/S 1-39% bilaterally. Resolved  3. PAD with extensive popliteal and infrapopliteal atherosclerosis with 2v runoff, no acute arterial occlusion - underwent LE angiography initially due to leg pain, subtherapeutic INR - Bilateral ankle brachial indices appear within normal limits.  4. Permanent afib on on chronic coumadin - digoxin stopped due to transient bradycardia - Changed Toprol XL  from 12.5 at night to  PO QD.   - Tele with rate mostly in 70-80s. Transiently in 100s. Continue current dose. F/u with  Dr. Ave Filter within 1-2 weeks.   5. 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 Dosage listed on pharmacy note.   6. Prior CVA 7. H/o brain aneurysm s/p 4 brain stents 8. HTN 9. HLD 10. Tobacco abuse: ready to quit per patient  Signed, Bhagat,Bhavinkumar PA-C Pager [phone]  I have seen and examined the patient along with Bhagat,Bhavinkumar PA-C.  I have reviewed the chart, notes and new data.  I agree with PA's note.  Key new complaints: feels well, wants to go home Key examination changes: irregular rhythm, well controlled rate Key new findings / data: INR 3.0  PLAN: Ready for DC home from Cardiology point of view. DC heparin IV. INR f/U with Dr. Rockne Menghini  in [place]. Should recheck no later than Tuesday. She prefers digoxin to metoprolol, but that re-adjustment can be made by her primary Cardiologist if desired. Would leave on the 25 mg metoprolol succinate for now.  Thurmon Fair, MD, [facility] [phone] 01/13/2015, 8:12  AM

## 2015-01-13 NOTE — Progress Notes (Addendum)
ANTICOAGULATION CONSULT NOTE - Follow Up Consult  Pharmacy Consult for Heparin and Coumadin Indication: hx mechanical mitral and aortic valves  Allergies  Allergen Reactions  . Sulfa Antibiotics Photosensitivity   Patient Measurements: Height: 5\' 7"  (170.2 cm) Weight: 130 lb (58.968 kg) IBW/kg (Calculated) : 61.6 Heparin Dosing Weight: 53 kg  Vital Signs: Temp: 97.5 F (36.4 C) (12/24 0500) Temp Source: Oral (12/24 0500) BP: 149/89 mmHg (12/24 0500) Pulse Rate: 76 (12/23 2118)  Labs:  Recent Labs  01/11/15 0425 01/12/15 0425 01/13/15 0246  HGB 13.1 12.8 12.5  HCT 40.6 38.9 37.8  PLT 208 212 222  LABPROT 19.3* 24.8* 30.6*  INR 1.63* 2.27* 3.00*  HEPARINUNFRC 0.34 0.48 0.36    Estimated Creatinine Clearance: 56.3 mL/min (by C-G formula based on Cr of 0.89).  Assessment: 69 y/o F w/ HX AVR and MVR at [facility].  Was on Coumadin prior to admission but held for cath, done 12/20.  Coumadin resumed 12/21. No noted bleeding complications.  INR 2.27>3, HL 0.36. Hgb 12.5, plts 222  Home Coumadin regimen: 5 mg daily except 7.5 mg on Mondays, but INR only 1.82 on admit. Hx retroperitoneal bleed 06/2013 after femoral access for cerebral stent procedure.  Goal of Therapy:  Heparin level 0.3-0.7 units/ml 2.5-3.5 - INR Monitor platelets by anticoagulation protocol: Yes   Plan:   - Continue IV Heparin drip at 1300 units/hr  - Coumadin 7.5 mg x 1 tonight  -  Recommend d/c on Coumadin 7.5 mg daily except 5 mg on    Mon, Wed, Fri.  - Daily heparin level, PT/INR and CBC.  Sandi CarneNick Arlicia Paquette, PharmD Pharmacy Resident Pager: [phone]  01/13/2015 7:18 AM   __________________________________________________ ADDENDUM:  Per MD; heparin gtt discontinued. Will continue warfarin.

## 2015-01-13 NOTE — Progress Notes (Signed)
CARDIAC REHAB PHASE I   PRE:  Rate/Rhythm: 75 a. fib  BP:  Sitting: 128/71        SaO2: 95 RA  MODE:  Ambulation: 550 ft   POST:  Rate/Rhythm: 121 a. fib  BP:  Sitting: 166/60         SaO2: 95 RA  Pt in room, states she hopes to discharge today. Pt ambulated 550 ft on RA, independent, slow, steady gait, tolerated well. Pt denies CP, dizziness, DOE, declined rest stop. Pt HR slightly elevated with ambulation, asymptomatic. Pt states she has no questions at this time regarding yesterdays's education. Pt to edge of bed per pt request after walk, call bell within reach.   [phone] Joylene GrapesMonge, Jamiaya Bina C, RN, BSN 01/13/2015 8:27 AM

## 2015-01-13 NOTE — Plan of Care (Signed)
Problem: Physical Regulation: Goal: Ability to maintain clinical measurements within normal limits will improve Outcome: Completed/Met Date Met:  01/13/15 Patients vital signs and lab results are within defined limits. Goal: Will remain free from infection Outcome: Completed/Met Date Met:  01/13/15 Patient has no signs/symptoms of infection. Patient understands infection prevention measures utilized in the hospital including hand hygiene.  Problem: Skin Integrity: Goal: Risk for impaired skin integrity will decrease Outcome: Completed/Met Date Met:  01/13/15 Patient is at a low risk for skin breakdown per the Braden scale. Patient has good mobility and is at a low risk for pressure sores. Patient's skin is assessed and documented by an RN every shift.  Problem: Tissue Perfusion: Goal: Risk factors for ineffective tissue perfusion will decrease Outcome: Completed/Met Date Met:  01/13/15 Patient is on the heparin/coumadin bridge with a goal INR of 2.5. This morning, INR is 3.0 so the goal has been met. Patient is at a low risk for VTE due to anticoagulation.  Problem: Activity: Goal: Risk for activity intolerance will decrease Outcome: Completed/Met Date Met:  01/13/15 Patient is able to ambulate and perform ADLs per baseline.   Problem: Nutrition: Goal: Adequate nutrition will be maintained Outcome: Completed/Met Date Met:  01/13/15 Patient has had adequate PO intake with no complaints of abnormal appetite.  Problem: Bowel/Gastric: Goal: Will not experience complications related to bowel motility Outcome: Completed/Met Date Met:  01/13/15 Patient was c/o constipation this admission, however, after the administration of a dulcolax suppository on 12/23, patient reports 2 normal bowel movements.

## 2015-01-13 NOTE — Discharge Summary (Signed)
Physician Discharge Summary  Jasmine West ZOX:[id number] DOB: 01-Mar-1945 DOA: 01/07/2015  PCP: Elise Benne, MD  Admit date: 01/07/2015 Discharge date: 01/13/2015  Time spent: 45 minutes  Recommendations for Outpatient Follow-up:  1. Follow HR  Discharge Condition: stable   Discharge Diagnoses:  Principal Problem:   NSTEMI (non-ST elevated myocardial infarction) (HCC) Active Problems:   Hypothyroidism   COPD (chronic obstructive pulmonary disease) (HCC)   Tobacco use   Elevated troponin   Hypertension   S/P mitral valve replacement with metallic valve   Atrial fibrillation (HCC)   Subtherapeutic international normalized ratio (INR)   S/P aortic valve replacement with metallic valve   Anomalous coronary artery origin   Abnormal findings on cardiac catheterization   History of present illness:  Patient is a 69 year old female past oral history of atrial fibrillation, brain aneurysm status post brain stent 4, COPD with ongoing tobacco abuse and status post mechanical aortic and mitral valve replacement initially presented to [facility] on 12/18 with complaints of left-sided neck, upper extremity and lower extremity on sensation plus left foot pain who was concerned she may have had an embolic stroke even that her foot felt cool and so she came for further evaluation. Patient transferred down to [facility] for vascular surgery evaluation who after checking CT angiogram noted infra inguinal arterial occlusive disease but no evidence of acute arterial occlusion. Patient was evaluated by neurology who recommended MRI. Her troponin was also noted to be mildly elevated at 0.86 hospitalist will call for further evaluation and admission. Patient's chronic Coumadin was subtherapeutic on admission  Following admission, MRI of the brain unremarkable. Given presenting symptoms of left facial and neck pain, asked her surgery followed up ordering a carotid duplex scan.  However, her repeat troponin jumped to 3.7 and another after that was 4.2. Cardiology consulted who evaluated patient and recommended cardiac catheterization. EKG noted some ST depressions. Her left sided symptoms have since resolved following admission. 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 CT scan of chest done earlier demonstrated coronary anatomy and left system traversing lateral to pulmonary artery which is not dangerous. It was felt that given no obstructive CAD, no need to obtain outpatient CT of coronaries. After follow-up by vascular surgery, there is no evidence of acute arterial occlusion with ABIs providing reassurance.  Hospital Course:  Principal Problem: Non-STEMI   - Echo 12/19 showed EF of 40-45% with mechanical AVR, without significant perivalvular regurg, mechanical MR, severely dilated LA  - Cardiology was consulted. 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 This was explained to the patient and family by cardiology. - Anticoagulation has been resumed  Active Problems:  H/o valvular dx s/p both mechanical mitral and aortic valve replacement in 1995 at [facility] - bridged Coumadin with IV heparin until therapeutic - INR now 3.00   Atrial fibrillation (HCC): Persistent. INR subtherapeutic.  - Chads 2 VASC score of 6.   - Digoxin discontinued-  increased Toprol to 25mg  daily per cardiology request- previously on 12.5 mg at bedtime only (short acting) -  INR therapeutic now at 3.00   Hypothyroidism: Continue Synthroid   COPD (chronic obstructive pulmonary disease)  (HCC):  - Stable.  -  Continue prn nebulizers. Breathing comfortably on room air   Tobacco use - Continue nicotine patch, counseled strongly on smoking cessation - patches prescribed   Hypertension - Currently stable  Chronic systolic heart failure: - Noted by cardiac cath. Euvolemic, on oral Lasix  Neck pain: carotid U/S 1-39% bilaterally.   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  Consults  Cardiology Vascular surgery   Discharge Exam: Filed Weights   01/11/15 1610 01/12/15 0443 01/13/15 0500  Weight: 57.924 kg (127 lb 11.2 oz) 58.514 kg (129 lb) 58.968 kg (130 lb)   Filed Vitals:   01/12/15 2118 01/13/15 0500  BP: 156/62 149/89  Pulse: 76   Temp: 98 F (36.7 C) 97.5 F (36.4 C)  Resp: 20 18    General: AAO x 3, no distress Cardiovascular: RRR, no murmurs  Respiratory: clear to auscultation bilaterally GI: soft, non-tender, non-distended, bowel sound positive  Discharge Instructions You were cared for by a hospitalist during your hospital stay. If you have any questions about your discharge medications or the care you received while you were in the hospital after you are discharged, you can call the unit and asked to speak with the hospitalist on call if the hospitalist that took care of you is not available. Once you are discharged, your primary care physician will handle any further medical issues. Please note that NO REFILLS for any discharge medications will be authorized once you are discharged, as it is imperative that you return to your primary care physician (or establish a relationship with a primary care physician if you do not have one) for your aftercare needs so that they can reassess your need for medications and  monitor your lab values.  Discharge Instructions    Amb Referral to Cardiac Rehabilitation    Complete by:  As directed   Diagnosis:  Myocardial Infarction     Diet - low sodium heart healthy    Complete by:  As directed      Increase activity slowly    Complete by:  As directed             Medication List    STOP taking these medications        metoprolol tartrate 25 MG tablet  Commonly known as:  LOPRESSOR      TAKE these medications        ALPRAZolam 1 MG tablet  Commonly known as:  XANAX  Take 1 mg by mouth 2 (two) times daily as needed for anxiety.     COUMADIN 5 MG tablet  Generic drug:  warfarin  Take 5-7.5 mg by mouth at bedtime. Patient takes 5 mg everyday besides on Monday when patient takes 7.5 mg     digoxin 0.25 MG tablet  Commonly known as:  LANOXIN  Take 0.25 mg by mouth every morning.     docusate sodium 100 MG capsule  Commonly known as:  COLACE  Take 1 capsule (100 mg total) by mouth 2 (two) times daily.     fluticasone 50 MCG/ACT nasal spray  Commonly known as:  FLONASE  Place 2 sprays into both nostrils every evening.     furosemide 20 MG tablet  Commonly known as:  LASIX  Take 20 mg by mouth as needed for fluid.     furosemide 40  MG tablet  Commonly known as:  LASIX  Take 1 tablet (40 mg total) by mouth daily.     guaiFENesin 600 MG 12 hr tablet  Commonly known as:  MUCINEX  Take 600 mg by mouth 2 (two) times daily.     INCRUSE ELLIPTA 62.5 MCG/INH Aepb  Generic drug:  Umeclidinium Bromide  Inhale 1 puff into the lungs at bedtime.     ipratropium-albuterol 0.5-2.5 (3) MG/3ML Soln  Commonly known as:  DUONEB  Take 3 mLs by nebulization every 6 (six) hours as needed.     levothyroxine 50 MCG tablet  Commonly known as:  SYNTHROID, LEVOTHROID  Take 50 mcg by mouth at bedtime.     losartan 50 MG tablet  Commonly known as:  COZAAR  Take 50 mg by mouth every evening.     metoprolol succinate 25 MG 24 hr tablet  Commonly known as:   TOPROL-XL  Take 1 tablet (25 mg total) by mouth daily.     montelukast 10 MG tablet  Commonly known as:  SINGULAIR  Take 10 mg by mouth every morning.     nicotine 21 mg/24hr patch  Commonly known as:  NICODERM CQ - dosed in mg/24 hours  Place 1 patch (21 mg total) onto the skin daily.     nicotine 14 mg/24hr patch  Commonly known as:  NICODERM CQ  Place 1 patch (14 mg total) onto the skin daily. Start when 21 mg patches have finised     nicotine 7 mg/24hr patch  Commonly known as:  NICODERM CQ  Place 1 patch (7 mg total) onto the skin daily. Start when 14 mg patches have finished     oxymetazoline 0.05 % nasal spray  Commonly known as:  AFRIN  Place 1 spray into both nostrils as needed for congestion.     promethazine 12.5 MG tablet  Commonly known as:  PHENERGAN  Take 12.5 mg by mouth every 6 (six) hours as needed for nausea or vomiting.     sucralfate 1 G tablet  Commonly known as:  CARAFATE  Take 1 tablet (1 g total) by mouth 4 (four) times daily.     VENTOLIN HFA 108 (90 BASE) MCG/ACT inhaler  Generic drug:  albuterol  Inhale 2 puffs into the lungs 4 (four) times daily.     albuterol (2.5 MG/3ML) 0.083% nebulizer solution  Commonly known as:  PROVENTIL  Take 2.5 mg by nebulization at bedtime.       Allergies  Allergen Reactions  . Sulfa Antibiotics Photosensitivity      The results of significant diagnostics from this hospitalization (including imaging, microbiology, ancillary and laboratory) are listed below for reference.    Significant Diagnostic Studies: Ct Head Wo Contrast  01/07/2015  CLINICAL DATA:  Headache with left leg weakness today. On anticoagulation. History of brain aneurysm. Code stroke. Initial encounter. EXAM: CT HEAD WITHOUT CONTRAST TECHNIQUE: Contiguous axial images were obtained from the base of the skull through the vertex without intravenous contrast. COMPARISON:  Head CT 11/11/2013. FINDINGS: There is no evidence of acute intracranial  hemorrhage, mass lesion, brain edema or extra-axial fluid collection. The ventricles and subarachnoid spaces are appropriately sized for age. There is no CT evidence of acute cortical infarction. There is chronic subcortical encephalomalacia in the posterior right frontal lobe. 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 There is also an old lacunar infarct anteriorly in the left caudate body. Intracranial vascular calcifications are noted. The visualized paranasal sinuses, mastoid air  cells and middle ears are clear. The calvarium is intact. TMJ degenerative changes are present bilaterally. IMPRESSION: Stable head CT demonstrating chronic small vessel ischemic changes, but no CT evidence of acute stroke or acute hemorrhage. These results were called by telephone at the time of interpretation on 01/07/2015 at 5:02 pm to Dr. Samuel Jester , who verbally acknowledged these results. Electronically Signed   By: Carey Bullocks M.D.   On: 01/07/2015 17:04   Ct Chest W Contrast  01/09/2015  CLINICAL DATA:  69 year old female with dysphagia. EXAM: CT CHEST, ABDOMEN, AND PELVIS WITH CONTRAST TECHNIQUE: Multidetector CT imaging of the chest, abdomen and pelvis was performed following the standard protocol during bolus administration of intravenous contrast. CONTRAST:  80mL OMNIPAQUE IOHEXOL 300 MG/ML  SOLN COMPARISON:  None. FINDINGS: There is emphysematous changes of the lungs. No focal consolidation, pleural effusion, or pneumothorax. The central airways are patent. There is atherosclerotic calcification of the thoracic aorta. No aneurysm or dissection. The central pulmonary arteries appear patent. There is severe cardiomegaly. Mechanical mitral and aortic valves. No pericardial effusion. A slightly enlarged left hilar lymph node measuring 1.5 cm in short axis. Mildly prominent subcarinal lymph nodes noted. The visualized esophagus is grossly unremarkable. The thyroid gland is not well visualized. There is no  axillary adenopathy. The chest wall soft tissues appear unremarkable. No intra-abdominal free air or free fluid. There is minimal irregularity of the hepatic contour. The gallbladder is not visualized, likely surgically absent. Mild periportal edema. The pancreas, spleen, and adrenal glands appear unremarkable. Bilateral renal cortical irregularity, likely related to recurrent infarct and scarring. A 1.7 cm right renal cyst. Subcentimeter left renal hypodense lesion is too small to characterize. There is no hydronephrosis on either side. The visualized ureters and urinary bladder appear unremarkable. Small uterine calcification may represent calcified fibroids. Ultrasound may provide better evaluation of the pelvic structures. There is mild apparent thickening of the proximal duodenum, likely related to underdistention. Duodenitis is less likely. Clinical correlation is recommended. There is a 1.8 cm nodular density along the wall of the proximal duodenum (coronal series 5, image 33 and axial series 2 image 76). This area of the duodenum is not evaluated due to is non opacification with oral contrast. This may represent a focal area of thickening or a duodenal diverticulum. Other etiologies are not excluded. Endoscopy may provide better evaluation if clinically indicated. There is sigmoid diverticulosis without active inflammation. Moderate stool noted throughout the colon. No evidence of bowel obstruction. Appendectomy. Advanced aortoiliac atherosclerotic disease. The origins of the celiac axis, SMA, IMA as well as the origins of the renal arteries remain patent. No portal venous gas identified. There is retrograde flow of contrast into the IVC and hepatic veins compatible with a degree of right heart dysfunction. There is no adenopathy in the abdomen pelvis. There is multilevel degenerative changes of the spine. No acute fracture. Grade 1 L2-L3 retrolisthesis. Median sternotomy wires. IMPRESSION: Emphysema.  No  focal consolidation or pneumothorax. Severe cardiomegaly with evidence of right cardiac dysfunction. No evidence of bowel obstruction. Apparent mild thickening of the duodenal wall, likely related to underdistention. Duodenitis is not excluded. Clinical correlation is recommended. Focal nodular thickening along the duodenal wall may represent a diverticulum. Other etiologies are not excluded. Endoscopy may provide better evaluation. Electronically Signed   By: Elgie Collard M.D.   On: 01/09/2015 02:04   Ct Angio Low Extrem Left W/cm &/or Wo/cm  01/07/2015  CLINICAL DATA:  Possible ischemic left foot. EXAM: CT ANGIOGRAPHY  LOWER LEFT EXTREMITY TECHNIQUE: Arterially timed CT angiography of the left lower extremity was performed from the level of the upper popliteal artery through the toes. This protocol was specifically requested. CONTRAST:  OMNIPAQUE IOHEXOL 350 MG/ML SOLN COMPARISON:  None. FINDINGS: Diffuse atheromatous wall thickening and calcification of the popliteal artery without flow limiting stenosis. Heavily diseased anterior tibial artery with underfilling and occlusion above the ankle. Chronic appearing occlusion or near occlusion of the tibioperoneal trunk with reconstituted flow to the under-filled and diffusely irregular posterior tibial artery from muscular branch, with continuation to the ankle. There is thready reconstitution of the peroneal artery, which likely accounts for the faintly opacified dorsalis pedis, reportedly detected by Doppler. No acute appearing arterial occlusion identified. There is secondary findings of inflow disease on the left: the study timing was triggered based on the left popliteal artery and there is asymmetric venous opacification of the right lower extremity (partially seen below the knee on reformats). No soft tissue gas or evidence of osteomyelitis. These results were called by telephone at the time of interpretation on 01/07/2015 at 9:20 pm to Dr. Gerhard Munch , who verbally acknowledged these results. Review of the MIP images confirms the above findings. IMPRESSION: 1. Extensive popliteal and infrapopliteal atherosclerosis with 2 vessel runoff at the ankle. 2. Secondary findings of left lower extremity arterial inflow disease, as above. Electronically Signed   By: Marnee Spring M.D.   On: 01/07/2015 21:34   Mr Maxine Glenn Head Wo Contrast  01/08/2015  CLINICAL DATA:  History of stroke. EXAM: MRA HEAD WITHOUT CONTRAST TECHNIQUE: Angiographic images of the Circle of Willis were obtained using MRA technique without intravenous contrast. COMPARISON:  CT head 01/07/2015 FINDINGS: Internal carotid artery is widely patent through the skullbase. There is diffuse atherosclerotic narrowing of the cavernous carotid bilaterally causing moderate to severe diffuse stenosis in both cavernous carotid, right greater than left. Supraclinoid internal carotid artery returns to normal caliber. Anterior and middle cerebral arteries normal in caliber without significant stenosis. Both vertebral arteries patent to the basilar. Left PICA widely patent. Right PICA not visualized. Basilar widely patent. Right AICA patent. Superior cerebellar and posterior cerebral arteries patent without stenosis. 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 Otherwise no significant intracranial stenosis. Electronically Signed   By: Marlan Palau M.D.   On: 01/08/2015 09:52   Ct Abdomen Pelvis W Contrast  01/09/2015  CLINICAL DATA:  69 year old female with dysphagia. EXAM: CT CHEST, ABDOMEN, AND PELVIS WITH CONTRAST TECHNIQUE: Multidetector CT imaging of the chest, abdomen and pelvis was performed following the standard protocol during bolus administration of intravenous contrast. CONTRAST:  80mL OMNIPAQUE IOHEXOL 300 MG/ML  SOLN COMPARISON:  None. FINDINGS: There is emphysematous changes of  the lungs. No focal consolidation, pleural effusion, or pneumothorax. The central airways are patent. There is atherosclerotic calcification of the thoracic aorta. No aneurysm or dissection. The central pulmonary arteries appear patent. There is severe cardiomegaly. Mechanical mitral and aortic valves. No pericardial effusion. A slightly enlarged left hilar lymph node measuring 1.5 cm in short axis. Mildly prominent subcarinal lymph nodes noted. The visualized esophagus is grossly unremarkable. The thyroid gland is not well visualized. There is no axillary adenopathy. The chest wall soft tissues appear unremarkable. No intra-abdominal free air or free fluid. There is minimal irregularity of the hepatic contour. The gallbladder is not visualized, likely surgically absent. Mild periportal edema. The pancreas, spleen, and adrenal glands appear  unremarkable. Bilateral renal cortical irregularity, likely related to recurrent infarct and scarring. A 1.7 cm right renal cyst. Subcentimeter left renal hypodense lesion is too small to characterize. There is no hydronephrosis on either side. The visualized ureters and urinary bladder appear unremarkable. Small uterine calcification may represent calcified fibroids. Ultrasound may provide better evaluation of the pelvic structures. There is mild apparent thickening of the proximal duodenum, likely related to underdistention. Duodenitis is less likely. Clinical correlation is recommended. There is a 1.8 cm nodular density along the wall of the proximal duodenum (coronal series 5, image 33 and axial series 2 image 76). This area of the duodenum is not evaluated due to is non opacification with oral contrast. This may represent a focal area of thickening or a duodenal diverticulum. Other etiologies are not excluded. Endoscopy may provide better evaluation if clinically indicated. There is sigmoid diverticulosis without active inflammation. Moderate stool noted throughout the  colon. No evidence of bowel obstruction. Appendectomy. Advanced aortoiliac atherosclerotic disease. The origins of the celiac axis, SMA, IMA as well as the origins of the renal arteries remain patent. No portal venous gas identified. There is retrograde flow of contrast into the IVC and hepatic veins compatible with a degree of right heart dysfunction. There is no adenopathy in the abdomen pelvis. There is multilevel degenerative changes of the spine. No acute fracture. Grade 1 L2-L3 retrolisthesis. Median sternotomy wires. IMPRESSION: Emphysema.  No focal consolidation or pneumothorax. Severe cardiomegaly with evidence of right cardiac dysfunction. No evidence of bowel obstruction. Apparent mild thickening of the duodenal wall, likely related to underdistention. Duodenitis is not excluded. Clinical correlation is recommended. Focal nodular thickening along the duodenal wall may represent a diverticulum. Other etiologies are not excluded. Endoscopy may provide better evaluation. Electronically Signed   By: Elgie Collard M.D.   On: 01/09/2015 02:04    Microbiology: Recent Results (from the past 240 hour(s))  MRSA PCR Screening     Status: None   Collection Time: 01/08/15 11:52 AM  Result Value Ref Range Status   MRSA by PCR NEGATIVE NEGATIVE Final    Comment:        The GeneXpert MRSA Assay (FDA approved for NASAL specimens only), is one component of a comprehensive MRSA colonization surveillance program. It is not intended to diagnose MRSA infection nor to guide or monitor treatment for MRSA infections.      Labs: Basic Metabolic Panel:  Recent Labs Lab 01/07/15 1638 01/07/15 1646 01/08/15 0228 01/09/15 0345 01/10/15 0640  NA 136 137 134* 136 137  K 3.8 3.8 4.0 3.8 3.7  CL 99* 96* 103 103 103  CO2 28  --  25 27 25   GLUCOSE 119* 114* 100* 97 105*  BUN 11 10 6 6 6   CREATININE 0.83 0.90 0.76 0.83 0.89  CALCIUM 9.3  --  8.6* 8.3* 8.8*   Liver Function Tests:  Recent  Labs Lab 01/07/15 1638 01/08/15 0228 01/09/15 0345 01/10/15 0640  AST 36 41 40 33  ALT 22 19 21 19   ALKPHOS 76 63 63 68  BILITOT 1.1 1.1 1.4* 1.9*  PROT 7.0 5.6* 6.1* 6.7  ALBUMIN 4.3 3.5 3.6 3.9   No results for input(s): LIPASE, AMYLASE in the last 168 hours. No results for input(s): AMMONIA in the last 168 hours. CBC:  Recent Labs Lab 01/07/15 1638  01/08/15 0228 01/09/15 0345 01/10/15 0640 01/11/15 0425 01/12/15 0425 01/13/15 0246  WBC 6.7  --  6.1 5.9 10.8* 11.1* 8.6 7.0  NEUTROABS 4.9  --  4.2 4.3 9.4*  --   --   --   HGB 13.4  < > 12.5 12.9 13.6 13.1 12.8 12.5  HCT 39.5  < > 37.3 39.0 41.9 40.6 38.9 37.8  MCV 96.1  --  96.6 97.5 97.9 98.1 98.5 98.2  PLT 210  --  187 205 215 208 212 222  < > = values in this interval not displayed. Cardiac Enzymes:  Recent Labs Lab 01/08/15 0228 01/08/15 0735 01/08/15 1332  TROPONINI 3.73* 4.43* 4.35*   BNP: BNP (last 3 results)  Recent Labs  06/07/14 1149  BNP 168.0*    ProBNP (last 3 results) No results for input(s): PROBNP in the last 8760 hours.  CBG:  Recent Labs Lab 01/07/15 1641  GLUCAP 123*       SignedCalvert Cantor:  Harmoney Sienkiewicz, MD [facility] 01/13/2015, 10:34 AM

## 2015-03-15 ENCOUNTER — Emergency Department (HOSPITAL_COMMUNITY)
Admission: EM | Admit: 2015-03-15 | Discharge: 2015-03-15 | Disposition: A | Discharge status: Home / Self Care | Payer: Medicare Other | Attending: Emergency Medicine | Admitting: Emergency Medicine

## 2015-03-15 ENCOUNTER — Encounter (HOSPITAL_COMMUNITY): Payer: Self-pay | Admitting: Emergency Medicine

## 2015-03-15 DIAGNOSIS — Z7901 Long term (current) use of anticoagulants: Secondary | ICD-10-CM | POA: Insufficient documentation

## 2015-03-15 DIAGNOSIS — Z87891 Personal history of nicotine dependence: Secondary | ICD-10-CM | POA: Diagnosis not present

## 2015-03-15 DIAGNOSIS — R630 Anorexia: Secondary | ICD-10-CM | POA: Diagnosis not present

## 2015-03-15 DIAGNOSIS — Z9889 Other specified postprocedural states: Secondary | ICD-10-CM | POA: Diagnosis not present

## 2015-03-15 DIAGNOSIS — Z7951 Long term (current) use of inhaled steroids: Secondary | ICD-10-CM | POA: Diagnosis not present

## 2015-03-15 DIAGNOSIS — E079 Disorder of thyroid, unspecified: Secondary | ICD-10-CM | POA: Insufficient documentation

## 2015-03-15 DIAGNOSIS — Z79899 Other long term (current) drug therapy: Secondary | ICD-10-CM | POA: Diagnosis not present

## 2015-03-15 DIAGNOSIS — I4891 Unspecified atrial fibrillation: Secondary | ICD-10-CM | POA: Insufficient documentation

## 2015-03-15 DIAGNOSIS — G8929 Other chronic pain: Secondary | ICD-10-CM | POA: Diagnosis not present

## 2015-03-15 DIAGNOSIS — E860 Dehydration: Secondary | ICD-10-CM | POA: Diagnosis not present

## 2015-03-15 DIAGNOSIS — F419 Anxiety disorder, unspecified: Secondary | ICD-10-CM | POA: Insufficient documentation

## 2015-03-15 DIAGNOSIS — R197 Diarrhea, unspecified: Secondary | ICD-10-CM | POA: Diagnosis present

## 2015-03-15 DIAGNOSIS — F1123 Opioid dependence with withdrawal: Secondary | ICD-10-CM | POA: Diagnosis not present

## 2015-03-15 DIAGNOSIS — E871 Hypo-osmolality and hyponatremia: Secondary | ICD-10-CM | POA: Insufficient documentation

## 2015-03-15 DIAGNOSIS — Z8673 Personal history of transient ischemic attack (TIA), and cerebral infarction without residual deficits: Secondary | ICD-10-CM | POA: Insufficient documentation

## 2015-03-15 DIAGNOSIS — R42 Dizziness and giddiness: Secondary | ICD-10-CM | POA: Diagnosis not present

## 2015-03-15 DIAGNOSIS — I10 Essential (primary) hypertension: Secondary | ICD-10-CM | POA: Insufficient documentation

## 2015-03-15 DIAGNOSIS — J449 Chronic obstructive pulmonary disease, unspecified: Secondary | ICD-10-CM | POA: Diagnosis not present

## 2015-03-15 LAB — CBC WITH DIFFERENTIAL/PLATELET
BASOS ABS: 0 10*3/uL (ref 0.0–0.1)
BASOS PCT: 0 %
EOS ABS: 0.1 10*3/uL (ref 0.0–0.7)
EOS PCT: 1 %
HEMATOCRIT: 37.7 % (ref 36.0–46.0)
Hemoglobin: 12.9 g/dL (ref 12.0–15.0)
Lymphocytes Relative: 17 %
Lymphs Abs: 1.1 10*3/uL (ref 0.7–4.0)
MCH: 32.2 pg (ref 26.0–34.0)
MCHC: 34.2 g/dL (ref 30.0–36.0)
MCV: 94 fL (ref 78.0–100.0)
MONO ABS: 0.5 10*3/uL (ref 0.1–1.0)
MONOS PCT: 8 %
NEUTROS ABS: 4.8 10*3/uL (ref 1.7–7.7)
Neutrophils Relative %: 74 %
PLATELETS: 233 10*3/uL (ref 150–400)
RBC: 4.01 MIL/uL (ref 3.87–5.11)
RDW: 13.2 % (ref 11.5–15.5)
WBC: 6.4 10*3/uL (ref 4.0–10.5)

## 2015-03-15 LAB — BASIC METABOLIC PANEL
ANION GAP: 9 (ref 5–15)
BUN: 9 mg/dL (ref 6–20)
CALCIUM: 8.7 mg/dL — AB (ref 8.9–10.3)
CO2: 26 mmol/L (ref 22–32)
CREATININE: 0.81 mg/dL (ref 0.44–1.00)
Chloride: 94 mmol/L — ABNORMAL LOW (ref 101–111)
GFR calc Af Amer: >60 mL/min (ref >60)
GLUCOSE: 100 mg/dL — AB (ref 65–99)
Potassium: 3.5 mmol/L (ref 3.5–5.1)
Sodium: 129 mmol/L — ABNORMAL LOW (ref 135–145)

## 2015-03-15 LAB — PROTIME-INR
INR: 3.79 — AB (ref 0.00–1.49)
PROTHROMBIN TIME: 36.5 s — AB (ref 11.6–15.2)

## 2015-03-15 LAB — DIGOXIN LEVEL: Digoxin Level: 1.2 ng/mL (ref 0.8–2.0)

## 2015-03-15 LAB — POC OCCULT BLOOD, ED: FECAL OCCULT BLD: NEGATIVE

## 2015-03-15 MED ORDER — SODIUM CHLORIDE 0.9 % IV BOLUS (SEPSIS)
2000.0000 mL | Freq: Once | INTRAVENOUS | Status: AC
  Administered 2015-03-15: 2000 mL via INTRAVENOUS

## 2015-03-15 MED ORDER — SODIUM CHLORIDE 0.9 % IV BOLUS (SEPSIS)
1000.0000 mL | Freq: Once | INTRAVENOUS | Status: AC
  Administered 2015-03-15: 1000 mL via INTRAVENOUS

## 2015-03-15 NOTE — ED Provider Notes (Signed)
CSN: [id number]     Arrival date & time 03/15/15  1440 History   First MD Initiated Contact with Patient 03/15/15 1527     Chief Complaint  Patient presents with  . Withdrawal     (Consider location/radiation/quality/duration/timing/severity/associated sxs/prior Treatment) HPI Patient feels as if she may be withdrawing from opioids. She last used Norco 9 days ago she feels generally weak. She had diarrhea until yesterday. She reports having had a black bowel movement yesterday, normal bowel movement today. Other associated symptoms include feeling mildly anxious and lightheaded denies abdominal pain. No other associated symptoms Past Medical History  Diagnosis Date  . Brain aneurysm 10/20/13    3 on lt side and 1 on the right, most recent Oct 1  . Thyroid disease   . Hypertension   . COPD (chronic obstructive pulmonary disease) (HCC)   . Asthma   . Chronic back pain   . Sciatica   . Hyponatremia   . Stroke [facility])     seen on CT scan in 2015  . Episodic lightheadedness     since cerebral aneurysm repair 10/2013  . Atrial fibrillation (HCC)   . S/P mitral valve replacement with metallic valve 01/08/2015  . S/P aortic valve replacement with metallic valve 01/09/2015    [facility] 1995  . 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 Left coronary system traverses lateral to pulmonary artery.    Past Surgical History  Procedure Laterality Date  . Brain surgery      aneurysm stent placed 2015  . Cardiac surgery    . Cholecystectomy    . Appendectomy    . Cataract extraction    . Cardiac catheterization N/A 01/09/2015    Procedure: Left Heart Cath and Coronary Angiography;  Surgeon: Lennette Bihari, MD;  Location: [facility] INVASIVE CV LAB;  Service: Cardiovascular;  Laterality: N/A;   Family History  Problem Relation Age of Onset  . Cancer Sister   . Cancer Brother   . Kidney failure Father    Social History  Substance Use  Topics  . Smoking status: Former Smoker -- 1.00 packs/day for 40 years    Types: Cigarettes    Quit date: 01/07/2015  . Smokeless tobacco: Never Used  . Alcohol Use: No   OB History    No data available     Review of Systems  Constitutional: Positive for appetite change.       Diminished Appetite over the past week, feels dehydrated  Gastrointestinal: Positive for diarrhea.  Neurological: Positive for light-headedness.  Psychiatric/Behavioral:       Anxiety  All other systems reviewed and are negative.     Allergies  Sulfa antibiotics  Home Medications   Prior to Admission medications   Medication Sig Start Date End Date Taking? Authorizing Provider  albuterol (PROVENTIL) (2.5 MG/3ML) 0.083% nebulizer solution Take 2.5 mg by nebulization at bedtime.     Historical Provider, MD  albuterol (VENTOLIN HFA) 108 (90 BASE) MCG/ACT inhaler Inhale 2 puffs into the lungs 4 (four) times daily.    Historical Provider, MD  ALPRAZolam Prudy Feeler) 1 MG tablet Take 1 mg by mouth 2 (two) times daily as needed for anxiety.    Historical Provider, MD  COUMADIN 5 MG tablet Take 5-7.5 mg by mouth at bedtime. Patient takes 5 mg everyday besides on Monday when patient takes 7.5 mg 05/12/14   Historical Provider, MD  digoxin (LANOXIN) 0.25 MG  tablet Take 0.25 mg by mouth every morning.    Historical Provider, MD  docusate sodium (COLACE) 100 MG capsule Take 1 capsule (100 mg total) by mouth 2 (two) times daily. 01/13/15   Calvert Cantor, MD  fluticasone (FLONASE) 50 MCG/ACT nasal spray Place 2 sprays into both nostrils every evening.     Historical Provider, MD  furosemide (LASIX) 20 MG tablet Take 20 mg by mouth as needed for fluid.    Historical Provider, MD  furosemide (LASIX) 40 MG tablet Take 1 tablet (40 mg total) by mouth daily. 06/07/14   Benjiman Core, MD  guaiFENesin (MUCINEX) 600 MG 12 hr tablet Take 600 mg by mouth 2 (two) times daily.    Historical Provider, MD  ipratropium-albuterol (DUONEB)  0.5-2.5 (3) MG/3ML SOLN Take 3 mLs by nebulization every 6 (six) hours as needed. 01/13/15   Calvert Cantor, MD  levothyroxine (SYNTHROID, LEVOTHROID) 50 MCG tablet Take 50 mcg by mouth at bedtime.     Historical Provider, MD  losartan (COZAAR) 50 MG tablet Take 50 mg by mouth every evening.    Historical Provider, MD  metoprolol succinate (TOPROL-XL) 25 MG 24 hr tablet Take 1 tablet (25 mg total) by mouth daily. 01/13/15   Calvert Cantor, MD  montelukast (SINGULAIR) 10 MG tablet Take 10 mg by mouth every morning.    Historical Provider, MD  nicotine (NICODERM CQ - DOSED IN MG/24 HOURS) 21 mg/24hr patch Place 1 patch (21 mg total) onto the skin daily. 01/13/15   Calvert Cantor, MD  nicotine (NICODERM CQ) 14 mg/24hr patch Place 1 patch (14 mg total) onto the skin daily. Start when 21 mg patches have finised 01/13/15   Calvert Cantor, MD  nicotine (NICODERM CQ) 7 mg/24hr patch Place 1 patch (7 mg total) onto the skin daily. Start when 14 mg patches have finished 01/13/15   Calvert Cantor, MD  oxymetazoline (AFRIN) 0.05 % nasal spray Place 1 spray into both nostrils as needed for congestion.     Historical Provider, MD  promethazine (PHENERGAN) 12.5 MG tablet Take 12.5 mg by mouth every 6 (six) hours as needed for nausea or vomiting.    Historical Provider, MD  sucralfate (CARAFATE) 1 G tablet Take 1 tablet (1 g total) by mouth 4 (four) times daily. 05/13/14   Rolland Porter, MD  Umeclidinium Bromide (INCRUSE ELLIPTA) 62.5 MCG/INH AEPB Inhale 1 puff into the lungs at bedtime.    Historical Provider, MD   BP 153/66 mmHg  Pulse 81  Temp(Src) 97.6 F (36.4 C) (Oral)  Resp 18  Ht 5\' 7"  (1.702 m)  Wt 134 lb (60.782 kg)  BMI 20.98 kg/m2 Physical Exam  Constitutional: She is oriented to person, place, and time. She appears well-developed and well-nourished.  HENT:  Head: Normocephalic and atraumatic.  Mucous membranes dry  Eyes: Conjunctivae are normal. Pupils are equal, round, and reactive to light.  Neck: Neck  supple. No tracheal deviation present. No thyromegaly present.  Cardiovascular: Normal rate and regular rhythm.   Pulmonary/Chest: Effort normal and breath sounds normal.  Abdominal: Soft. Bowel sounds are normal. She exhibits no distension. There is no tenderness.  Genitourinary: Guaiac negative stool.  Normal tone, minimal stool on examining finger  Musculoskeletal: Normal range of motion. She exhibits no edema or tenderness.  Neurological: She is alert and oriented to person, place, and time. Coordination normal.  Gait normal mildly lightheaded on standing  Skin: Skin is warm and dry. No rash noted.  Psychiatric: She has a normal mood  and affect.  Nursing note and vitals reviewed.   ED Course  Procedures (including critical care time) Labs Review Labs Reviewed - No data to display  Imaging Review No results found. I have personally reviewed and evaluated these images and lab results as part of my medical decision-making.   EKG Interpretation None     5:40 PM patient remains mildly lightheaded on standing.  7:45 PM she feels improved radial home. No longer lightheaded on standing. She ate part of a meal while here. Results for orders placed or performed during the hospital encounter of 03/15/15  Basic metabolic panel  Result Value Ref Range   Sodium 129 (L) 135 - 145 mmol/L   Potassium 3.5 3.5 - 5.1 mmol/L   Chloride 94 (L) 101 - 111 mmol/L   CO2 26 22 - 32 mmol/L   Glucose, Bld 100 (H) 65 - 99 mg/dL   BUN 9 6 - 20 mg/dL   Creatinine, Ser 5.40 0.44 - 1.00 mg/dL   Calcium 8.7 (L) 8.9 - 10.3 mg/dL   GFR calc non Af Amer >60 >60 mL/min   GFR calc Af Amer >60 >60 mL/min   Anion gap 9 5 - 15  CBC with Differential/Platelet  Result Value Ref Range   WBC 6.4 4.0 - 10.5 K/uL   RBC 4.01 3.87 - 5.11 MIL/uL   Hemoglobin 12.9 12.0 - 15.0 g/dL   HCT 98.1 19.1 - 47.8 %   MCV 94.0 78.0 - 100.0 fL   MCH 32.2 26.0 - 34.0 pg   MCHC 34.2 30.0 - 36.0 g/dL   RDW 29.5 62.1 - 30.8 %    Platelets 233 150 - 400 K/uL   Neutrophils Relative % 74 %   Neutro Abs 4.8 1.7 - 7.7 K/uL   Lymphocytes Relative 17 %   Lymphs Abs 1.1 0.7 - 4.0 K/uL   Monocytes Relative 8 %   Monocytes Absolute 0.5 0.1 - 1.0 K/uL   Eosinophils Relative 1 %   Eosinophils Absolute 0.1 0.0 - 0.7 K/uL   Basophils Relative 0 %   Basophils Absolute 0.0 0.0 - 0.1 K/uL  Protime-INR  Result Value Ref Range   Prothrombin Time 36.5 (H) 11.6 - 15.2 seconds   INR 3.79 (H) 0.00 - 1.49  Digoxin level  Result Value Ref Range   Digoxin Level 1.2 0.8 - 2.0 ng/mL  POC occult blood, ED  Result Value Ref Range   Fecal Occult Bld NEGATIVE NEGATIVE   No results found.  MDM  Encourage oral hydration. Stop Lasix or 1 week. Follow-up with PMD area patient reports that her INR was 4 last week, it has come down since her Coumadin dose was adjusted. Diagnosis #1 dehydration #2 hyponatremia Final diagnoses:  None        Doug Sou, MD 03/15/15 1950

## 2015-03-15 NOTE — ED Notes (Signed)
Pt reports taking lortab for 2 years and trying to withdrawal from this medication by herself.  Pt reports dizziness when standing up.  Pt reports that once she ran out, she was buying them on the street. Pt had diarrhea x2 but had normal bm today.  Pt last use was last Tuesday. Pt denies si/hi.

## 2015-03-15 NOTE — Discharge Instructions (Signed)
Dehydration, Adult Discontinue Lasix (furosemide) for one week, as Lasix can cause further dehydration. Drink at least six eight onunce last visit of Gatorade each day. Contact your primary care physician arrange to be seen in the office next week. Your blood sodium was slightly low at 129. It should be rechecked in his office. Call any of the numbers on the resource guide if you need help with stopping Norco or feel that you may need counseling. Dehydration is a condition in which you do not have enough fluid or water in your body. It happens when you take in less fluid than you lose. Vital organs such as the kidneys, brain, and heart cannot function without a proper amount of fluids. Any loss of fluids from the body can cause dehydration.  Dehydration can range from mild to severe. This condition should be treated right away to help prevent it from becoming severe. CAUSES  This condition may be caused by:  Vomiting.  Diarrhea.  Excessive sweating, such as when exercising in hot or humid weather.  Not drinking enough fluid during strenuous exercise or during an illness.  Excessive urine output.  Fever.  Certain medicines. RISK FACTORS This condition is more likely to develop in:  People who are taking certain medicines that cause the body to lose excess fluid (diuretics).   People who have a chronic illness, such as diabetes, that may increase urination.  Older adults.   People who live at high altitudes.   People who participate in endurance sports.  SYMPTOMS  Mild Dehydration  Thirst.  Dry lips.  Slightly dry mouth.  Dry, warm skin. Moderate Dehydration  Very dry mouth.   Muscle cramps.   Dark urine and decreased urine production.   Decreased tear production.   Headache.   Light-headedness, especially when you stand up from a sitting position.  Severe Dehydration  Changes in skin.   Cold and clammy skin.   Skin does not spring back quickly  when lightly pinched and released.   Changes in body fluids.   Extreme thirst.   No tears.   Not able to sweat when body temperature is high, such as in hot weather.   Minimal urine production.   Changes in vital signs.   Rapid, weak pulse (more than 100 beats per minute when you are sitting still).   Rapid breathing.   Low blood pressure.   Other changes.   Sunken eyes.   Cold hands and feet.   Confusion.  Lethargy and difficulty being awakened.  Fainting (syncope).   Short-term weight loss.   Unconsciousness. DIAGNOSIS  This condition may be diagnosed based on your symptoms. You may also have tests to determine how severe your dehydration is. These tests may include:   Urine tests.   Blood tests.  TREATMENT  Treatment for this condition depends on the severity. Mild or moderate dehydration can often be treated at home. Treatment should be started right away. Do not wait until dehydration becomes severe. Severe dehydration needs to be treated at the hospital. Treatment for Mild Dehydration  Drinking plenty of water to replace the fluid you have lost.   Replacing minerals in your blood (electrolytes) that you may have lost.  Treatment for Moderate Dehydration  Consuming oral rehydration solution (ORS). Treatment for Severe Dehydration  Receiving fluid through an IV tube.   Receiving electrolyte solution through a feeding tube that is passed through your nose and into your stomach (nasogastric tube or NG tube).  Correcting any abnormalities  in electrolytes. HOME CARE INSTRUCTIONS   Drink enough fluid to keep your urine clear or pale yellow.   Drink water or fluid slowly by taking small sips. You can also try sucking on ice cubes.  Have food or beverages that contain electrolytes. Examples include bananas and sports drinks.  Take over-the-counter and prescription medicines only as told by your health care provider.   Prepare  ORS according to the manufacturer's instructions. Take sips of ORS every 5 minutes until your urine returns to normal.  If you have vomiting or diarrhea, continue to try to drink water, ORS, or both.   If you have diarrhea, avoid:   Beverages that contain caffeine.   Fruit juice.   Milk.   Carbonated soft drinks.  Do not take salt tablets. This can lead to the condition of having too much sodium in your body (hypernatremia).  SEEK MEDICAL CARE IF:  You cannot eat or drink without vomiting.  You have had moderate diarrhea during a period of more than 24 hours.  You have a fever. SEEK IMMEDIATE MEDICAL CARE IF:   You have extreme thirst.  You have severe diarrhea.  You have not urinated in 6-8 hours, or you have urinated only a small amount of very dark urine.  You have shriveled skin.  You are dizzy, confused, or both.   This information is not intended to replace advice given to you by your health care provider. Make sure you discuss any questions you have with your health care provider.   Document Released: 01/06/2005 Document Revised: 09/27/2014 Document Reviewed: 05/24/2014 Elsevier Interactive Patient Education 2016 [employer].  Emergency Department Resource Guide 1) Find a Doctor and Pay Out of Pocket Although you won't have to find out who is covered by your insurance plan, it is a good idea to ask around and get recommendations. You will then need to call the office and see if the doctor you have chosen will accept you as a new patient and what types of options they offer for patients who are self-pay. Some doctors offer discounts or will set up payment plans for their patients who do not have insurance, but you will need to ask so you aren't surprised when you get to your appointment.  2) Contact Your Local Health Department Not all health departments have doctors that can see patients for sick visits, but many do, so it is worth a call to see if yours  does. If you don't know where your local health department is, you can check in your phone book. The CDC also has a tool to help you locate your state's health department, and many state websites also have listings of all of their local health departments.  3) Find a Walk-in Clinic If your illness is not likely to be very severe or complicated, you may want to try a walk in clinic. These are popping up all over the country in pharmacies, drugstores, and shopping centers. They're usually staffed by nurse practitioners or physician assistants that have been trained to treat common illnesses and complaints. They're usually fairly quick and inexpensive. However, if you have serious medical issues or chronic medical problems, these are probably not your best option.  No Primary Care Doctor: - Call Health Connect at  [phone] - they can help you locate a primary care doctor that  accepts your insurance, provides certain services, etc. - Physician Referral Service- [phone]  Chronic Pain Problems: Organization  Address  Phone   Notes  [facility]  [phone] Patients need to be referred by their primary care doctor.   Medication Assistance: Organization         Address  Phone   Notes  [facility] Medication [facility] [address] [place] [place], [place] [address] [phone] --Must be a resident of [facility] -- Must have NO insurance coverage whatsoever (no Medicaid/ Medicare, etc.) -- The pt. MUST have a primary care doctor that directs their care regularly and follows them in the community   [facility]ist  [phone]   [employer]  [phone]    Agencies that provide inexpensive medical care: Organization         Address  Phone   Notes  [facility]  [phone]   [facility]    [phone]   [facility] [address] [place], [place] [address] [phone]    [facility] [address] [place] [phone]   Planned Parenthood    [phone]   [facility]    [phone]   [facility]  [address], [place] Phone:  [phone], Fax:  [phone] Hours of Operation:  9 am - 6 pm, M-F.  Also accepts Medicaid/Medicare and self-pay.  [facility]  [address], [place] Phone: [phone], Fax: [phone]. Hours of Operation:  8:30 am - 5:30 pm, M-F.  Also accepts Medicaid and self-pay.  [facility] [place] [address] [place] Phone: [phone]   [facility] [address] Natasha Bence [place], [place] [phone], Ext. [phone] Mondays & Thursdays: 7-9 AM.  First 15 patients are seen on a first come, first serve basis.    Medicaid-accepting [facility] Providers:  Organization         Address  Phone   Notes  [facility] [address], [place] [phone] Also accepts self-pay patients.  [facility] [address] Laurell Josephs [place], [place]  [phone]   [facility] [address], [place] [phone]   [facility] [address] [place] [phone]   Renaye Rakers [address], [place]   [phone] Only accepts [place] Access [place] patients after they have their name applied to their card.   Self-Pay (no insurance) in [facility]:  Organization         Address  Phone   Notes  Sickle Cell Patients, [facility] [address] [place], [place] [phone]   [facility] [address] [place], [place] [phone]   [facility] [place]  [address], [place] [phone]   [facility]/Dr. Osei-Bonsu  [address], [place] or [address], [place] [phone] Phone number for both [place] and [place] locations is the same.  [facility] [address], [place] [phone]   [facility] [address], [place] or [address] [phone]   [facility] [address] [place] [phone], phone; [phone], fax Sees patients 1st and 3rd Saturday of every month.  Must not qualify  for public or private insurance (i.e. Medicaid, Medicare, [place] Health Choice, Veterans' Benefits)  Household income should be no more than 200% of the poverty level The clinic cannot treat you if you are pregnant or think you are pregnant  Sexually transmitted diseases are not treated at the clinic.    Dental Care: Organization         Address  Phone  Notes  [facility] Department of [facility] [address] [place], [place] [phone] Accepts children up to age 87 who are enrolled in [place] or [place] Health Choice; pregnant women with a Medicaid card; and children who have applied for Medicaid or [place] Health Choice, but were declined, whose parents can pay a reduced fee at time of service.  [facility] of [facility]  [address], [place] [phone] Accepts children up to age 38 who are enrolled in [place] or [place] Health Choice; pregnant women with a Medicaid card; and children who have applied for Medicaid or [place] Health Choice, but were declined, whose parents can pay a reduced fee at time of service.  [facility]  [address] [place], [place] [phone] Patients are seen by appointment only. Walk-ins are not accepted. [facility] will see patients 28 years of age and older. Monday - Tuesday (8am-5pm) Most Wednesdays (8:30-5pm) $30 per visit, cash only  [facility]  [address], [facility] [phone] Patients are seen by  appointment only. Walk-ins are not accepted. [facility] will see patients 75 years of age and older. One Wednesday Evening (Monthly: Volunteer Based).  $30 per visit, cash only  [employer]  [phone] for adults; Children under age 66, call Graduate Pediatric Dentistry at [phone]. Children aged 48-14, please call [phone] to request a pediatric application.  Dental services are provided in all areas of dental care including fillings, crowns and bridges, complete and partial dentures, implants, gum treatment, root canals, and extractions. Preventive care is also provided. Treatment is provided to both adults and children. Patients are selected via a lottery and there is often a waiting list.   [facility] [address], [place]  [phone] [email or web address]   [facility] [address] [place], [place] [phone], Ext. [phone] Second and Fourth Thursday of each month, opens at 6:30 AM; Clinic ends at 9 AM.  Patients are seen on a first-come first-served basis, and a limited number are seen during each clinic.   [facility]  [address] Ether Griffins [place], [place] [phone]   Eligibility Requirements You must have lived in [place], [place], or [place] counties for at least the last three months.   You cannot be eligible for state or federal sponsored [place], including [employer], [place], or [employer].   You generally cannot be eligible for healthcare insurance through your employer.    How to apply: Eligibility screenings are held every Tuesday and Wednesday afternoon from 1:00 pm until 4:00 pm. You do not need an appointment for the interview!  [facility] [address] [place], [place] [phone]   [facility]  [phone]   [facility]  [phone]   [facility]  [phone]     Behavioral Health Resources in the Community: Intensive Outpatient Programs Organization         Address  Phone  Notes  [facility] [address] [place], [place] [phone]   [facility] Outpatient [address], [place], [place] [phone]   [facility] [address], [place], [place]  [phone]   [facility] Mental Health [address],  [place], [place] [phone] or [phone]   Substance Abuse Resources Organization         Address  Phone  Notes  [facility]  [phone]   [facility]  [phone]   [facility]  [phone]   Floydene Flock  [phone]   Residential & Outpatient Substance Abuse Program  [phone]   Psychological Services Organization         Address  Phone  Notes  [facility]  [phone]   [facility]  [phone]   [facility] [address] [place] [phone] or [phone]    Mobile Crisis Teams Organization         Address  Phone  Notes  [facility], Mobile Crisis Care Unit  [phone]   [facility]  [address] [place], [place] [phone]   Doristine Locks [address] [place] [phone]    Self-Help/Support Groups Organization         Address  Phone             Notes  [facility] [place] - variety of support groups  336- I7437963 Call for more information  Narcotics Anonymous (NA), [facility] [address], [employer] [place]  2 meetings at this location   Statistician         Address  Phone  Notes  [facility] [address],    [place]  [phone]   [facility]  [address] [place] [address], [place], [place] [phone]   [facility] [address] [place], [place] [phone] Admissions: 8am-3pm M-F  Incentives  [facility] [address],    [place], [place] [phone]   [facility] [address] [place], [place], [place] [phone]   [facility] [address],  [place], [place] [phone]   [facility] - Intensive Outpatient [address] Laurell Josephs [address], [place], [place] [phone]   [facility] ([facility] [address] [place]  [place], [place] [phone] or [phone]   [facility] ([facility]) [address], [place], [place] [phone] Accepts Medicaid  [facility] [address],  [place] [phone] Substance Abuse/Addiction Treatment   [facility] Organization         Address  Phone  Notes  [facility]  [phone]   Angie Fava, PhD [address] Ervin Knack [place], [place]   [phone] or [phone]   [facility]   [address] [place], [place] [phone]   [facility] [address], [place], [place] [phone] Insurance/Medicaid/sponsorship through [facility] [address]                                    [place], [place] [phone] Therapy/tele-psych/case  [facility] [address], [place] [phone]    Dr. Lolly Mustache  [phone]   [facility] [place]  [facility]. 1) [address], [place] 2) [place] 3)  [place] [address], [place] [phone]  [phone]   [place] [phone] or [phone] (After Hours)

## 2015-03-30 ENCOUNTER — Encounter (HOSPITAL_COMMUNITY): Payer: Self-pay

## 2015-03-30 ENCOUNTER — Inpatient Hospital Stay (HOSPITAL_COMMUNITY)
Admission: AD | Admit: 2015-03-30 | Discharge: 2015-04-04 | DRG: 885 | Disposition: A | Discharge status: Home / Self Care | Payer: Medicare Other | Attending: Psychiatry | Admitting: Psychiatry

## 2015-03-30 DIAGNOSIS — J449 Chronic obstructive pulmonary disease, unspecified: Secondary | ICD-10-CM | POA: Diagnosis present

## 2015-03-30 DIAGNOSIS — F332 Major depressive disorder, recurrent severe without psychotic features: Secondary | ICD-10-CM | POA: Diagnosis present

## 2015-03-30 DIAGNOSIS — G4700 Insomnia, unspecified: Secondary | ICD-10-CM | POA: Diagnosis present

## 2015-03-30 DIAGNOSIS — Z8673 Personal history of transient ischemic attack (TIA), and cerebral infarction without residual deficits: Secondary | ICD-10-CM | POA: Diagnosis not present

## 2015-03-30 DIAGNOSIS — R45851 Suicidal ideations: Secondary | ICD-10-CM | POA: Diagnosis present

## 2015-03-30 DIAGNOSIS — Z7901 Long term (current) use of anticoagulants: Secondary | ICD-10-CM | POA: Diagnosis not present

## 2015-03-30 DIAGNOSIS — Z809 Family history of malignant neoplasm, unspecified: Secondary | ICD-10-CM | POA: Diagnosis not present

## 2015-03-30 DIAGNOSIS — Z79899 Other long term (current) drug therapy: Secondary | ICD-10-CM | POA: Diagnosis not present

## 2015-03-30 DIAGNOSIS — G8929 Other chronic pain: Secondary | ICD-10-CM | POA: Diagnosis present

## 2015-03-30 DIAGNOSIS — I4891 Unspecified atrial fibrillation: Secondary | ICD-10-CM | POA: Diagnosis present

## 2015-03-30 DIAGNOSIS — J45909 Unspecified asthma, uncomplicated: Secondary | ICD-10-CM | POA: Diagnosis present

## 2015-03-30 DIAGNOSIS — Z952 Presence of prosthetic heart valve: Secondary | ICD-10-CM

## 2015-03-30 DIAGNOSIS — K219 Gastro-esophageal reflux disease without esophagitis: Secondary | ICD-10-CM | POA: Diagnosis present

## 2015-03-30 DIAGNOSIS — I10 Essential (primary) hypertension: Secondary | ICD-10-CM | POA: Diagnosis present

## 2015-03-30 DIAGNOSIS — F411 Generalized anxiety disorder: Secondary | ICD-10-CM | POA: Diagnosis present

## 2015-03-30 DIAGNOSIS — Z87891 Personal history of nicotine dependence: Secondary | ICD-10-CM | POA: Diagnosis not present

## 2015-03-30 DIAGNOSIS — F1320 Sedative, hypnotic or anxiolytic dependence, uncomplicated: Secondary | ICD-10-CM | POA: Diagnosis present

## 2015-03-30 DIAGNOSIS — E039 Hypothyroidism, unspecified: Secondary | ICD-10-CM | POA: Diagnosis present

## 2015-03-30 DIAGNOSIS — F410 Panic disorder [episodic paroxysmal anxiety] without agoraphobia: Secondary | ICD-10-CM | POA: Diagnosis present

## 2015-03-30 DIAGNOSIS — Z825 Family history of asthma and other chronic lower respiratory diseases: Secondary | ICD-10-CM | POA: Diagnosis not present

## 2015-03-30 DIAGNOSIS — F1120 Opioid dependence, uncomplicated: Secondary | ICD-10-CM | POA: Diagnosis present

## 2015-03-30 DIAGNOSIS — I252 Old myocardial infarction: Secondary | ICD-10-CM | POA: Diagnosis not present

## 2015-03-30 DIAGNOSIS — Z841 Family history of disorders of kidney and ureter: Secondary | ICD-10-CM | POA: Diagnosis not present

## 2015-03-30 LAB — COMPREHENSIVE METABOLIC PANEL
ALK PHOS: 72 U/L (ref 38–126)
ALT: 20 U/L (ref 14–54)
AST: 36 U/L (ref 15–41)
Albumin: 5.2 g/dL — ABNORMAL HIGH (ref 3.5–5.0)
Anion gap: 12 (ref 5–15)
BILIRUBIN TOTAL: 2.2 mg/dL — AB (ref 0.3–1.2)
BUN: 13 mg/dL (ref 6–20)
CALCIUM: 9.5 mg/dL (ref 8.9–10.3)
CHLORIDE: 88 mmol/L — AB (ref 101–111)
CO2: 25 mmol/L (ref 22–32)
CREATININE: 0.83 mg/dL (ref 0.44–1.00)
Glucose, Bld: 160 mg/dL — ABNORMAL HIGH (ref 65–99)
Potassium: 3.9 mmol/L (ref 3.5–5.1)
Sodium: 125 mmol/L — ABNORMAL LOW (ref 135–145)
Total Protein: 8.1 g/dL (ref 6.5–8.1)

## 2015-03-30 LAB — TSH: TSH: 0.98 u[IU]/mL (ref 0.350–4.500)

## 2015-03-30 LAB — ETHANOL

## 2015-03-30 LAB — PROTIME-INR
INR: 1.9 — ABNORMAL HIGH (ref 0.00–1.49)
Prothrombin Time: 21 seconds — ABNORMAL HIGH (ref 11.6–15.2)

## 2015-03-30 LAB — DIGOXIN LEVEL: DIGOXIN LVL: 0.8 ng/mL (ref 0.8–2.0)

## 2015-03-30 MED ORDER — SERTRALINE HCL 25 MG PO TABS
25.0000 mg | ORAL_TABLET | Freq: Every day | ORAL | Status: DC
  Administered 2015-03-30 – 2015-04-02 (×4): 25 mg via ORAL
  Filled 2015-03-30 (×6): qty 1

## 2015-03-30 MED ORDER — LORAZEPAM 1 MG PO TABS
1.0000 mg | ORAL_TABLET | Freq: Four times a day (QID) | ORAL | Status: AC
Start: 2015-03-30 — End: 2015-03-31
  Administered 2015-03-30 – 2015-03-31 (×4): 1 mg via ORAL
  Filled 2015-03-30 (×4): qty 1

## 2015-03-30 MED ORDER — ALBUTEROL SULFATE HFA 108 (90 BASE) MCG/ACT IN AERS: 1.0000 | INHALATION_SPRAY | Freq: Four times a day (QID) | RESPIRATORY_TRACT | Status: DC | PRN

## 2015-03-30 MED ORDER — LEVOTHYROXINE SODIUM 50 MCG PO TABS
50.0000 ug | ORAL_TABLET | Freq: Every day | ORAL | Status: DC
  Administered 2015-03-31 – 2015-04-04 (×5): 50 ug via ORAL
  Filled 2015-03-30 (×7): qty 1

## 2015-03-30 MED ORDER — UMECLIDINIUM BROMIDE 62.5 MCG/INH IN AEPB
1.0000 | INHALATION_SPRAY | Freq: Every day | RESPIRATORY_TRACT | Status: DC
  Administered 2015-04-01 – 2015-04-04 (×4): 1 via RESPIRATORY_TRACT
  Filled 2015-03-30: qty 7

## 2015-03-30 MED ORDER — FLUTICASONE PROPIONATE 50 MCG/ACT NA SUSP
1.0000 | Freq: Every day | NASAL | Status: DC
  Administered 2015-03-31 – 2015-04-04 (×5): 1 via NASAL
  Filled 2015-03-30 (×2): qty 16

## 2015-03-30 MED ORDER — METOPROLOL TARTRATE 12.5 MG HALF TABLET
12.5000 mg | ORAL_TABLET | Freq: Two times a day (BID) | ORAL | Status: DC
  Administered 2015-03-30 – 2015-04-04 (×10): 12.5 mg via ORAL
  Filled 2015-03-30 (×14): qty 1

## 2015-03-30 MED ORDER — TRAZODONE HCL 50 MG PO TABS: 50.0000 mg | ORAL_TABLET | Freq: Every evening | ORAL | Status: DC | PRN

## 2015-03-30 MED ORDER — ACETAMINOPHEN 325 MG PO TABS: 650.0000 mg | ORAL_TABLET | Freq: Four times a day (QID) | ORAL | Status: DC | PRN

## 2015-03-30 MED ORDER — WARFARIN - PHARMACIST DOSING INPATIENT
Freq: Every day | Status: DC
  Filled 2015-03-30 (×9): qty 1

## 2015-03-30 MED ORDER — LOSARTAN POTASSIUM 50 MG PO TABS
50.0000 mg | ORAL_TABLET | Freq: Every day | ORAL | Status: DC
  Administered 2015-03-31 – 2015-04-04 (×5): 50 mg via ORAL
  Filled 2015-03-30 (×8): qty 1

## 2015-03-30 MED ORDER — HYDROXYZINE HCL 25 MG PO TABS
25.0000 mg | ORAL_TABLET | Freq: Four times a day (QID) | ORAL | Status: AC | PRN
  Administered 2015-03-31 (×2): 25 mg via ORAL
  Filled 2015-03-30 (×2): qty 1

## 2015-03-30 MED ORDER — LORAZEPAM 1 MG PO TABS
1.0000 mg | ORAL_TABLET | Freq: Two times a day (BID) | ORAL | Status: AC
  Administered 2015-04-02 (×2): 1 mg via ORAL
  Filled 2015-03-30 (×2): qty 1

## 2015-03-30 MED ORDER — ALUM & MAG HYDROXIDE-SIMETH 200-200-20 MG/5ML PO SUSP
30.0000 mL | ORAL | Status: DC | PRN
  Administered 2015-04-01 – 2015-04-02 (×2): 30 mL via ORAL
  Filled 2015-03-30 (×2): qty 30

## 2015-03-30 MED ORDER — NICOTINE 14 MG/24HR TD PT24
14.0000 mg | MEDICATED_PATCH | Freq: Every day | TRANSDERMAL | Status: DC
  Administered 2015-03-30 – 2015-04-04 (×6): 14 mg via TRANSDERMAL
  Filled 2015-03-30 (×9): qty 1

## 2015-03-30 MED ORDER — DIGOXIN 250 MCG PO TABS
0.2500 mg | ORAL_TABLET | Freq: Every day | ORAL | Status: DC
  Filled 2015-03-30 (×2): qty 1

## 2015-03-30 MED ORDER — LORAZEPAM 1 MG PO TABS
1.0000 mg | ORAL_TABLET | Freq: Every day | ORAL | Status: AC
  Administered 2015-04-03: 1 mg via ORAL
  Filled 2015-03-30 (×2): qty 1

## 2015-03-30 MED ORDER — LORAZEPAM 1 MG PO TABS
1.0000 mg | ORAL_TABLET | Freq: Three times a day (TID) | ORAL | Status: AC
  Administered 2015-03-31 – 2015-04-01 (×3): 1 mg via ORAL
  Filled 2015-03-30 (×3): qty 1

## 2015-03-30 MED ORDER — LORAZEPAM 1 MG PO TABS
1.0000 mg | ORAL_TABLET | Freq: Four times a day (QID) | ORAL | Status: DC | PRN
  Administered 2015-03-30 – 2015-04-02 (×4): 1 mg via ORAL
  Filled 2015-03-30 (×5): qty 1

## 2015-03-30 MED ORDER — ADULT MULTIVITAMIN W/MINERALS CH
1.0000 | ORAL_TABLET | Freq: Every day | ORAL | Status: DC
  Administered 2015-03-30 – 2015-04-04 (×6): 1 via ORAL
  Filled 2015-03-30 (×9): qty 1

## 2015-03-30 MED ORDER — PANTOPRAZOLE SODIUM 20 MG PO TBEC
20.0000 mg | DELAYED_RELEASE_TABLET | Freq: Every day | ORAL | Status: DC
  Administered 2015-03-30 – 2015-04-04 (×6): 20 mg via ORAL
  Filled 2015-03-30 (×9): qty 1

## 2015-03-30 MED ORDER — DIGOXIN 125 MCG PO TABS
0.2500 mg | ORAL_TABLET | Freq: Every day | ORAL | Status: DC
  Administered 2015-03-30 – 2015-04-04 (×6): 0.25 mg via ORAL
  Filled 2015-03-30: qty 1
  Filled 2015-03-30 (×2): qty 2
  Filled 2015-03-30: qty 1
  Filled 2015-03-30 (×6): qty 2

## 2015-03-30 MED ORDER — THIAMINE HCL 100 MG/ML IJ SOLN
100.0000 mg | Freq: Once | INTRAMUSCULAR | Status: AC
  Administered 2015-03-30: 100 mg via INTRAMUSCULAR
  Filled 2015-03-30: qty 2

## 2015-03-30 MED ORDER — MAGNESIUM HYDROXIDE 400 MG/5ML PO SUSP: 30.0000 mL | Freq: Every day | ORAL | Status: DC | PRN

## 2015-03-30 MED ORDER — LOPERAMIDE HCL 2 MG PO CAPS: 2.0000 mg | ORAL_CAPSULE | ORAL | Status: AC | PRN

## 2015-03-30 MED ORDER — WARFARIN SODIUM 2.5 MG PO TABS
7.5000 mg | ORAL_TABLET | Freq: Once | ORAL | Status: AC
  Administered 2015-03-30: 7.5 mg via ORAL
  Filled 2015-03-30 (×2): qty 3

## 2015-03-30 MED ORDER — ONDANSETRON 4 MG PO TBDP
4.0000 mg | ORAL_TABLET | Freq: Four times a day (QID) | ORAL | Status: AC | PRN
  Administered 2015-03-31: 4 mg via ORAL
  Filled 2015-03-30: qty 1

## 2015-03-30 NOTE — Progress Notes (Signed)
Patient ID: Everlean PattersonMary Myung, female   DOB: Mar 09, 1945, 70 y.o.   MRN: [id number]  PER STATE REGULATIONS 482.30  THIS CHART WAS REVIEWED FOR MEDICAL NECESSITY WITH RESPECT TO THE PATIENT'S ADMISSION/DURATION OF STAY.  NEXT REVIEW DATE:04/03/15  Loura HaltBARBARA Avamae Dehaan, RN, BSN CASE MANAGER
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
## 2015-03-30 NOTE — Progress Notes (Signed)
ANTICOAGULATION CONSULT NOTE - Initial Consult  Pharmacy Consult for Warfarin Indication: atrial fibrillation, aortic and mitral valve replacements 2016  Allergies  Allergen Reactions  . Sulfa Antibiotics Photosensitivity    Yellow eyes    Patient Measurements: Height: 5\' 7"  (170.2 cm) Weight: 131 lb 8 oz (59.648 kg) IBW/kg (Calculated) : 61.6  Vital Signs: Temp: 97.7 F (36.5 C) (03/10 1812) Temp Source: Oral (03/10 1812) BP: 148/68 mmHg (03/10 1812) Pulse Rate: 71 (03/10 1812)  Labs:  Recent Labs  03/30/15 1844  LABPROT 21.0*  INR 1.90*  CREATININE 0.83    Estimated Creatinine Clearance: 61 mL/min (by C-G formula based on Cr of 0.83).   Medical History: Past Medical History  Diagnosis Date  . Brain aneurysm 10/20/13    3 on lt side and 1 on the right, most recent Oct 1  . Thyroid disease   . Hypertension   . COPD (chronic obstructive pulmonary disease) (HCC)   . Asthma   . Chronic back pain   . Sciatica   . Hyponatremia   . Stroke [facility](HCC)     seen on CT scan in 2015  . Episodic lightheadedness     since cerebral aneurysm repair 10/2013  . Atrial fibrillation (HCC)   . S/P mitral valve replacement with metallic valve 01/08/2015  . S/P aortic valve replacement with metallic valve 01/09/2015    [facility] 1995  . 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 Left coronary system traverses lateral to pulmonary artery.     Medications:  Scheduled:  . digoxin  0.25 mg Oral Daily  . fluticasone  1 spray Each Nare Daily  . [START ON 03/31/2015] levothyroxine  50 mcg Oral QAC breakfast  . LORazepam  1 mg Oral QID   Followed by  . [START ON 03/31/2015] LORazepam  1 mg Oral TID   Followed by  . [START ON 04/02/2015] LORazepam  1 mg Oral BID   Followed by  . [START ON 04/03/2015] LORazepam  1 mg Oral Daily  . losartan  50 mg Oral Daily  . metoprolol tartrate  12.5 mg Oral BID  . multivitamin with minerals  1  tablet Oral Daily  . nicotine  14 mg Transdermal Daily  . pantoprazole  20 mg Oral Daily  . sertraline  25 mg Oral Daily  . umeclidinium bromide  1 puff Inhalation Daily  . warfarin  7.5 mg Oral Once  . [START ON 03/31/2015] Warfarin - Pharmacist Dosing Inpatient   Does not apply q1800   Infusions:   PRN: acetaminophen, albuterol, alum & mag hydroxide-simeth, hydrOXYzine, loperamide, LORazepam, magnesium hydroxide, ondansetron, traZODone  Assessment: 70 yo female on chronic warfarin s/p aortic and mitral valve replacements December 2016. Pharmacy is consulted to continue warfarin dosing on admission. Per ED report, patient's INR was 4 last week and warfarin dosage was adjusted; INR is subtherapeutic today at 1.9  Goal of Therapy:  INR 2.5 - 3.5 per patient report   Plan:   Warfarin 7.5mg  PO x 1 tonight  Daily PT/INR  If INR does not trend up tomorrow, recommend bridging with Lovenox 1mg /kg q12h until therapeutic  Loralee PacasErin Raylon Lamson, PharmD, BCPS Pager: [phone] 03/30/2015,7:56 PM

## 2015-03-30 NOTE — Progress Notes (Signed)
Patient ID: Jasmine PattersonMary West, female   DOB: 1945/07/12, 70 y.o.   MRN: [id number] Patient extremely anxious upon admission.  Patient stated "I think I made the wrong choice coming here.  I didn't expect it to be like this.  I was taking lortab abusing and I stopped cold [place] in February.  I started to go down hill after that.  Patient was shaking and tearful upon admission and needed much consoling.  Patient  Was able to settle and was admitted without any incident.  Patient safety search and skin assessment complete.

## 2015-03-30 NOTE — H&P (Signed)
Psychiatric Admission Assessment Adult  Patient Identification: Jasmine West MRN:  [id number] Date of Evaluation:  03/30/2015 Chief Complaint:  "I have been getting more depressed for the last several weeks."  Principal Diagnosis: MDD (major depressive disorder), recurrent episode, severe (HCC) Diagnosis:   Patient Active Problem List   Diagnosis Date Noted  . 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 MDD (major depressive disorder), recurrent episode, severe (HCC) [F33.2] 03/30/2015  . Opiate addiction (HCC) [F11.20] 03/30/2015  . Abnormal findings on cardiac catheterization [R93.1]   . 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 NSTEMI (non-ST elevated myocardial infarction) (HCC) [I21.4] 01/09/2015  . Hypertension [I10] 01/08/2015  . 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 Atrial fibrillation (HCC) [I48.91] 01/08/2015  . Subtherapeutic international normalized ratio (INR) [R79.1]   . Rectal bleeding [K62.5] 05/13/2014  . Upper GI bleed [K92.2] 05/13/2014  . Tobacco use [Z72.0] 05/13/2014  . Elevated troponin [R79.89] 05/13/2014  . Leukocytosis [D72.829] 05/13/2014  . Hyponatremia [E87.1] 11/11/2013  . Hypothyroidism [E03.9] 11/11/2013  . Chronic back pain [M54.9, G89.29] 11/11/2013  . COPD (chronic obstructive pulmonary disease) (HCC) [J44.9] 11/11/2013  . Aneurysm, cerebral [I67.1] 11/11/2013   History of Present Illness::   Jasmine West is a 70 year old Caucasian female who presented to [facility] as a walk in with her husband and son. Patient was found by family this morning with a loaded gun verbalizing a plan to shoot herself. Her son was very concerned that his mother recently admitted to abusing lortab and has been having episodes of confusion. Her family shared that they were concerned patient has been experiencing depressive symptoms for a number of years but has only  been prescribed xanax by her cardiologist. Jasmine West is noted to be very anxious during her psychiatric assessment today. She admits to numerous symptoms of depression such as insomnia, anxiety, depressed mood, decreased appetite, decreased motivation, low energy, and suicidal thoughts. Patient denies any past attempts only having thoughts. Patient denies every having seen a Psychiatrist or being diagnosed with a mood disorder. The patient reports having several medical problems including chronic pain in her back from degenerative disc disease. Jasmine West has some insight into her xanax usage becoming a problem stating "After I stopped the opiates because I knew they were no good I became more anxious. I started breaking the xanax and taking a half every four hours. I don't know what I would do without them. I know I am different. I don't do the things I used to do. I feel like a burden to my family. I started thinking that they were better off without me." Her family feel that the patient likely has an underlying mood disorder and began to use the opiates to self treat. The family also deny that the patient has ever taken any antidepressant before. Patient and family were educated about the long term adverse effects of benzodiazepine use. They were informed by writer that other options to treat her anxiety and depression would be explored. Her son brought in her medication bottles to clarify doses as the patient is on several medications for chronic medical problems. Patient denies any recent use of opiate and does not appear to be experiencing withdrawal symptoms that are consistent with opiate use. Patient denies any illicit drug or alcohol use. A urine drug screen is pending.   Associated Signs/Symptoms: Depression Symptoms:  depressed mood, anhedonia, insomnia, psychomotor agitation, fatigue, feelings  of worthlessness/guilt, difficulty concentrating, hopelessness, recurrent thoughts of death, suicidal thoughts  with specific plan, anxiety, disturbed sleep, weight loss, decreased appetite, (Hypo) Manic Symptoms:  Denies Anxiety Symptoms:  Excessive Worry, Psychotic Symptoms:  Denies PTSD Symptoms: Negative Total Time spent with patient: 1 hour  Past Psychiatric History: Patient denies   Is the patient at risk to self? Yes.    Has the patient been a risk to self in the past 6 months? Yes.    Has the patient been a risk to self within the distant past? Yes.    Is the patient a risk to others? No.  Has the patient been a risk to others in the past 6 months? No.  Has the patient been a risk to others within the distant past? No.   Prior Inpatient Therapy: Prior Inpatient Therapy: Yes Prior Therapy Dates: 2000 Prior Therapy Facilty/Provider(s): [facility]  Reason for Treatment: SI Prior Outpatient Therapy: Prior Outpatient Therapy: No Prior Therapy Dates: NA Prior Therapy Facilty/Provider(s): NA Reason for Treatment: NA Does patient have an ACCT team?: No Does patient have Intensive In-House Services?  : No Does patient have [facility] services? : No Does patient have P4CC services?: No  Alcohol Screening:   Substance Abuse History in the last 12 months:  Yes.   Consequences of Substance Abuse: Family Consequences:  Son is concerned patient has been abusing opiate pain pills by buying them from a neighbor.  Reports stopping use of opiates six weeks ago with resolved diarrhea and muscle aches  Previous Psychotropic Medications: Yes  Psychological Evaluations: No  Past Medical History:  Past Medical History  Diagnosis Date  . Brain aneurysm 10/20/13    3 on lt side and 1 on the right, most recent Oct 1  . Thyroid disease   . Hypertension   . COPD (chronic obstructive pulmonary disease) (HCC)   . Asthma   . Chronic back pain   . Sciatica   . Hyponatremia   . Stroke [facility])     seen on CT scan in 2015  . Episodic lightheadedness     since cerebral aneurysm repair 10/2013  .  Atrial fibrillation (HCC)   . S/P mitral valve replacement with metallic valve 01/08/2015  . S/P aortic valve replacement with metallic valve 01/09/2015    [facility] 1995  . 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 Left coronary system traverses lateral to pulmonary artery.     Past Surgical History  Procedure Laterality Date  . Brain surgery      aneurysm stent placed 2015  . Cardiac surgery    . Cholecystectomy    . Appendectomy    . Cataract extraction    . Cardiac catheterization N/A 01/09/2015    Procedure: Left Heart Cath and Coronary Angiography;  Surgeon: Lennette Bihari, MD;  Location: [facility] INVASIVE CV LAB;  Service: Cardiovascular;  Laterality: N/A;   Family History:  Family History  Problem Relation Age of Onset  . Cancer Sister   . Cancer Brother   . Kidney failure Father    Family Psychiatric  History: Denies Tobacco Screening: Recently stopped smoking due to worsening COPD symptoms  Social History:  History  Alcohol Use No     History  Drug Use No    Additional Social History: Marital status: Married    History of alcohol / drug use?: Yes Longest period of sobriety (when/how long): 30 Days  Negative  Consequences of Use: Financial, Personal relationships Withdrawal Symptoms: Agitation, Irritability, Weakness, Sweats Name of Substance 1: Hydrocodone 1 - Age of First Use: 61 1 - Amount (size/oz): 10mg  1 - Frequency: Daily 1 - Duration: 5 years 1 - Last Use / Amount: 30 Days ago                   Allergies:   Allergies  Allergen Reactions  . Sulfa Antibiotics Photosensitivity    Yellow eyes   Lab Results: No results found for this or any previous visit (from the past 48 hour(s)).  Blood Alcohol level:  Lab Results  Component Value Date   ETH <5 01/07/2015    Metabolic Disorder Labs:  No results found for: HGBA1C, MPG No results found for: PROLACTIN No results found for: CHOL,  TRIG, HDL, CHOLHDL, VLDL, LDLCALC  Current Medications: Current Facility-Administered Medications  Medication Dose Route Frequency Provider Last Rate Last Dose  . acetaminophen (TYLENOL) tablet 650 mg  650 mg Oral Q6H PRN Thermon LeylandLaura A Davis, NP      . albuterol (PROVENTIL HFA;VENTOLIN HFA) 108 (90 Base) MCG/ACT inhaler 1 puff  1 puff Inhalation Q6H PRN Thermon LeylandLaura A Davis, NP      . alum & mag hydroxide-simeth (MAALOX/MYLANTA) 200-200-20 MG/5ML suspension 30 mL  30 mL Oral Q4H PRN Thermon LeylandLaura A Davis, NP      . digoxin (LANOXIN) tablet 0.25 mg  0.25 mg Oral Daily Thermon LeylandLaura A Davis, NP      . fluticasone (FLONASE) 50 MCG/ACT nasal spray 1 spray  1 spray Each Nare Daily Thermon LeylandLaura A Davis, NP      . hydrOXYzine (ATARAX/VISTARIL) tablet 25 mg  25 mg Oral Q6H PRN Thermon LeylandLaura A Davis, NP      . Melene Muller[START ON 03/31/2015] levothyroxine (SYNTHROID, LEVOTHROID) tablet 50 mcg  50 mcg Oral QAC breakfast Thermon LeylandLaura A Davis, NP      . loperamide (IMODIUM) capsule 2-4 mg  2-4 mg Oral PRN Thermon LeylandLaura A Davis, NP      . LORazepam (ATIVAN) tablet 1 mg  1 mg Oral Q6H PRN Thermon LeylandLaura A Davis, NP   1 mg at 03/30/15 1531  . LORazepam (ATIVAN) tablet 1 mg  1 mg Oral QID Thermon LeylandLaura A Davis, NP       Followed by  . [START ON 03/31/2015] LORazepam (ATIVAN) tablet 1 mg  1 mg Oral TID Thermon LeylandLaura A Davis, NP       Followed by  . [START ON 04/02/2015] LORazepam (ATIVAN) tablet 1 mg  1 mg Oral BID Thermon LeylandLaura A Davis, NP       Followed by  . [START ON 04/03/2015] LORazepam (ATIVAN) tablet 1 mg  1 mg Oral Daily Thermon LeylandLaura A Davis, NP      . losartan (COZAAR) tablet 50 mg  50 mg Oral Daily Thermon LeylandLaura A Davis, NP      . magnesium hydroxide (MILK OF MAGNESIA) suspension 30 mL  30 mL Oral Daily PRN Thermon LeylandLaura A Davis, NP      . metoprolol tartrate (LOPRESSOR) tablet 12.5 mg  12.5 mg Oral BID Thermon LeylandLaura A Davis, NP      . multivitamin with minerals tablet 1 tablet  1 tablet Oral Daily Thermon LeylandLaura A Davis, NP   1 tablet at 03/30/15 1526  . nicotine (NICODERM CQ - dosed in mg/24 hours) patch 14 mg  14 mg Transdermal Daily  Thermon LeylandLaura A Davis, NP   14 mg at 03/30/15 1526  . ondansetron (ZOFRAN-ODT) disintegrating tablet 4 mg  4 mg  Oral Q6H PRN Thermon Leyland, NP      . pantoprazole (PROTONIX) EC tablet 20 mg  20 mg Oral Daily Thermon Leyland, NP   20 mg at 03/30/15 1533  . sertraline (ZOLOFT) tablet 25 mg  25 mg Oral Daily Thermon Leyland, NP      . traZODone (DESYREL) tablet 50 mg  50 mg Oral QHS PRN Thermon Leyland, NP      . umeclidinium bromide (INCRUSE ELLIPTA) 62.5 MCG/INH 1 puff  1 puff Inhalation Daily Thermon Leyland, NP       PTA Medications: Prescriptions prior to admission  Medication Sig Dispense Refill Last Dose  . albuterol (PROVENTIL) (2.5 MG/3ML) 0.083% nebulizer solution Take 2.5 mg by nebulization every 6 (six) hours as needed for wheezing or shortness of breath.    unknown  . albuterol (VENTOLIN HFA) 108 (90 BASE) MCG/ACT inhaler Inhale 2 puffs into the lungs every 6 (six) hours as needed for wheezing or shortness of breath.    unknown  . ALPRAZolam (XANAX) 1 MG tablet Take 1 mg by mouth 3 (three) times daily.    03/29/2015 at Unknown time  . COUMADIN 5 MG tablet Take 5-7.5 mg by mouth at bedtime. 5mg  on Mondays, Wednesdays, and Fridays, then take 7.5mg  on all other days   03/29/2015 at Unknown time  . digoxin (LANOXIN) 0.25 MG tablet Take 0.25 mg by mouth every morning.   03/30/2015 at Unknown time  . docusate sodium (COLACE) 100 MG capsule Take 1 capsule (100 mg total) by mouth 2 (two) times daily. (Patient taking differently: Take 100 mg by mouth daily as needed for mild constipation or moderate constipation. ) 10 capsule 0 Past Week at Unknown time  . fluticasone (FLONASE) 50 MCG/ACT nasal spray Place 2 sprays into both nostrils every evening.    03/29/2015 at Unknown time  . furosemide (LASIX) 20 MG tablet Take 20 mg by mouth as needed for fluid.   03/29/2015 at Unknown time  . HYDROcodone-acetaminophen (NORCO) 10-325 MG tablet Take 0.5 tablets by mouth daily as needed for moderate pain or severe pain.   03/29/2015 at  Unknown time  . ipratropium-albuterol (DUONEB) 0.5-2.5 (3) MG/3ML SOLN Take 3 mLs by nebulization every 6 (six) hours as needed. (Patient taking differently: Take 3 mLs by nebulization every 6 (six) hours as needed (for shortness of breath). ) 360 mL  unknown at Unknown time  . levothyroxine (SYNTHROID, LEVOTHROID) 50 MCG tablet Take 50 mcg by mouth at bedtime.    03/30/2015 at Unknown time  . losartan (COZAAR) 50 MG tablet Take 50 mg by mouth every evening.   03/29/2015 at Unknown time  . metoprolol tartrate (LOPRESSOR) 25 MG tablet Take 12.5 mg by mouth 2 (two) times daily.   9 03/30/2015 at Unknown time  . montelukast (SINGULAIR) 10 MG tablet Take 10 mg by mouth every morning.   03/29/2015 at Unknown time  . pantoprazole (PROTONIX) 40 MG tablet Take 40 mg by mouth 2 (two) times daily.   unknown  . Umeclidinium Bromide (INCRUSE ELLIPTA) 62.5 MCG/INH AEPB Inhale 1 puff into the lungs at bedtime.   03/29/2015 at Unknown time  . nicotine (NICODERM CQ) 7 mg/24hr patch Place 1 patch (7 mg total) onto the skin daily. Start when 14 mg patches have finished (Patient not taking: Reported on 03/30/2015) 14 patch 0 03/15/2015 at Unknown time    Musculoskeletal: Strength & Muscle Tone: within normal limits Gait & Station: normal Patient leans: N/A  Psychiatric Specialty Exam: Physical Exam  Constitutional: She is oriented to person, place, and time. She appears well-developed and well-nourished.  HENT:  Head: Normocephalic and atraumatic.  Right Ear: External ear normal.  Left Ear: External ear normal.  Mouth/Throat: Oropharynx is clear and moist.  Eyes: Conjunctivae are normal. Pupils are equal, round, and reactive to light.  Neck: Normal range of motion. Neck supple.  Cardiovascular: Normal rate, regular rhythm, normal heart sounds and intact distal pulses.   Respiratory: Effort normal and breath sounds normal.  GI: Soft. Bowel sounds are normal.  Musculoskeletal: Normal range of motion.  Neurological:  She is alert and oriented to person, place, and time.  Skin: Skin is warm and dry.    ROS  There were no vitals taken for this visit.There is no weight on file to calculate BMI.  General Appearance: Casual  Eye Contact::  Good  Speech:  Clear and Coherent  Volume:  Normal  Mood:  Anxious  Affect:  Depressed  Thought Process:  Circumstantial  Orientation:  Full (Time, Place, and Person)  Thought Content:  Rumination  Suicidal Thoughts:  No Denies but was found this morning with a gun by her family   Homicidal Thoughts:  No  Memory:  Immediate;   Good  Judgement:  Fair  Insight:  Fair  Psychomotor Activity:  Decreased  Concentration:  Fair  Recall:  Fair  Fund of Knowledge:Good  Language: Good  Akathisia:  No  Handed:  Right  AIMS (if indicated):     Assets:  Communication Skills Desire for Improvement Financial Resources/Insurance Housing Intimacy Leisure Time Social Support  ADL's:  Impaired  Cognition: WNL  Sleep:        Treatment Plan Summary: Daily contact with patient to assess and evaluate symptoms and progress in treatment and Medication management  Observation Level/Precautions:  15 minute checks  Laboratory:  Chemistry Profile UDS TSH, Digoxin, Pt/INR  Psychotherapy:  Individual/Group   Medications:  Ativan protocol to taper off xanax, Start Zoloft 25 mg daily for depressive symptoms, continue home medications for medical condition that was verified using medication bottles brought in by family.   Consultations: Pharmacy for PT/INR monitoring with coumadin dosing. Pharmacy contacted to evaluate patient for coumadin dosing after evening blood draw on 03/30/2015  Discharge Concerns:  Continued benzodiazepine abuse   Estimated LOS: 2-5 days   Other:  Gather collateral information from family    I certify that inpatient services furnished can reasonably be expected to improve the patient's condition.    Fransisca Kaufmann, NP 3/10/20174:49 PM I personally  assessed the patient, reviewed the physical exam and labs and formulated the treatment plan Madie Reno A. Dub Mikes, M.D.

## 2015-03-30 NOTE — Progress Notes (Signed)
D: Patient observed interacting with peers. Patient spoke with this writer she was anxious and worried about her medications. Patient reassured and medications reviewed with patient. Patient stated goal for the day was "to get familiar with facility and how things are done."   A: Patient provided encouragement and support and facility process reviewed with patient. Q 15 minute checks in progress and maintained for safety. Patient denies SI at this time. Monitoring continues. R: Patient remains safe on unit.

## 2015-03-31 LAB — PROTIME-INR
INR: 2.03 — AB (ref 0.00–1.49)
PROTHROMBIN TIME: 22.1 s — AB (ref 11.6–15.2)

## 2015-03-31 MED ORDER — TRAZODONE HCL 50 MG PO TABS
50.0000 mg | ORAL_TABLET | Freq: Every evening | ORAL | Status: DC | PRN
  Administered 2015-03-31 – 2015-04-03 (×3): 50 mg via ORAL
  Filled 2015-03-31 (×12): qty 1

## 2015-03-31 MED ORDER — WARFARIN SODIUM 7.5 MG PO TABS
7.5000 mg | ORAL_TABLET | Freq: Once | ORAL | Status: AC
  Administered 2015-03-31: 7.5 mg via ORAL
  Filled 2015-03-31: qty 1

## 2015-03-31 MED ORDER — ENOXAPARIN SODIUM 60 MG/0.6ML ~~LOC~~ SOLN
1.0000 mg/kg | Freq: Two times a day (BID) | SUBCUTANEOUS | Status: DC
  Administered 2015-03-31 – 2015-04-02 (×5): 60 mg via SUBCUTANEOUS
  Filled 2015-03-31 (×9): qty 0.6

## 2015-03-31 NOTE — Progress Notes (Addendum)
Expand All Collapse All   ANTICOAGULATION CONSULT NOTE - Follow up Consult  Pharmacy Consult for Warfarin Indication: atrial fibrillation, aortic and mitral valve replacements 2016  Allergies  Allergen Reactions  . Sulfa Antibiotics Photosensitivity    Yellow eyes    Patient Measurements: Height: 5\' 7"  (170.2 cm) Weight: 131 lb 8 oz (59.648 kg) IBW/kg (Calculated) : 61.6  Vital Signs: Temp: 97.7 F (36.5 C) (03/10 1812) Temp Source: Oral (03/10 1812) BP: 148/68 mmHg (03/10 1812) Pulse Rate: 71 (03/10 1812)  Labs:  Recent Labs (last 2 labs)      Recent Labs  03/30/15 1844  LABPROT 21.0*  INR 1.90*  CREATININE 0.83      Estimated Creatinine Clearance: 61 mL/min (by C-G formula based on Cr of 0.83).   Medical History: Past Medical History  Diagnosis Date  . Brain aneurysm 10/20/13    3 on lt side and 1 on the right, most recent Oct 1  . Thyroid disease   . Hypertension   . COPD (chronic obstructive pulmonary disease) (HCC)   . Asthma   . Chronic back pain   . Sciatica   . Hyponatremia   . Stroke [facility](HCC)     seen on CT scan in 2015  . Episodic lightheadedness     since cerebral aneurysm repair 10/2013  . Atrial fibrillation (HCC)   . S/P mitral valve replacement with metallic valve 01/08/2015  . S/P aortic valve replacement with metallic valve 01/09/2015    [facility] 1995  . 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 Left coronary system traverses lateral to pulmonary artery.     Medications:  Scheduled:  . digoxin 0.25 mg Oral Daily  . fluticasone 1 spray Each Nare Daily  . [START ON 03/31/2015] levothyroxine 50 mcg Oral QAC breakfast  . LORazepam 1 mg Oral QID   Followed by  . [START ON 03/31/2015] LORazepam 1 mg Oral TID   Followed by  . [START ON 04/02/2015] LORazepam 1 mg Oral  BID   Followed by  . [START ON 04/03/2015] LORazepam 1 mg Oral Daily  . losartan 50 mg Oral Daily  . metoprolol tartrate 12.5 mg Oral BID  . multivitamin with minerals 1 tablet Oral Daily  . nicotine 14 mg Transdermal Daily  . pantoprazole 20 mg Oral Daily  . sertraline 25 mg Oral Daily  . umeclidinium bromide 1 puff Inhalation Daily  . warfarin 7.5 mg Oral Once  . [START ON 03/31/2015] Warfarin - Pharmacist Dosing Inpatient  Does not apply q1800   Infusions:   PRN: acetaminophen, albuterol, alum & mag hydroxide-simeth, hydrOXYzine, loperamide, LORazepam, magnesium hydroxide, ondansetron, traZODone  Assessment: 70 yo female on chronic warfarin s/p aortic and mitral valve replacements December 2016. Pharmacy is consulted to continue warfarin dosing on admission. Per ED report, patient's INR was 4 last week and warfarin dosage was adjusted; INR is subtherapeutic today at 2.03 Goal of Therapy:  INR 2.5 - 3.5 per patient report  Plan:   Warfarin 7.5mg  PO x 1 tonight  Daily PT/INR  Patient being bridged with Loveonox now (60mg  SQ q 12 hours Etta QuillMeredith Olita Takeshita, [place]

## 2015-03-31 NOTE — Progress Notes (Signed)
D-  Patient has been very anxious and worried over things that are out of her control such as her husband's impending back surgery.  Patient is also extremely worried about her medications.  Patient often becomes tearful and is unable to understand what is trying to be explained to her when she asks a question.  Patient reports feeling "foggy and confused" which she attributes to taking zoloft. Patient denies SI, HI and AVH.   A- assess patient for safety, encourage patient to talk with physician about medication changes, offer medications as prescribed, monitor for side effects of medication  R-  Patient is able to contract for safety

## 2015-03-31 NOTE — Progress Notes (Signed)
[facility] MD Progress Note  03/31/2015 10:36 AM Jasmine West  MRN:  [id number] Subjective:  Patient reports " I need my xanax I can't think without it, This ativan is not working"  Objective:Jasmine West is awake, alert and oriented X3 , found resting in bedroom.  Denies suicidal or homicidal ideation. Denies auditory or visual hallucination and does not appear to be responding to internal stimuli. Patient reports she is medication compliant without mediation side effects.  States her depression 8/10.  Patient reports that her anxiety is 10/10 and is unsure how to get a handle on these nerves. Patient states "I am not feeling not myself today"  Patient reports feeling on " edge". States that she has a fair appetite and is resting a little. Support, encouragement and reassurance was provided.   Principal Problem: MDD (major depressive disorder), recurrent episode, severe ([place]) Diagnosis:   Patient Active Problem List   Diagnosis Date Noted  . Benzodiazepine dependence ([place]) [F13.20] 03/30/2015  . MDD (major depressive disorder), recurrent episode, severe ([place]) [F33.2] 03/30/2015  . Opiate addiction ([place]) [F11.20] 03/30/2015  . GAD (generalized anxiety disorder) [F41.1] 03/30/2015  . Abnormal findings on cardiac catheterization [R93.1]   . 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 S/P aortic valve replacement with metallic valve [J67.3] 41/93/7902  . NSTEMI (non-ST elevated myocardial infarction) ([place]) [I21.4] 01/09/2015  . Hypertension [I10] 01/08/2015  . S/P mitral valve replacement with metallic valve [I09.7] 35/32/9924  . Atrial fibrillation ([place]) [I48.91] 01/08/2015  . Subtherapeutic international normalized ratio (INR) [R79.1]   . Rectal bleeding [K62.5] 05/13/2014  . Upper GI bleed [K92.2] 05/13/2014  . Tobacco use [Z72.0] 05/13/2014  . Elevated troponin [R79.89] 05/13/2014  . Leukocytosis [D72.829] 05/13/2014  . Hyponatremia [E87.1] 11/11/2013  . Hypothyroidism [E03.9] 11/11/2013  .  Chronic back pain [M54.9, G89.29] 11/11/2013  . COPD (chronic obstructive pulmonary disease) ([place]) [J44.9] 11/11/2013  . Aneurysm, cerebral [I67.1] 11/11/2013   Total Time spent with patient: 30 minutes  Past Psychiatric History: See Above  Past Medical History:  Past Medical History  Diagnosis Date  . Brain aneurysm 10/20/13    3 on lt side and 1 on the right, most recent Oct 1  . Thyroid disease   . Hypertension   . COPD (chronic obstructive pulmonary disease) ([place])   . Asthma   . Chronic back pain   . Sciatica   . Hyponatremia   . Stroke [facility])     seen on CT scan in 2015  . Episodic lightheadedness     since cerebral aneurysm repair 10/2013  . Atrial fibrillation ([place])   . S/P mitral valve replacement with metallic valve 26/83/4196  . S/P aortic valve replacement with metallic valve 22/29/7989    [facility] 1995  . 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 Left coronary system traverses lateral to pulmonary artery.     Past Surgical History  Procedure Laterality Date  . Brain surgery      aneurysm stent placed 2015  . Cardiac surgery    . Cholecystectomy    . Appendectomy    . Cataract extraction    . Cardiac catheterization N/A 01/09/2015    Procedure: Left Heart Cath and Coronary Angiography;  Surgeon: Troy Sine, MD;  Location: [place] [facility];  Service: Cardiovascular;  Laterality: N/A;   Family History:  Family History  Problem Relation Age of Onset  . Cancer Sister   .  Cancer Brother   . Kidney failure Father    Family Psychiatric  History: See above Social History:  History  Alcohol Use No     History  Drug Use No    Social History   Social History  . Marital Status: Married    Spouse Name: N/A  . Number of Children: N/A  . Years of Education: N/A   Social History Main Topics  . Smoking status: Former Smoker -- 1.00 packs/day for 40 years    Types: Cigarettes    Quit date:  01/07/2015  . Smokeless tobacco: Never Used  . Alcohol Use: No  . Drug Use: No  . Sexual Activity: Not Asked   Other Topics Concern  . None   Social History Narrative   Additional Social History:    History of alcohol / drug use?: Yes Longest period of sobriety (when/how long): 30 Days  Negative Consequences of Use: Financial, Personal relationships Withdrawal Symptoms: Agitation, Irritability, Weakness, Sweats Name of Substance 1: Hydrocodone 1 - Age of First Use: 61 1 - Amount (size/oz): 4m 1 - Frequency: Daily 1 - Duration: 5 years 1 - Last Use / Amount: 30 Days ago                   Sleep: Fair  Appetite:  Fair  Current Medications: Current Facility-Administered Medications  Medication Dose Route Frequency Provider Last Rate Last Dose  . acetaminophen (TYLENOL) tablet 650 mg  650 mg Oral Q6H PRN LNiel Hummer NP      . albuterol (PROVENTIL HFA;VENTOLIN HFA) 108 (90 Base) MCG/ACT inhaler 1 puff  1 puff Inhalation Q6H PRN LNiel Hummer NP      . alum & mag hydroxide-simeth (MAALOX/MYLANTA) 200-200-20 MG/5ML suspension 30 mL  30 mL Oral Q4H PRN LNiel Hummer NP      . digoxin (Fonnie [place] tablet 0.25 mg  0.25 mg Oral Daily LNiel Hummer NP   0.25 mg at 03/31/15 0818  . enoxaparin (LOVENOX) injection 60 mg  1 mg/kg Subcutaneous Q12H MJulien Girt RPH      . fluticasone (FLONASE) 50 MCG/ACT nasal spray 1 spray  1 spray Each Nare Daily LNiel Hummer NP   1 spray at 03/31/15 0819  . hydrOXYzine (ATARAX/VISTARIL) tablet 25 mg  25 mg Oral Q6H PRN LNiel Hummer NP   25 mg at 03/31/15 0714  . levothyroxine (SYNTHROID, LEVOTHROID) tablet 50 mcg  50 mcg Oral QAC breakfast LNiel Hummer NP   50 mcg at 03/31/15 0616  . loperamide (IMODIUM) capsule 2-4 mg  2-4 mg Oral PRN LNiel Hummer NP      . LORazepam (ATIVAN) tablet 1 mg  1 mg Oral Q6H PRN LNiel Hummer NP   1 mg at 03/30/15 1531  . LORazepam (ATIVAN) tablet 1 mg  1 mg Oral QID LNiel Hummer NP   1 mg at 03/31/15  0818   Followed by  . LORazepam (ATIVAN) tablet 1 mg  1 mg Oral TID LNiel Hummer NP       Followed by  . [START ON 04/02/2015] LORazepam (ATIVAN) tablet 1 mg  1 mg Oral BID LNiel Hummer NP       Followed by  . [START ON 04/03/2015] LORazepam (ATIVAN) tablet 1 mg  1 mg Oral Daily LNiel Hummer NP      . losartan (COZAAR) tablet 50 mg  50 mg Oral Daily LNiel Hummer NP  50 mg at 03/31/15 0818  . magnesium hydroxide (MILK OF MAGNESIA) suspension 30 mL  30 mL Oral Daily PRN Laura A Davis, NP      . metoprolol tartrate (LOPRESSOR) tablet 12.5 mg  12.5 mg Oral BID Laura A Davis, NP   12.5 mg at 03/31/15 0816  . multivitamin with minerals tablet 1 tablet  1 tablet Oral Daily Laura A Davis, NP   1 tablet at 03/31/15 0816  . nicotine (NICODERM CQ - dosed in mg/24 hours) patch 14 mg  14 mg Transdermal Daily Laura A Davis, NP   14 mg at 03/31/15 0821  . ondansetron (ZOFRAN-ODT) disintegrating tablet 4 mg  4 mg Oral Q6H PRN Laura A Davis, NP   4 mg at 03/31/15 0616  . pantoprazole (PROTONIX) EC tablet 20 mg  20 mg Oral Daily Laura A Davis, NP   20 mg at 03/31/15 0816  . sertraline (ZOLOFT) tablet 25 mg  25 mg Oral Daily Laura A Davis, NP   25 mg at 03/31/15 0816  . traZODone (DESYREL) tablet 50 mg  50 mg Oral QHS PRN Laura A Davis, NP      . umeclidinium bromide (INCRUSE ELLIPTA) 62.5 MCG/INH 1 puff  1 puff Inhalation Daily Laura A Davis, NP   1 puff at 03/30/15 1816  . Warfarin - Pharmacist Dosing Inpatient   Does not apply q1800 Erin R Williamson, RPH        Lab Results:  Results for orders placed or performed during the hospital encounter of 03/30/15 (from the past 48 hour(s))  Comprehensive metabolic panel     Status: Abnormal   Collection Time: 03/30/15  6:44 PM  Result Value Ref Range   Sodium 125 (L) 135 - 145 mmol/L   Potassium 3.9 3.5 - 5.1 mmol/L   Chloride 88 (L) 101 - 111 mmol/L   CO2 25 22 - 32 mmol/L   Glucose, Bld 160 (H) 65 - 99 mg/dL   BUN 13 6 - 20 mg/dL   Creatinine, Ser  0.83 0.44 - 1.00 mg/dL   Calcium 9.5 8.9 - 10.3 mg/dL   Total Protein 8.1 6.5 - 8.1 g/dL   Albumin 5.2 (H) 3.5 - 5.0 g/dL   AST 36 15 - 41 U/L   ALT 20 14 - 54 U/L   Alkaline Phosphatase 72 38 - 126 U/L   Total Bilirubin 2.2 (H) 0.3 - 1.2 mg/dL   GFR calc non Af Amer >60 >60 mL/min   GFR calc Af Amer >60 >60 mL/min    Comment: (NOTE) The eGFR has been calculated using the CKD EPI equation. This calculation has not been validated in all clinical situations. eGFR's persistently <60 mL/min signify possible Chronic Kidney Disease.    Anion gap 12 5 - 15    Comment: Performed at [facility]  TSH     Status: None   Collection Time: 03/30/15  6:44 PM  Result Value Ref Range   TSH 0.980 0.350 - 4.500 uIU/mL    Comment: Performed at [facility]  Ethanol     Status: None   Collection Time: 03/30/15  6:44 PM  Result Value Ref Range   Alcohol, Ethyl (B) <5 <5 mg/dL    Comment:        LOWEST DETECTABLE LIMIT FOR SERUM ALCOHOL IS 5 mg/dL FOR MEDICAL PURPOSES ONLY Performed at [facility]   Protime-INR     Status: Abnormal   Collection Time: 03/30/15    6:44 PM  Result Value Ref Range   Prothrombin Time 21.0 (H) 11.6 - 15.2 seconds   INR 1.90 (H) 0.00 - 1.49    Comment: Performed at [facility]  Digoxin level     Status: None   Collection Time: 03/30/15  6:44 PM  Result Value Ref Range   Digoxin Level 0.8 0.8 - 2.0 ng/mL    Comment: Performed at [place]     Status: Abnormal   Collection Time: 03/31/15  6:53 AM  Result Value Ref Range   Prothrombin Time 22.1 (H) 11.6 - 15.2 seconds   INR 2.03 (H) 0.00 - 1.49    Comment: Performed at [facility]    Blood Alcohol level:  Lab Results  Component Value Date   [facility] <5 03/30/2015   ETH <5 01/07/2015    Physical Findings: AIMS:  , ,  ,  ,    CIWA:  CIWA-Ar Total: 4 COWS:     Musculoskeletal: Strength  & Muscle Tone: within normal limits Gait & Station: normal Patient leans: N/A  Psychiatric Specialty Exam: Review of Systems  Psychiatric/Behavioral: Positive for depression. Negative for suicidal ideas and hallucinations. The patient is nervous/anxious and has insomnia.   All other systems reviewed and are negative.   Blood pressure 158/77, pulse 81, temperature 98.3 F (36.8 C), temperature source Oral, resp. rate 20, height 5' 7" (1.702 m), weight 59.648 kg (131 lb 8 oz), SpO2 100 %.Body mass index is 20.59 kg/(m^2).  General Appearance: Fairly Groomed  Engineer, water::  Good  Speech:  Clear and Coherent  Volume:  Normal  Mood:  Anxious, Depressed and Irritable  Affect:  Congruent  Thought Process:  Intact  Orientation:  Full (Time, Place, and Person)  Thought Content:  Rumination with symptoms of worries  Suicidal Thoughts:  No patient denies suicidal ideations at this time  Homicidal Thoughts:  No  Memory:  Immediate;   Fair Recent;   Fair Remote;   Fair  Judgement:  Intact  Insight:  Lacking  Psychomotor Activity:  Restlessness  Concentration:  Poor  Recall:  [employer] of Knowledge:Fair  Language: Good  Akathisia:  No  Handed:  Right  AIMS (if indicated):     Assets:  Resilience  ADL's:  Intact  Cognition: WNL  Sleep:        I agree with current treatment plan on 03/31/2015, Patient seen face-to-face for psychiatric evaluation follow-up, chart reviewed. Reviewed the information documented and agree with the treatment plan.  Treatment Plan Summary: Daily contact with patient to assess and evaluate symptoms and progress in treatment and Medication management  Continue Ativan protocol to taper off xanax,  Continue Zoloft 25 mg daily for depressive symptoms, continue home medications for medical condition that was verified using medication bottles brought in by family.  Start with Trazodone 50 mg X1 repeat mg for insomnia Will continue to monitor vitals ,medication  compliance and treatment side effects while patient is here. Pharmacist consult- completed. See consult note. Dosing titrated with bridge treatment of Lovenox- [place] Pharmacist  Labs: Placed orders for  Hemoglobin A1c, INR and UDS- pending  Reviewed labs Glucose 160 elevated ,BAL-negative  CSW will start working on disposition.  Patient to participate in therapeutic milieu  Derrill Center, NP 03/31/2015, 10:36 AM  I reviewed chart and agreed with the findings and treatment Plan.  Berniece Andreas, MD

## 2015-03-31 NOTE — [facility] Group Notes (Signed)
[facility] Group Notes:  (Clinical Social Work)   11/18/2014     10:00-11:00AM  Summary of Progress/Problems:   In today's process group a decisional balance exercise was used to explore in depth the perceived benefits and costs of alcohol and drugs, as well as the  benefits and costs of replacing these with healthy coping skills.  Patients listed unhealthy coping techniques, determining with CSW guidance that unhealthy coping techniques work initially, but eventually become harmful.  Motivational Interviewing and the whiteboard were utilized for the exercises.  The patient was late to group and did not speak any during the time she was present.  She was asked a question by group leader, said her thinking was unclear today, and later left the group again.  Type of Therapy:  Group Therapy - Process   Participation Level:  Minimal  Participation Quality:  Inattentive  Affect:  Flat  Cognitive:  Confused  Insight:  Poor  Engagement in Therapy:  Poor  Modes of Intervention:  Education, Motivational Interviewing  Ambrose MantleMareida Grossman-Orr, LCSW 03/31/2015, 12:47 PM

## 2015-03-31 NOTE — [facility] Counselor (Signed)
Clinical Social Work Note  Attempted to do Psychosocial Assessment with pt, who said she was not feeling well and had started on new medication, so would prefer to wait until tomorrow.  Ambrose MantleMareida Grossman-Orr, LCSW 03/31/2015, 3:24 PM

## 2015-03-31 NOTE — Progress Notes (Signed)
D.  Pt pleasant but anxious on approach.  There had been a difficult CIRT on [address] hall which could be heard on her hall and Pt became very concerned.  Pt also was unsure of her medications and often asked her roommate "should I take this?".   Pt has been visible in milieu and interacting mostly with roommate.  Pt denies SI/HI/hallucinations at this time.  A.  Pt reassured of her safety on unit and allowed to take her bedtime medications 15 minutes early so that she could go to her room and prepare for bed.  Pt then stated her stomach had been bothering her and this staff member gave Maalox as ordered for this.  Pt states that she is on Protonix at home but only takes it as needed because it "messes up my stomach if I take it daily".  She has been ordered it daily here and so note will be left for physician about this on Pt's behalf.  R.  Pt remains safe on unit, will continue to monitor.

## 2015-04-01 DIAGNOSIS — F332 Major depressive disorder, recurrent severe without psychotic features: Secondary | ICD-10-CM | POA: Insufficient documentation

## 2015-04-01 LAB — PROTIME-INR
INR: 2.36 — AB (ref 0.00–1.49)
INR: 2.4 — AB (ref 0.00–1.49)
PROTHROMBIN TIME: 25.6 s — AB (ref 11.6–15.2)
PROTHROMBIN TIME: 25.8 s — AB (ref 11.6–15.2)

## 2015-04-01 MED ORDER — WARFARIN SODIUM 5 MG PO TABS
5.0000 mg | ORAL_TABLET | Freq: Once | ORAL | Status: AC
  Administered 2015-04-01: 5 mg via ORAL
  Filled 2015-04-01: qty 1

## 2015-04-01 NOTE — [facility] Group Notes (Signed)
[facility] Group Notes:  (Nursing/MHT/Case Management/Adjunct)  Date:  04/01/2015  Time:  10:29 AM  Type of Therapy:  Psychoeducational Skills  Participation Level:  Did Not Attend  Participation Quality:  N/A  Affect:  N/A  Cognitive:  N/A  Insight:  None  Engagement in Group:  None  Modes of Intervention:  Discussion and Education  Summary of Progress/Problems: Patient was invited but did not attend.   Anoop Hemmer E 04/01/2015, 10:29 AM 

## 2015-04-01 NOTE — Progress Notes (Signed)
ANTICOAGULATION CONSULT NOTE - Follow Up Consult   Pharmacy Consult for Warfarin Indication: atrial fibrillation, aortic and mitral valve replacements 2016  Allergies  Allergen Reactions  . Sulfa Antibiotics Photosensitivity    Yellow eyes    Patient Measurements: Height: 5\' 7"  (170.2 cm) Weight: 131 lb 8 oz (59.648 kg) IBW/kg (Calculated) : 61.6 Heparin Dosing Weight:   Vital Signs:    Labs:  Recent Labs  03/30/15 1844 03/31/15 0653  LABPROT 21.0* 22.1*  INR 1.90* 2.03*  CREATININE 0.83  --     Estimated Creatinine Clearance: 61 mL/min (by C-G formula based on Cr of 0.83).   Medications:  Scheduled:  . digoxin  0.25 mg Oral Daily  . enoxaparin (LOVENOX) injection  1 mg/kg Subcutaneous Q12H  . fluticasone  1 spray Each Nare Daily  . levothyroxine  50 mcg Oral QAC breakfast  . LORazepam  1 mg Oral TID   Followed by  . [START ON 04/02/2015] LORazepam  1 mg Oral BID   Followed by  . [START ON 04/03/2015] LORazepam  1 mg Oral Daily  . losartan  50 mg Oral Daily  . metoprolol tartrate  12.5 mg Oral BID  . multivitamin with minerals  1 tablet Oral Daily  . nicotine  14 mg Transdermal Daily  . pantoprazole  20 mg Oral Daily  . sertraline  25 mg Oral Daily  . traZODone  50 mg Oral QHS,MR X 1  . umeclidinium bromide  1 puff Inhalation Daily  . Warfarin - Pharmacist Dosing Inpatient   Does not apply q1800   PRN: acetaminophen, albuterol, alum & mag hydroxide-simeth, hydrOXYzine, loperamide, LORazepam, magnesium hydroxide, ondansetron  Assessment: 70 yo female on chronic warfarin s/p aortic and mitral valve replacements December 2016. Pharmacy is consulted to continue warfarin dosing on admission. Per ED report, patient's INR was 4 last week and warfarin dosage was adjusted;  3/12 am:  Called [facility] lab and pt refused AM blood draw. It has been noted to be draw by lab this evening when I checked with them.  Goal of Therapy:  INR 2.5-3.5 per patient report    Plan:   Lab will draw PT/INR this evening.  Will order PT/INR for 3/13 AM.  Loletta Specterenz, Destinae Neubecker Jamieson 04/01/2015,9:09 AM

## 2015-04-01 NOTE — Progress Notes (Signed)
Nursing Shift Note:  Patient anxious most of the shift thus far, was trembling before receiving her scheduled 1400 Ativan. Patient is cooperative but restless and does need frequent redirection. Patient compliant with all medications and denies any SI/HI/AVH and contracts for safety on the Unit in that she verbally agrees to alert staff first before engaging in any self harm activity. Nurse ensuring patient having q15 minute checks for safety continuously, providing medications as ordered, emotional support. Patient remains safe on the Unit.

## 2015-04-01 NOTE — Progress Notes (Signed)
ANTICOAGULATION CONSULT NOTE - Follow Up Consult   Pharmacy Consult for Warfarin Indication: atrial fibrillation, aortic and mitral valve replacements 2016  Allergies  Allergen Reactions  . Sulfa Antibiotics Photosensitivity    Yellow eyes    Patient Measurements: Height: 5\' 7"  (170.2 cm) Weight: 131 lb 8 oz (59.648 kg) IBW/kg (Calculated) : 61.6 Heparin Dosing Weight:   Vital Signs: BP: 167/65 mmHg (03/12 1701) Pulse Rate: 61 (03/12 1701)  Labs:  Recent Labs  03/30/15 1844 03/31/15 0653 04/01/15 1823 04/01/15 1923  LABPROT 21.0* 22.1* 25.6* 25.8*  INR 1.90* 2.03* 2.36* 2.40*  CREATININE 0.83  --   --   --     Estimated Creatinine Clearance: 61 mL/min (by C-G formula based on Cr of 0.83).   Medications:  Scheduled:  . digoxin  0.25 mg Oral Daily  . enoxaparin (LOVENOX) injection  1 mg/kg Subcutaneous Q12H  . fluticasone  1 spray Each Nare Daily  . levothyroxine  50 mcg Oral QAC breakfast  . [START ON 04/02/2015] LORazepam  1 mg Oral BID   Followed by  . [START ON 04/03/2015] LORazepam  1 mg Oral Daily  . losartan  50 mg Oral Daily  . metoprolol tartrate  12.5 mg Oral BID  . multivitamin with minerals  1 tablet Oral Daily  . nicotine  14 mg Transdermal Daily  . pantoprazole  20 mg Oral Daily  . sertraline  25 mg Oral Daily  . traZODone  50 mg Oral QHS,MR X 1  . umeclidinium bromide  1 puff Inhalation Daily  . Warfarin - Pharmacist Dosing Inpatient   Does not apply q1800   PRN: acetaminophen, albuterol, alum & mag hydroxide-simeth, hydrOXYzine, loperamide, LORazepam, magnesium hydroxide, ondansetron  Assessment: 70 yo female on chronic warfarin s/p aortic and mitral valve replacements December 2016. Pharmacy is consulted to continue warfarin dosing on admission. Per ED report, patient's INR was 4 last week and warfarin dosage was adjusted;  Home warfarin dose: 7.5mg  daily except 5mg  on M/W/F.    3/12:  Pt refused INR check this AM.   Evening INR 2.40 -  slightly subtherapeutic but trending up.  Remains on full dose lovenox until INR at goal.    Goal of Therapy:  INR 2.5-3.5 per patient report    Plan:  Warfarin 5mg  po x 1 tonight.  Daily PT/INR  Haynes Hoehnolleen Ebonie Westerlund, PharmD, BCPS 04/01/2015, 8:28 PM  Pager: [phone]

## 2015-04-01 NOTE — [facility] Group Notes (Signed)
[facility] Group Notes:  (Clinical Social Work)  04/01/2015  10:00-11:00AM  Summary of Progress/Problems:   The main focus of today's process group was to   1)  discuss the importance of adding supports  2)  define health supports versus unhealthy supports  3)  identify the patient's current unhealthy supports and plan how to handle them  4)  Identify the patient's current healthy supports and plan what to add.  An emphasis was placed on using counselor, doctor, therapy groups, 12-step groups, and problem-specific support groups to expand supports.    The patient expressed full comprehension of the concepts presented, and agreed that there is a need to add more supports.  The patient listened attentively but did not offer any ideas during group.  She remained alert and focused today.  Type of Therapy:  Process Group with Motivational Interviewing  Participation Level:  Active  Participation Quality:  Attentive  Affect:  Blunted  Cognitive:  Oriented  Insight:  Improving  Engagement in Therapy:  Improving  Modes of Intervention:   Education, Support and Processing, Activity  Ambrose MantleMareida Grossman-Orr, LCSW 04/01/2015

## 2015-04-01 NOTE — Progress Notes (Signed)
Patient did attend the evening speaker AA meeting. Pt came out of meeting at 2050 requesting her night medication because she was ready for sleep.

## 2015-04-01 NOTE — Progress Notes (Addendum)
[facility] MD Progress Note  04/01/2015 9:42 AM Jasmine West  MRN:  [id number] Subjective:  Patient reports "I feel weak, please don't take my ativan away. "  Objective:Jasmine West is awake, alert and oriented X4 , found standing at the nurses stations.  Denies suicidal or homicidal ideation. Denies auditory or visual hallucination and does not appear to be responding to internal stimuli. Patient reports she is medication compliant without mediation side effects. States her depression 0/10 Patient reports I don't feel depressed.  Patient reports that her anxiety is still 10/10. Patient states "I am not feeling okay, I think I am weak or maybe my nerves are bothering me."  Patient reports feeling on " edge" all the time. States that she has a fair appetite and is resting a little. Support, encouragement and reassurance was provided.   Principal Problem: MDD (major depressive disorder), recurrent episode, severe ([place]) Diagnosis:   Patient Active Problem List   Diagnosis Date Noted  . Severe episode of recurrent major depressive disorder, without psychotic features ([place]) [F33.2]   . Benzodiazepine dependence ([place]) [F13.20] 03/30/2015  . MDD (major depressive disorder), recurrent episode, severe ([place]) [F33.2] 03/30/2015  . Opiate addiction ([place]) [F11.20] 03/30/2015  . GAD (generalized anxiety disorder) [F41.1] 03/30/2015  . Abnormal findings on cardiac catheterization [R93.1]   . 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 S/P aortic valve replacement with metallic valve [L97.6] 73/41/9379  . NSTEMI (non-ST elevated myocardial infarction) ([place]) [I21.4] 01/09/2015  . Hypertension [I10] 01/08/2015  . S/P mitral valve replacement with metallic valve [K24.0] 97/35/3299  . Atrial fibrillation ([place]) [I48.91] 01/08/2015  . Subtherapeutic international normalized ratio (INR) [R79.1]   . Rectal bleeding [K62.5] 05/13/2014  . Upper GI bleed [K92.2] 05/13/2014  . Tobacco use [Z72.0] 05/13/2014  . Elevated  troponin [R79.89] 05/13/2014  . Leukocytosis [D72.829] 05/13/2014  . Hyponatremia [E87.1] 11/11/2013  . Hypothyroidism [E03.9] 11/11/2013  . Chronic back pain [M54.9, G89.29] 11/11/2013  . COPD (chronic obstructive pulmonary disease) ([place]) [J44.9] 11/11/2013  . Aneurysm, cerebral [I67.1] 11/11/2013   Total Time spent with patient: 30 minutes  Past Psychiatric History: See Above  Past Medical History:  Past Medical History  Diagnosis Date  . Brain aneurysm 10/20/13    3 on lt side and 1 on the right, most recent Oct 1  . Thyroid disease   . Hypertension   . COPD (chronic obstructive pulmonary disease) ([place])   . Asthma   . Chronic back pain   . Sciatica   . Hyponatremia   . Stroke [facility])     seen on CT scan in 2015  . Episodic lightheadedness     since cerebral aneurysm repair 10/2013  . Atrial fibrillation ([place])   . S/P mitral valve replacement with metallic valve 24/26/8341  . S/P aortic valve replacement with metallic valve 96/22/2979    [facility] 1995  . 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 Left coronary system traverses lateral to pulmonary artery.     Past Surgical History  Procedure Laterality Date  . Brain surgery      aneurysm stent placed 2015  . Cardiac surgery    . Cholecystectomy    . Appendectomy    . Cataract extraction    . Cardiac catheterization N/A 01/09/2015    Procedure: Left Heart Cath and Coronary Angiography;  Surgeon: Troy Sine, MD;  Location: [facility];  Service: Cardiovascular;  Laterality: N/A;  Family History:  Family History  Problem Relation Age of Onset  . Cancer Sister   . Cancer Brother   . Kidney failure Father    Family Psychiatric  History: See above Social History:  History  Alcohol Use No     History  Drug Use No    Social History   Social History  . Marital Status: Married    Spouse Name: N/A  . Number of Children: N/A  . Years of  Education: N/A   Social History Main Topics  . Smoking status: Former Smoker -- 1.00 packs/day for 40 years    Types: Cigarettes    Quit date: 01/07/2015  . Smokeless tobacco: Never Used  . Alcohol Use: No  . Drug Use: No  . Sexual Activity: Not Asked   Other Topics Concern  . None   Social History Narrative   Additional Social History:    History of alcohol / drug use?: Yes Longest period of sobriety (when/how long): 30 Days  Negative Consequences of Use: Financial, Personal relationships Withdrawal Symptoms: Agitation, Irritability, Weakness, Sweats Name of Substance 1: Hydrocodone 1 - Age of First Use: 61 1 - Amount (size/oz): 28m 1 - Frequency: Daily 1 - Duration: 5 years 1 - Last Use / Amount: 30 Days ago                   Sleep: Fair  Appetite:  Fair  Current Medications: Current Facility-Administered Medications  Medication Dose Route Frequency Provider Last Rate Last Dose  . acetaminophen (TYLENOL) tablet 650 mg  650 mg Oral Q6H PRN LNiel Hummer NP      . albuterol (PROVENTIL HFA;VENTOLIN HFA) 108 (90 Base) MCG/ACT inhaler 1 puff  1 puff Inhalation Q6H PRN LNiel Hummer NP      . alum & mag hydroxide-simeth (MAALOX/MYLANTA) 200-200-20 MG/5ML suspension 30 mL  30 mL Oral Q4H PRN LNiel Hummer NP      . digoxin (Fonnie [place] tablet 0.25 mg  0.25 mg Oral Daily LNiel Hummer NP   0.25 mg at 03/31/15 0818  . enoxaparin (LOVENOX) injection 60 mg  1 mg/kg Subcutaneous Q12H MJulien Girt RPH   60 mg at 03/31/15 2043  . fluticasone (FLONASE) 50 MCG/ACT nasal spray 1 spray  1 spray Each Nare Daily LNiel Hummer NP   1 spray at 03/31/15 0819  . hydrOXYzine (ATARAX/VISTARIL) tablet 25 mg  25 mg Oral Q6H PRN LNiel Hummer NP   25 mg at 03/31/15 2046  . levothyroxine (SYNTHROID, LEVOTHROID) tablet 50 mcg  50 mcg Oral QAC breakfast LNiel Hummer NP   50 mcg at 04/01/15 0616  . loperamide (IMODIUM) capsule 2-4 mg  2-4 mg Oral PRN LNiel Hummer NP      . LORazepam  (ATIVAN) tablet 1 mg  1 mg Oral Q6H PRN LNiel Hummer NP   1 mg at 03/31/15 2046  . LORazepam (ATIVAN) tablet 1 mg  1 mg Oral TID LNiel Hummer NP   1 mg at 03/31/15 1738   Followed by  . [START ON 04/02/2015] LORazepam (ATIVAN) tablet 1 mg  1 mg Oral BID LNiel Hummer NP       Followed by  . [START ON 04/03/2015] LORazepam (ATIVAN) tablet 1 mg  1 mg Oral Daily LNiel Hummer NP      . losartan (COZAAR) tablet 50 mg  50 mg Oral Daily TDerrill Center NP   50 mg at  03/31/15 0818  . magnesium hydroxide (MILK OF MAGNESIA) suspension 30 mL  30 mL Oral Daily PRN Niel Hummer, NP      . metoprolol tartrate (LOPRESSOR) tablet 12.5 mg  12.5 mg Oral BID Niel Hummer, NP   12.5 mg at 03/31/15 1738  . multivitamin with minerals tablet 1 tablet  1 tablet Oral Daily Niel Hummer, NP   1 tablet at 03/31/15 0816  . nicotine (NICODERM CQ - dosed in mg/24 hours) patch 14 mg  14 mg Transdermal Daily Niel Hummer, NP   14 mg at 03/31/15 1761  . ondansetron (ZOFRAN-ODT) disintegrating tablet 4 mg  4 mg Oral Q6H PRN Niel Hummer, NP   4 mg at 03/31/15 0616  . pantoprazole (PROTONIX) EC tablet 20 mg  20 mg Oral Daily Niel Hummer, NP   20 mg at 03/31/15 0816  . sertraline (ZOLOFT) tablet 25 mg  25 mg Oral Daily Niel Hummer, NP   25 mg at 03/31/15 0816  . traZODone (DESYREL) tablet 50 mg  50 mg Oral QHS,MR X 1 Derrill Center, NP   50 mg at 03/31/15 2046  . umeclidinium bromide (INCRUSE ELLIPTA) 62.5 MCG/INH 1 puff  1 puff Inhalation Daily Niel Hummer, NP   1 puff at 03/30/15 1816  . Warfarin - Pharmacist Dosing Inpatient   Does not apply q1800 Emiliano Dyer, [facility]        Lab Results:  Results for orders placed or performed during the hospital encounter of 03/30/15 (from the past 48 hour(s))  Comprehensive metabolic panel     Status: Abnormal   Collection Time: 03/30/15  6:44 PM  Result Value Ref Range   Sodium 125 (L) 135 - 145 mmol/L   Potassium 3.9 3.5 - 5.1 mmol/L   Chloride 88 (L) 101 - 111 mmol/L    CO2 25 22 - 32 mmol/L   Glucose, Bld 160 (H) 65 - 99 mg/dL   BUN 13 6 - 20 mg/dL   Creatinine, Ser 0.83 0.44 - 1.00 mg/dL   Calcium 9.5 8.9 - 10.3 mg/dL   Total Protein 8.1 6.5 - 8.1 g/dL   Albumin 5.2 (H) 3.5 - 5.0 g/dL   AST 36 15 - 41 U/L   ALT 20 14 - 54 U/L   Alkaline Phosphatase 72 38 - 126 U/L   Total Bilirubin 2.2 (H) 0.3 - 1.2 mg/dL   GFR calc non Af Amer >60 >60 mL/min   GFR calc Af Amer >60 >60 mL/min    Comment: (NOTE) The eGFR has been calculated using the CKD EPI equation. This calculation has not been validated in all clinical situations. eGFR's persistently <60 mL/min signify possible Chronic Kidney Disease.    Anion gap 12 5 - 15    Comment: Performed at [facility]  TSH     Status: None   Collection Time: 03/30/15  6:44 PM  Result Value Ref Range   TSH 0.980 0.350 - 4.500 uIU/mL    Comment: Performed at [facility]  Ethanol     Status: None   Collection Time: 03/30/15  6:44 PM  Result Value Ref Range   Alcohol, Ethyl (B) <5 <5 mg/dL    Comment:        LOWEST DETECTABLE LIMIT FOR SERUM ALCOHOL IS 5 mg/dL FOR MEDICAL PURPOSES ONLY Performed at [place]     Status: Abnormal   Collection Time: 03/30/15  6:44 PM  Result Value Ref Range   Prothrombin Time 21.0 (H) 11.6 - 15.2 seconds   INR 1.90 (H) 0.00 - 1.49    Comment: Performed at [facility]  Digoxin level     Status: None   Collection Time: 03/30/15  6:44 PM  Result Value Ref Range   Digoxin Level 0.8 0.8 - 2.0 ng/mL    Comment: Performed at [place]     Status: Abnormal   Collection Time: 03/31/15  6:53 AM  Result Value Ref Range   Prothrombin Time 22.1 (H) 11.6 - 15.2 seconds   INR 2.03 (H) 0.00 - 1.49    Comment: Performed at [facility]    Blood Alcohol level:  Lab Results  Component Value Date   [facility] <5 03/30/2015   ETH <5 01/07/2015     Physical Findings: AIMS: Facial and Oral Movements Muscles of Facial Expression: None, normal Lips and Perioral Area: None, normal Jaw: None, normal Tongue: None, normal,Extremity Movements Upper (arms, wrists, hands, fingers): None, normal Lower (legs, knees, ankles, toes): None, normal, Trunk Movements Neck, shoulders, hips: None, normal, Overall Severity Severity of abnormal movements (highest score from questions above): None, normal Incapacitation due to abnormal movements: None, normal Patient's awareness of abnormal movements (rate only patient's report): No Awareness, Dental Status Current problems with teeth and/or dentures?: No Does patient usually wear dentures?: No  CIWA:  CIWA-Ar Total: 3 COWS:     Musculoskeletal: Strength & Muscle Tone: within normal limits Gait & Station: normal Patient leans: N/A  Psychiatric Specialty Exam: Review of Systems  Psychiatric/Behavioral: Positive for depression. Negative for suicidal ideas and hallucinations. The patient is nervous/anxious and has insomnia.   All other systems reviewed and are negative.   Blood pressure 176/73, pulse 64, temperature 98.3 F (36.8 C), temperature source Oral, resp. rate 20, height 5' 7"  (1.702 m), weight 59.648 kg (131 lb 8 oz), SpO2 100 %.Body mass index is 20.59 kg/(m^2).  General Appearance: Fairly Groomed  Engineer, water::  Good  Speech:  Clear and Coherent  Volume:  Normal  Mood:  Anxious, Depressed and Irritable  Affect:  Congruent  Thought Process:  Intact  Orientation:  Full (Time, Place, and Person)  Thought Content:  Rumination with symptoms of worries  Suicidal Thoughts:  No patient denies suicidal ideations at this time  Homicidal Thoughts:  No  Memory:  Immediate;   Fair Recent;   Fair Remote;   Fair  Judgement:  Intact  Insight:  Lacking  Psychomotor Activity:  Restlessness- improving   Concentration:  Poor  Recall:  [place] of Knowledge:Fair  Language: Good   Akathisia:  No  Handed:  Right  AIMS (if indicated):     Assets:  Resilience  ADL's:  Intact  Cognition: WNL  Sleep:  Number of Hours: 4.25     I agree with current treatment plan on 04/01/2015, Patient seen face-to-face for psychiatric evaluation follow-up, chart reviewed. Reviewed the information documented and agree with the treatment plan.  Treatment Plan Summary: Daily contact with patient to assess and evaluate symptoms and progress in treatment and Medication management  Continue Ativan protocol to taper off xanax,  Continue Zoloft 25 mg daily for depressive symptoms, continue home medications for medical condition that was verified using medication bottles brought in by family.  Start with Trazodone 50 mg X1 repeat mg for insomnia Will continue to monitor vitals ,medication compliance and treatment side effects while patient  is here. Pharmacist consult- completed. See consult note.  Dosing titrated with bridge treatment of LovenoxAilene West Pharmacist  Recollect CMP- consider consult for low sodium pending results. Labs: Placed orders for:  Hemoglobin A1c, INR and UDS- pending  Reviewed labs Glucose 160 elevated ,BAL-negative  CSW will start working on disposition.  Patient to participate in therapeutic milieu  Derrill Center, NP 04/01/2015, 9:42 AM   I reviewed chart and agreed with the findings and treatment Plan.  Berniece Andreas, MD

## 2015-04-01 NOTE — Progress Notes (Signed)
Nutrition Brief Note  Patient identified on the Malnutrition Screening Tool (MST) Report  Wt Readings from Last 15 Encounters:  03/30/15 131 lb 8 oz (59.648 kg)  03/15/15 134 lb (60.782 kg)  01/13/15 130 lb (58.968 kg)  06/07/14 140 lb (63.504 kg)  05/13/14 140 lb (63.504 kg)  11/11/13 132 lb (59.875 kg)    Body mass index is 20.59 kg/(m^2). Patient meets criteria for normal range based on current BMI.   Diet Order: Diet Heart Room service appropriate?: Yes; Fluid consistency:: Thin Pt is also offered choice of unit snacks mid-morning and mid-afternoon.  Pt is eating as desired.   Labs and medications reviewed.   No nutrition interventions warranted at this time. If nutrition issues arise, please consult RD.   Tilda FrancoLindsey Mckay Brandt, MS, RD, LDN Pager: [phone] After Hours Pager: [phone]

## 2015-04-01 NOTE — Progress Notes (Signed)
D.  Pt pleasant on approach, questioned the results of her PT-INR and dosage of Coumadin.  Pt states that she normally takes 7.5 mg.  Explained importance of morning blood draw to Pt and she verbalized understanding.  Pt did attend most of AA group but left 10 minutes early to receive her night time medications so that she could go to bed.  Pt denies SI/HI/hallucinations at this time. Pt is interacting appropriately with peers on the unit.  A.  Support and encouragement offered, medications given as ordered.  R.  Pt remains safe on the unit, will continue to monitor.

## 2015-04-01 NOTE — BHH Counselor (Signed)
Adult Comprehensive Assessment  Patient ID: Jasmine West, female   DOB: November 21, 1945, 70 y.o.   MRN: [id number]  Information Source: Information source: Patient  Current Stressors:  Educational / Learning stressors: Denies stressors Employment / Job issues: Denies stressors Family Relationships: Denies stressors, saying "not reallyEngineer, petroleum / Lack of resources (include bankruptcy): Denies stressors Housing / Lack of housing: Denies stressors, "can be stressful to keep things going."  States it has been hard since she quit working, cannot keep things like she used to and the daily routine is hard. Physical health (include injuries & life threatening diseases): Has physical/medical issues that are stressful - states she does not really know what, but "we're right in the middle of it."  Has had a brain aneurysm, COPD, brain stents. Social relationships: Denies stressors Substance abuse: Has been abusing Lortabs, buying them from a neighbor, "getting over that." Bereavement / Loss: Denies stressors  Living/Environment/Situation:  Living Arrangements: Spouse/significant other (Husband) Living conditions (as described by patient or guardian): Good, safe, comfortable How long has patient lived in current situation?: Just her and husband since son left home 33 years ago What is atmosphere in current home: Comfortable, Paramedic, Supportive  Family History:  Marital status: Married Number of Years Married: 34 What types of issues is patient dealing with in the relationship?: "He's a good man, but a hard man in a way.  He should be more sensitive, but he's not abusive." Are you sexually active?: Yes What is your sexual orientation?: Straight Has your sexual activity been affected by drugs, alcohol, medication, or emotional stress?: None Does patient have children?: Yes How many children?: 1 How is patient's relationship with their children?: 52yo son - relationship is great, lives next  door  Childhood History:  By whom was/is the patient raised?: Both parents Description of patient's relationship with caregiver when they were a child: Relationship with parents could not have been better, "Christian home." Patient's description of current relationship with people who raised him/her: Both are deceased. How were you disciplined when you got in trouble as a child/adolescent?: Switches Does patient have siblings?: Yes Number of Siblings: 3 Description of patient's current relationship with siblings: 2 sisters and 1 brother.  Only 1 sister remains living.  They have a great relationship, and she lives locally. Did patient suffer any verbal/emotional/physical/sexual abuse as a child?: No Did patient suffer from severe childhood neglect?: No Has patient ever been sexually abused/assaulted/raped as an adolescent or adult?: No Was the patient ever a victim of a crime or a disaster?: No Witnessed domestic violence?: No Has patient been effected by domestic violence as an adult?: No  Education:  Highest grade of school patient has completed: Got her GED Currently a Consulting civil engineer?: No Name of school: NA Contact person: NA Learning disability?: No  Employment/Work Situation:   Employment situation: On disability (Retired) Why is patient on disability: 2 artificial heart valves and COPD How long has patient been on disability: Since 204 What is the longest time patient has a held a job?: 45 years 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 Has patient ever been in the [employer]?: No Has patient ever served in combat?: No Did You Receive Any Psychiatric Treatment/Services While in Equities trader?: No Are There Guns or Other Weapons in Your Home?: Yes Types of Guns/Weapons: Handgun kept on husband's side of bed; double barrel shotgun; and a "30 ought 6" Are These Weapons Safely Secured?: No Who Could Verify You Are Able To Have These  Secured:: Husband Brennan BaileyDavid Bieser - She  states they are "wrapped up in a closet."  Financial Resources:   Financial resources: [employer] Corporationeceives SSDI, Income from spouse, Medicare Does patient have a Lawyerrepresentative payee or guardian?: No  Alcohol/Substance Abuse:   What has been your use of drugs/alcohol within the last 12 months?: Pain pills 3-4 times a day for the last 2 years If attempted suicide, did drugs/alcohol play a role in this?: No Alcohol/Substance Abuse Treatment Hx: Denies past history Has alcohol/substance abuse ever caused legal problems?: No  Social Support System:   Conservation officer, natureatient's Community Support System: Fair Museum/gallery exhibitions officerDescribe Community Support System: Family, neighbors, friends Type of faith/religion: Ephriam KnucklesChristian How does patient's faith help to cope with current illness?: "A great deal - God gives me strength, I know God loves me and is always going to be there for me.  It's me I don't like that much right now."  Leisure/Recreation:   Leisure and Hobbies: Rode a motorcycle until last year, then quit.  Fishes a little bit in a pond on their property, sits on back porch, watches TV.  Strengths/Needs:   What things does the patient do well?: Paying the bills, keeping the house until lately, used to be a good cook, being a wife, being a mother. In what areas does patient struggle / problems for patient: "I seem to be in a slump.  I don't know how to answer that."  Discharge Plan:   Does patient have access to transportation?: Yes Will patient be returning to same living situation after discharge?: Yes Currently receiving community mental health services: No If no, would patient like referral for services when discharged?: Yes (What county?) [facility]([place]) Does patient have financial barriers related to discharge medications?: No  Summary/Recommendations:   Summary and Recommendations (to be completed by the evaluator): Patient is a 70yo female admitted to the hospital with suicidal ideation and pain medication abuse.  She  reports primary trigger for admission was holding a gun in bedroom which husband took away from her, due to not wanting to be a burden any longer.  Patient will benefit from crisis stabilization, medication evaluation, group therapy and psychoeducation, in addition to case management for discharge planning. At discharge it is recommended that Patient adhere to the established discharge plan and continue in treatment.  Sarina SerGrossman-Orr, Yaxiel Minnie Jo. 04/01/2015 1:48 PM

## 2015-04-02 LAB — HEMOGLOBIN A1C
Hgb A1c MFr Bld: 5.6 % (ref 4.8–5.6)
MEAN PLASMA GLUCOSE: 114 mg/dL

## 2015-04-02 LAB — RAPID URINE DRUG SCREEN, HOSP PERFORMED
AMPHETAMINES: NOT DETECTED
Barbiturates: NOT DETECTED
Benzodiazepines: POSITIVE — AB
Cocaine: NOT DETECTED
OPIATES: NOT DETECTED
Tetrahydrocannabinol: NOT DETECTED

## 2015-04-02 LAB — URINALYSIS, ROUTINE W REFLEX MICROSCOPIC
Bilirubin Urine: NEGATIVE
GLUCOSE, UA: NEGATIVE mg/dL
HGB URINE DIPSTICK: NEGATIVE
KETONES UR: NEGATIVE mg/dL
LEUKOCYTES UA: NEGATIVE
Nitrite: NEGATIVE
PROTEIN: NEGATIVE mg/dL
Specific Gravity, Urine: 1.008 (ref 1.005–1.030)
pH: 7.5 (ref 5.0–8.0)

## 2015-04-02 LAB — BASIC METABOLIC PANEL
ANION GAP: 7 (ref 5–15)
BUN: 10 mg/dL (ref 6–20)
CALCIUM: 9.2 mg/dL (ref 8.9–10.3)
CO2: 28 mmol/L (ref 22–32)
Chloride: 90 mmol/L — ABNORMAL LOW (ref 101–111)
Creatinine, Ser: 0.81 mg/dL (ref 0.44–1.00)
GFR calc Af Amer: >60 mL/min (ref >60)
GLUCOSE: 102 mg/dL — AB (ref 65–99)
POTASSIUM: 4.4 mmol/L (ref 3.5–5.1)
SODIUM: 125 mmol/L — AB (ref 135–145)

## 2015-04-02 LAB — PROTIME-INR
INR: 2.24 — AB (ref 0.00–1.49)
PROTHROMBIN TIME: 24.6 s — AB (ref 11.6–15.2)

## 2015-04-02 MED ORDER — LORAZEPAM 1 MG PO TABS
1.0000 mg | ORAL_TABLET | Freq: Four times a day (QID) | ORAL | Status: DC | PRN
  Administered 2015-04-02 – 2015-04-04 (×5): 1 mg via ORAL
  Filled 2015-04-02 (×5): qty 1

## 2015-04-02 MED ORDER — WARFARIN SODIUM 10 MG PO TABS
10.0000 mg | ORAL_TABLET | Freq: Once | ORAL | Status: AC
  Administered 2015-04-02: 10 mg via ORAL
  Filled 2015-04-02: qty 1

## 2015-04-02 MED ORDER — LORAZEPAM 1 MG PO TABS
1.0000 mg | ORAL_TABLET | Freq: Four times a day (QID) | ORAL | Status: DC | PRN
  Administered 2015-04-02: 1 mg via ORAL

## 2015-04-02 MED ORDER — GABAPENTIN 100 MG PO CAPS
100.0000 mg | ORAL_CAPSULE | Freq: Three times a day (TID) | ORAL | Status: DC
  Administered 2015-04-02: 100 mg via ORAL
  Filled 2015-04-02 (×6): qty 1

## 2015-04-02 MED ORDER — BOOST / RESOURCE BREEZE PO LIQD
1.0000 | Freq: Three times a day (TID) | ORAL | Status: DC
Start: 2015-04-02 — End: 2015-04-04
  Administered 2015-04-02 – 2015-04-04 (×2): 1 via ORAL
  Filled 2015-04-02 (×13): qty 1

## 2015-04-02 MED ORDER — HYDROXYZINE HCL 25 MG PO TABS
25.0000 mg | ORAL_TABLET | Freq: Four times a day (QID) | ORAL | Status: DC | PRN
  Administered 2015-04-03: 25 mg via ORAL
  Filled 2015-04-02: qty 1

## 2015-04-02 MED ORDER — SERTRALINE HCL 50 MG PO TABS
50.0000 mg | ORAL_TABLET | Freq: Every day | ORAL | Status: DC
  Administered 2015-04-03 – 2015-04-04 (×2): 50 mg via ORAL
  Filled 2015-04-02 (×4): qty 1

## 2015-04-02 NOTE — Progress Notes (Signed)
Patient ID: Jasmine PattersonMary West, female   DOB: 04/30/1945, 70 y.o.   MRN: [id number]   Pt currently presents with a flat affect and anxious behavior. Per self inventory, pt rates depression at a 4, hopelessness 9 and anxiety 6. Pt's daily goal is to "get shower, build strength, walk more" and they intend to do so by "anything, what ever it takes to give it my best" Pt reports poor sleep, a poor appetite, low energy and good concentration. Pt reports increased anxiety this morning, pt given a CIWA of 12. Pt is also very tremulous with moderate itching. These signs and symptoms decrease with prn administration, MD notified.   Pt provided with scheduled and prn medications per providers orders. Pt's labs and vitals were monitored throughout the day. Pt supported emotionally and encouraged to express concerns and questions. Pt educated on medications.  Pt's safety ensured with 15 minute and environmental checks. Pt currently denies SI/HI and A/V hallucinations. Pt verbally agrees to seek staff if SI/HI or A/VH occurs and to consult with staff before acting on these thoughts. Pt wishes to be taken off of Neurontin, requests phenergan before dinner.Will continue POC.

## 2015-04-02 NOTE — BHH Group Notes (Signed)
Adult Psychoeducational Group Note  Date:  04/02/2015 Time:  9:15 PM  Group Topic/Focus:  Wrap-Up Group:   The focus of this group is to help patients review their daily goal of treatment and discuss progress on daily workbooks.  Participation Level:  Minimal  Participation Quality:  Appropriate  Affect:  Appropriate  Cognitive:  Appropriate  Insight: Good  Engagement in Group:  Engaged  Modes of Intervention:  Discussion  Additional Comments:  Pt ranked her day an 8 out 10.  She ranked her day that because she "made myself get up , move around and have a different attitude".  Pt stated she is discharging on Wednesday.  Caroll RancherLindsay, Kaevion Sinclair A 04/02/2015, 9:15 PM

## 2015-04-02 NOTE — BHH Group Notes (Signed)
BHH LCSW Group Therapy  04/02/2015 2:39 PM  Type of Therapy:  Group Therapy  Participation Level:  Did Not Attend-pt was meeting with MD. Excused.   Modes of Intervention:  Confrontation, Discussion, Education, Exploration, Problem-solving, Rapport Building, Socialization and Support  Summary of Progress/Problems: Today's Topic: Overcoming Obstacles. Patients identified one short term goal and potential obstacles in reaching this goal. Patients processed barriers involved in overcoming these obstacles. Patients identified steps necessary for overcoming these obstacles and explored motivation (internal and external) for facing these difficulties head on.   Smart, Vir Whetstine LCSW 04/02/2015, 2:39 PM

## 2015-04-02 NOTE — [facility] Group Notes (Signed)
[facility]   04/02/2015 9:11 AM  Participation Quality:  Invited. DID NOT ATTEND. Pt chose to remain in bed.   Smart, Yulanda Diggs LCSW

## 2015-04-02 NOTE — Progress Notes (Signed)
[facility] MD Progress Note  04/02/2015 5:06 PM Jasmine West  MRN:  [id number] Subjective:  Jasmine West is trying to get her life back together. States that she is still having a lot of anxiety. States she started using some klonopin and then she asked to be switched to Xanax. She states she had to take more and more Xanax as it quit working for her. States she started getting overwhelmed and would get up in the AM  and not do the things she was supposed to do, she started using the opioids to give her energy to function. States she was spending up to 600 per month. " A friend" supplied her with the opioids. As she moves forward she does not know how she is going to make it without anxiety medications. She states she cant stop worrying. She is now ruminating about her husband having to have back surgery and if she is going to be able to take of him. Principal Problem: MDD (major depressive disorder), recurrent episode, severe (HCC) Diagnosis:   Patient Active Problem List   Diagnosis Date Noted  . Severe episode of recurrent major depressive disorder, without psychotic features (HCC) [F33.2]   . 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 MDD (major depressive disorder), recurrent episode, severe (HCC) [F33.2] 03/30/2015  . Opiate addiction (HCC) [F11.20] 03/30/2015  . GAD (generalized anxiety disorder) [F41.1] 03/30/2015  . Abnormal findings on cardiac catheterization [R93.1]   . 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 NSTEMI (non-ST elevated myocardial infarction) (HCC) [I21.4] 01/09/2015  . Hypertension [I10] 01/08/2015  . 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 Atrial fibrillation (HCC) [I48.91] 01/08/2015  . Subtherapeutic international normalized ratio (INR) [R79.1]   . Rectal bleeding [K62.5] 05/13/2014  . Upper GI bleed [K92.2] 05/13/2014  . Tobacco use [Z72.0] 05/13/2014  . Elevated  troponin [R79.89] 05/13/2014  . Leukocytosis [D72.829] 05/13/2014  . Hyponatremia [E87.1] 11/11/2013  . Hypothyroidism [E03.9] 11/11/2013  . Chronic back pain [M54.9, G89.29] 11/11/2013  . COPD (chronic obstructive pulmonary disease) (HCC) [J44.9] 11/11/2013  . Aneurysm, cerebral [I67.1] 11/11/2013   Total Time spent with patient: 20 minutes  Past Psychiatric History: see admission H and P  Past Medical History:  Past Medical History  Diagnosis Date  . Brain aneurysm 10/20/13    3 on lt side and 1 on the right, most recent Oct 1  . Thyroid disease   . Hypertension   . COPD (chronic obstructive pulmonary disease) (HCC)   . Asthma   . Chronic back pain   . Sciatica   . Hyponatremia   . Stroke (HCC)     seen on CT scan in 2015  . Episodic lightheadedness     since cerebral aneurysm repair 10/2013  . Atrial fibrillation (HCC)   . S/P mitral valve replacement with metallic valve 01/08/2015  . S/P aortic valve replacement with metallic valve 01/09/2015    [facility]  . 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 Left coronary system traverses lateral to pulmonary artery.     Past Surgical History  Procedure Laterality Date  . Brain surgery      aneurysm stent placed 2015  . Cardiac surgery    . Cholecystectomy    . Appendectomy    . Cataract extraction    . Cardiac catheterization N/A 01/09/2015      Procedure: Left Heart Cath and Coronary Angiography;  Surgeon: Thomas A Kelly, MD;  Location: [facility];  Service: Cardiovascular;  Laterality: N/A;   Family History:  Family History  Problem Relation Age of Onset  . Cancer Sister   . Cancer Brother   . Kidney failure Father    Family Psychiatric  History: see admission H and P Social History:  History  Alcohol Use No     History  Drug Use No    Social History   Social History  . Marital Status: Married    Spouse Name: N/A  . Number of Children:  N/A  . Years of Education: N/A   Social History Main Topics  . Smoking status: Former Smoker -- 1.00 packs/day for 40 years    Types: Cigarettes    Quit date: 01/07/2015  . Smokeless tobacco: Never Used  . Alcohol Use: No  . Drug Use: No  . Sexual Activity: Not Asked   Other Topics Concern  . None   Social History Narrative   Additional Social History:    History of alcohol / drug use?: Yes Longest period of sobriety (when/how long): 30 Days  Negative Consequences of Use: Financial, Personal relationships Withdrawal Symptoms: Agitation, Irritability, Weakness, Sweats Name of Substance 1: Hydrocodone 1 - Age of First Use: 61 1 - Amount (size/oz): 10mg 1 - Frequency: Daily 1 - Duration: 5 years 1 - Last Use / Amount: 30 Days ago                   Sleep: Fair  Appetite:  Poor  Current Medications: Current Facility-Administered Medications  Medication Dose Route Frequency Provider Last Rate Last Dose  . acetaminophen (TYLENOL) tablet 650 mg  650 mg Oral Q6H PRN Laura A Davis, NP      . albuterol (PROVENTIL HFA;VENTOLIN HFA) 108 (90 Base) MCG/ACT inhaler 1 puff  1 puff Inhalation Q6H PRN Laura A Davis, NP      . alum & mag hydroxide-simeth (MAALOX/MYLANTA) 200-200-20 MG/5ML suspension 30 mL  30 mL Oral Q4H PRN Laura A Davis, NP   30 mL at 04/02/15 1700  . digoxin (LANOXIN) tablet 0.25 mg  0.25 mg Oral Daily Laura A Davis, NP   0.25 mg at 04/02/15 0756  . feeding supplement (BOOST / RESOURCE BREEZE) liquid 1 Container  1 Container Oral TID BM Irving A Lugo, MD   1 Container at 04/02/15 1308  . fluticasone (FLONASE) 50 MCG/ACT nasal spray 1 spray  1 spray Each Nare Daily Laura A Davis, NP   1 spray at 04/02/15 0757  . hydrOXYzine (ATARAX/VISTARIL) tablet 25 mg  25 mg Oral Q6H PRN Irving A Lugo, MD      . levothyroxine (SYNTHROID, LEVOTHROID) tablet 50 mcg  50 mcg Oral QAC breakfast Laura A Davis, NP   50 mcg at 04/02/15 0602  . [START ON 04/03/2015] LORazepam (ATIVAN)  tablet 1 mg  1 mg Oral Daily Laura A Davis, NP      . LORazepam (ATIVAN) tablet 1 mg  1 mg Oral Q6H PRN Irving A Lugo, MD      . losartan (COZAAR) tablet 50 mg  50 mg Oral Daily Tanika N Lewis, NP   50 mg at 04/02/15 0754  . magnesium hydroxide (MILK OF MAGNESIA) suspension 30 mL  30 mL Oral Daily PRN Laura A Davis, NP      . metoprolol tartrate (LOPRESSOR) tablet 12.5 mg  12.5 mg Oral BID Laura A Davis,   NP   12.5 mg at 04/02/15 1701  . multivitamin with minerals tablet 1 tablet  1 tablet Oral Daily Niel Hummer, NP   1 tablet at 04/02/15 0754  . nicotine (NICODERM CQ - dosed in mg/24 hours) patch 14 mg  14 mg Transdermal Daily Niel Hummer, NP   14 mg at 04/02/15 0753  . pantoprazole (PROTONIX) EC tablet 20 mg  20 mg Oral Daily Nicholaus Bloom, MD   20 mg at 04/02/15 0754  . [START ON 04/03/2015] sertraline (ZOLOFT) tablet 50 mg  50 mg Oral Daily Nicholaus Bloom, MD      . traZODone (DESYREL) tablet 50 mg  50 mg Oral QHS,MR X 1 Derrill Center, NP   50 mg at 04/01/15 2200  . umeclidinium bromide (INCRUSE ELLIPTA) 62.5 MCG/INH 1 puff  1 puff Inhalation Daily Niel Hummer, NP   1 puff at 04/02/15 0757  . warfarin (COUMADIN) tablet 10 mg  10 mg Oral ONCE-1800 Nicholaus Bloom, MD      . Warfarin - Pharmacist Dosing Inpatient   Does not apply q1800 Emiliano Dyer, [facility]        Lab Results:  Results for orders placed or performed during the hospital encounter of 03/30/15 (from the past 48 hour(s))  Hemoglobin A1c     Status: None   Collection Time: 03/31/15  6:24 PM  Result Value Ref Range   Hgb A1c MFr Bld 5.6 4.8 - 5.6 %    Comment: (NOTE)         Pre-diabetes: 5.7 - 6.4         Diabetes: >6.4         Glycemic control for adults with diabetes: <7.0    Mean Plasma Glucose 114 mg/dL    Comment: (NOTE) Performed At: [facility] [place], [place] [id number] Lindon Romp MD FH:[id number] Performed at [place]     Status: Abnormal    Collection Time: 04/01/15  6:23 PM  Result Value Ref Range   Prothrombin Time 25.6 (H) 11.6 - 15.2 seconds   INR 2.36 (H) 0.00 - 1.49    Comment: Performed at [place]     Status: Abnormal   Collection Time: 04/01/15  7:23 PM  Result Value Ref Range   Prothrombin Time 25.8 (H) 11.6 - 15.2 seconds   INR 2.40 (H) 0.00 - 1.49    Comment: Performed at [place]     Status: Abnormal   Collection Time: 04/02/15  6:10 AM  Result Value Ref Range   Prothrombin Time 24.6 (H) 11.6 - 15.2 seconds   INR 2.24 (H) 0.00 - 1.49    Comment: Performed at [place] metabolic panel     Status: Abnormal   Collection Time: 04/02/15  6:10 AM  Result Value Ref Range   Sodium 125 (L) 135 - 145 mmol/L    Comment: REPEATED TO VERIFY   Potassium 4.4 3.5 - 5.1 mmol/L   Chloride 90 (L) 101 - 111 mmol/L    Comment: REPEATED TO VERIFY   CO2 28 22 - 32 mmol/L    Comment: REPEATED TO VERIFY   Glucose, Bld 102 (H) 65 - 99 mg/dL   BUN 10 6 - 20 mg/dL   Creatinine, Ser 0.81 0.44 - 1.00 mg/dL   Calcium 9.2 8.9 - 10.3 mg/dL   GFR calc non  Af Amer >60 >60 mL/min   GFR calc Af Amer >60 >60 mL/min    Comment: (NOTE) The eGFR has been calculated using the CKD EPI equation. This calculation has not been validated in all clinical situations. eGFR's persistently <60 mL/min signify possible Chronic Kidney Disease.    Anion gap 7 5 - 15    Comment: REPEATED TO VERIFY Performed at [facility]     Blood Alcohol level:  Lab Results  Component Value Date   ETH <5 03/30/2015   ETH <5 01/07/2015    Physical Findings: AIMS: Facial and Oral Movements Muscles of Facial Expression: None, normal Lips and Perioral Area: None, normal Jaw: None, normal Tongue: None, normal,Extremity Movements Upper (arms, wrists, hands, fingers): None, normal Lower (legs, knees, ankles, toes): None, normal, Trunk  Movements Neck, shoulders, hips: None, normal, Overall Severity Severity of abnormal movements (highest score from questions above): None, normal Incapacitation due to abnormal movements: None, normal Patient's awareness of abnormal movements (rate only patient's report): No Awareness, Dental Status Current problems with teeth and/or dentures?: No Does patient usually wear dentures?: No  CIWA:  CIWA-Ar Total: 5 COWS:  COWS Total Score: 1  Musculoskeletal: Strength & Muscle Tone: within normal limits Gait & Station: normal Patient leans: normal  Psychiatric Specialty Exam: Review of Systems  Constitutional: Positive for malaise/fatigue.  HENT: Negative.   Eyes: Negative.   Respiratory: Negative.   Cardiovascular: Negative.   Gastrointestinal: Positive for heartburn.  Genitourinary: Negative.   Musculoskeletal: Negative.   Skin: Negative.   Neurological: Negative.   Endo/Heme/Allergies: Negative.   Psychiatric/Behavioral: Positive for depression and substance abuse. The patient is nervous/anxious.     Blood pressure 147/72, pulse 85, temperature 97.9 F (36.6 C), temperature source Oral, resp. rate 16, height 5' 7" (1.702 m), weight 59.648 kg (131 lb 8 oz), SpO2 100 %.Body mass index is 20.59 kg/(m^2).  General Appearance: Fairly Groomed  Eye Contact::  Fair  Speech:  Clear and Coherent  Volume:  Normal  Mood:  Anxious and Depressed  Affect:  anxious worried  Thought Process:  Coherent and Goal Directed  Orientation:  Full (Time, Place, and Person)  Thought Content:  symptoms events worries concerns  Suicidal Thoughts:  No  Homicidal Thoughts:  No  Memory:  Immediate;   Fair Recent;   Fair Remote;   Fair  Judgement:  Fair  Insight:  Present and Shallow  Psychomotor Activity:  Restlessness  Concentration:  Fair  Recall:  Fair  Fund of Knowledge:Fair  Language: Fair  Akathisia:  No  Handed:  Right  AIMS (if indicated):     Assets:  Desire for Improvement Housing   ADL's:  Intact  Cognition: WNL  Sleep:  Number of Hours: 5.75   Treatment Plan Summary: Daily contact with patient to assess and evaluate symptoms and progress in treatment and Medication management Supportive approach/coping skills 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 She has been on Benzodiazepines for so long that she might require a longer term slow taper. Will reassess Depression-anxiety; will increase the Zoloft to 25 mg daily Will work with CBT/mindfulness/stress management/problem solving ( husband can go into rehab once he has the surgery) Will continue to monitor LUGO,IRVING A, MD 04/02/2015, 5:06 PM  

## 2015-04-02 NOTE — Tx Team (Signed)
Interdisciplinary Treatment Plan Update (Adult)  Date:  04/02/2015  Time Reviewed:  8:34 AM   Progress in Treatment: Attending groups: Intermittently Participating in groups:  Yes. When sh attends  Taking medication as prescribed:  Yes. Tolerating medication:  Yes. Family/Significant othe contact made:  SPE required for pt. Guns in home.  Patient understands diagnosis:  Yes. and As evidenced by:  seeking treatment for SI with plan to shoot self, opiate abuse, depression, and for medication stabilization Discussing patient identified problems/goals with staff:  Yes. Medical problems stabilized or resolved:  Yes. Denies suicidal/homicidal ideation: Yes. Issues/concerns per patient self-inventory:  Other:  Discharge Plan or Barriers: CSW assessing for appropriate referrals. Pt does not have any current mental health providers per admission note.   Reason for Continuation of Hospitalization: Depression Medication stabilization Suicidal ideation  Comments:  Jasmine West is an 70 y.o. female that reports SI with a plan to shot herself with a gun. Patient reports that she was in the bedroom with a loaded gun when her husband came in and took the gun from her. Patient reports that, "I do not want to be a burden to my husband and my son anymore". Patient reports a prior psychiatric hospitalization nine years ago in [place] due to suicidal ideation. Patient's son and husband reports that she has been abusing pain pills for the past 5-6 years. Patient's son reports that she has been purchasing the pain pills from a neighbor. 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 Patient denies HI/Psyschosis. Patient denies prior substance abuse treatment. Patient reports that she has stopped taking the pain pills 30 days ago. Patient denies counseling. Patient reports that she receives her psychiatric medication from her  heart doctor. Patient denies physical, sexual or emotional abuse. Diagnosis: Anxiety Disorder   Estimated length of stay:  3-5 days   New goal(s): to develop effective aftercare plan.   Additional Comments:  Patient and CSW reviewed pt's identified goals and treatment plan. Patient verbalized understanding and agreed to treatment plan. CSW reviewed [facility] "Discharge Process and Patient Involvement" Form. Pt verbalized understanding of information provided and signed form.    Review of initial/current patient goals per problem list:  1. Goal(s): Patient will participate in aftercare plan  Met: No.   Target date: at discharge  As evidenced by: Patient will participate within aftercare plan AEB aftercare provider and housing plan at discharge being identified.  3/13: CSW assessing for appropriate referrals.   2. Goal (s): Patient will exhibit decreased depressive symptoms and suicidal ideations.  Met: No.    Target date: at discharge  As evidenced by: Patient will utilize self rating of depression at 3 or below and demonstrate decreased signs of depression or be deemed stable for discharge by MD.  3/13: Pt rates depression as high. Denies SI/HI/AVH today.   Attendees: Patient:   04/02/2015 8:34 AM   Family:   04/02/2015 8:34 AM   Physician:  Dr. Carlton Adam, MD 04/02/2015 8:34 AM   Nursing:   Berlin Hun RN 04/02/2015 8:34 AM   Clinical Social Worker: Maxie Better, LCSW 04/02/2015 8:34 AM   Clinical Social Worker: Erasmo Downer Drinkard LCSWA; Peri Maris LCSWA 04/02/2015 8:34 AM   Other:  Gerline Legacy Nurse Case Manager 04/02/2015 8:34 AM   Other:    04/02/2015 8:34 AM   Other:   04/02/2015 8:34 AM   Other:  04/02/2015 8:34 AM   Other:  04/02/2015 8:34  AM   Other:  04/02/2015 8:34 AM    04/02/2015 8:34 AM    04/02/2015 8:34 AM    04/02/2015 8:34 AM    04/02/2015 8:34 AM    Scribe for Treatment Team:   Maxie Better, LCSW 04/02/2015 8:34 AM

## 2015-04-02 NOTE — Progress Notes (Addendum)
ANTICOAGULATION CONSULT NOTE -  Pharmacy Consult for Coumadin Indication: valve  Allergies  Allergen Reactions  . Sulfa Antibiotics Photosensitivity    Yellow eyes    Patient Measurements: Height:  (170.2 cm) Weight: 131 lb 8 oz (59.648 kg) IBW/kg (Calculated) : 61.6   Vital Signs: Temp: 97.9 F (36.6 C) (03/13 0753) Temp Source: Oral (03/13 0753) BP: 150/99 mmHg (03/13 1153) Pulse Rate: 82 (03/13 1153)  Labs:  Recent Labs  03/30/15 1844  04/01/15 1823 04/01/15 1923 04/02/15 0610  LABPROT 21.0*  < > 25.6* 25.8* 24.6*  INR 1.90*  < > 2.36* 2.40* 2.24*  CREATININE 0.83  --   --   --  0.81  < > = values in this interval not displayed.  Estimated Creatinine Clearance: 62.5 mL/min (by C-G formula based on Cr of 0.81).   Medical History: Past Medical History  Diagnosis Date  . Brain aneurysm 10/20/13    3 on lt side and 1 on the right, most recent Oct 1  . Thyroid disease   . Hypertension   . COPD (chronic obstructive pulmonary disease) (HCC)   . Asthma   . Chronic back pain   . Sciatica   . Hyponatremia   . Stroke [facility])     seen on CT scan in 2015  . Episodic lightheadedness     since cerebral aneurysm repair 10/2013  . Atrial fibrillation (HCC)   . S/P mitral valve replacement with metallic valve 01/08/2015  . S/P aortic valve replacement with metallic valve 01/09/2015    [facility] 1995  . 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 Left coronary system traverses lateral to pulmonary artery.     Medications:  Prescriptions prior to admission  Medication Sig Dispense Refill Last Dose  . albuterol (PROVENTIL) (2.5 MG/3ML) 0.083% nebulizer solution Take 2.5 mg by nebulization every 6 (six) hours as needed for wheezing or shortness of breath.    unknown  . albuterol (VENTOLIN HFA) 108 (90 BASE) MCG/ACT inhaler Inhale 2 puffs into the lungs every 6 (six) hours as needed for wheezing or shortness of  breath.    unknown  . ALPRAZolam (XANAX) 1 MG tablet Take 1 mg by mouth 3 (three) times daily.    03/29/2015 at Unknown time  . COUMADIN 5 MG tablet Take 5-7.5 mg by mouth at bedtime.  on Mondays, Wednesdays, and Fridays, then take 7.5mg  on all other days   03/29/2015 at Unknown time  . digoxin (LANOXIN) 0.25 MG tablet Take 0.25 mg by mouth every morning.   03/30/2015 at Unknown time  . docusate sodium (COLACE) 100 MG capsule Take 1 capsule (100 mg total) by mouth 2 (two) times daily. (Patient taking differently: Take 100 mg by mouth daily as needed for mild constipation or moderate constipation. ) 10 capsule 0 Past Week at Unknown time  . fluticasone (FLONASE) 50 MCG/ACT nasal spray Place 2 sprays into both nostrils every evening.    03/29/2015 at Unknown time  . furosemide (LASIX) 20 MG tablet Take 20 mg by mouth as needed for fluid.   03/29/2015 at Unknown time  . HYDROcodone-acetaminophen (NORCO) 10-325 MG tablet Take 0.5 tablets by mouth daily as needed for moderate pain or severe pain.   03/29/2015 at Unknown time  . ipratropium-albuterol (DUONEB) 0.5-2.5 (3) MG/3ML SOLN Take 3 mLs by nebulization every 6 (six) hours as needed. (Patient taking differently: Take 3 mLs by nebulization every 6 (  six) hours as needed (for shortness of breath). ) 360 mL  unknown at Unknown time  . levothyroxine (SYNTHROID, LEVOTHROID) 50 MCG tablet Take 50 mcg by mouth at bedtime.    03/30/2015 at Unknown time  . losartan (COZAAR) 50 MG tablet Take 50 mg by mouth every evening.   03/29/2015 at Unknown time  . metoprolol tartrate (LOPRESSOR) 25 MG tablet Take 12.5 mg by mouth 2 (two) times daily.   9 03/30/2015 at Unknown time  . montelukast (SINGULAIR) 10 MG tablet Take 10 mg by mouth every morning.   03/29/2015 at Unknown time  . pantoprazole (PROTONIX) 40 MG tablet Take 40 mg by mouth 2 (two) times daily.   unknown  . Umeclidinium Bromide (INCRUSE ELLIPTA) 62.5 MCG/INH AEPB Inhale 1 puff into the lungs at bedtime.   03/29/2015 at  Unknown time  . nicotine (NICODERM CQ) 7 mg/24hr patch Place 1 patch (7 mg total) onto the skin daily. Start when 14 mg patches have finished (Patient not taking: Reported on 03/30/2015) 14 patch 0 03/15/2015 at Unknown time   Scheduled:  . digoxin  0.25 mg Oral Daily  . feeding supplement  1 Container Oral TID BM  . fluticasone  1 spray Each Nare Daily  . gabapentin  100 mg Oral TID  . levothyroxine  50 mcg Oral QAC breakfast  . LORazepam  1 mg Oral BID   Followed by  . [START ON 04/03/2015] LORazepam  1 mg Oral Daily  . losartan  50 mg Oral Daily  . metoprolol tartrate  12.5 mg Oral BID  . multivitamin with minerals  1 tablet Oral Daily  . nicotine  14 mg Transdermal Daily  . pantoprazole  20 mg Oral Daily  . [START ON 04/03/2015] sertraline  50 mg Oral Daily  . traZODone  50 mg Oral QHS,MR X 1  . umeclidinium bromide  1 puff Inhalation Daily  . warfarin  10 mg Oral ONCE-1800  . Warfarin - Pharmacist Dosing Inpatient   Does not apply q1800    Assessment: INR below goal 2.5-3.5 with today's INR at 2.24.  Patient has no complications noted.  Patient fustrated with bloodwork and injections.  Goal of Therapy:  INR 2-3    Plan:  Coumadin 10 mg x 1 to ensure INR reaches goal of 2.5-3.0.  INR has been just under goal of 2.5 at current dose home dose. May need an increase to 7.5 mg daily on discharge to keep at goal.  Will continue to dose and follow INR daily. Pt/INR   Will dc lovenox for now as INR has been > 2.0 for multiple days and if INR decreases will restart.  Peggye FothergillPayne, Malya Cirillo Marie 04/02/2015,1:52 PM

## 2015-04-02 NOTE — Progress Notes (Signed)
D: Patient in the hallway on approach.  Patient states, "I had a long day."  Patient in states she has felt some nausea at times today.  Patient states has also had anxiety.  Patient denies SI/HI and denies AVH.  Patient states, "I am trying to cope with being off of all the drugs." A: Staff to monitor Q 15 mins for safety.  Encouragement and support offered. Patient refused scheduled Trazodone.   Ativan administered prn for anxiety. R: Patient remains safe on the unit.  Patient attended group tonight.  Patient visible on the unit and interacting with peers.  Patient taking administered medications.

## 2015-04-02 NOTE — Progress Notes (Signed)
Recreation Therapy Notes  Date: 03.13.2017 Time: 9:30am  Location: 300 Hall Group Room   Group Topic: Stress Management  Goal Area(s) Addresses:  Patient will actively participate in stress management techniques presented during session.   Behavioral Response: Did not attend.   Marykay Lexenise L Kshawn Canal, LRT/CTRS        Nolen Lindamood L 04/02/2015 1:39 PM

## 2015-04-02 NOTE — Plan of Care (Signed)
Problem: Alteration in mood & ability to function due to Goal: STG-Patient will attend groups Outcome: Progressing Pt has attended AA groups this weekend     

## 2015-04-03 LAB — PROTIME-INR
INR: 2.23 — AB (ref 0.00–1.49)
PROTHROMBIN TIME: 23.8 s — AB (ref 11.6–15.2)

## 2015-04-03 MED ORDER — WARFARIN SODIUM 10 MG PO TABS
10.0000 mg | ORAL_TABLET | Freq: Once | ORAL | Status: AC
  Administered 2015-04-03: 10 mg via ORAL
  Filled 2015-04-03: qty 1

## 2015-04-03 NOTE — Progress Notes (Signed)
ANTICOAGULATION CONSULT NOTE - Follow Up Consult  Pharmacy Consult for coumadin  Indication:  valve  Allergies  Allergen Reactions  . Sulfa Antibiotics Photosensitivity    Yellow eyes    Patient Measurements: Height: 5\' 7"  (170.2 cm) Weight: 131 lb 8 oz (59.648 kg) IBW/kg (Calculated) : 61.6   Vital Signs: Temp: 98.4 F (36.9 C) (03/14 0621) Temp Source: Oral (03/14 0621) BP: 129/71 mmHg (03/14 0622) Pulse Rate: 92 (03/14 0622)  Labs:  Recent Labs  04/01/15 1923 04/02/15 0610 04/03/15 0615  LABPROT 25.8* 24.6* 23.8*  INR 2.40* 2.24* 2.23*  CREATININE  --  0.81  --     Estimated Creatinine Clearance: 62.5 mL/min (by C-G formula based on Cr of 0.81).   Medications:  Scheduled:  . digoxin  0.25 mg Oral Daily  . feeding supplement  1 Container Oral TID BM  . fluticasone  1 spray Each Nare Daily  . levothyroxine  50 mcg Oral QAC breakfast  . losartan  50 mg Oral Daily  . metoprolol tartrate  12.5 mg Oral BID  . multivitamin with minerals  1 tablet Oral Daily  . nicotine  14 mg Transdermal Daily  . pantoprazole  20 mg Oral Daily  . sertraline  50 mg Oral Daily  . traZODone  50 mg Oral QHS,MR X 1  . umeclidinium bromide  1 puff Inhalation Daily  . warfarin  10 mg Oral ONCE-1800  . Warfarin - Pharmacist Dosing Inpatient   Does not apply q1800    Assessment: INR below goal INR 2.5-3.5 but still above 2.0 at 2.23.  No problems noted   Goal of Therapy:  INR 2.5-3.5   Plan:  Coumadin 10 mg x 1  PT/INR in am    Charyl Dancerayne, Rapheal Masso Marie 04/03/2015,9:03 AM

## 2015-04-03 NOTE — Progress Notes (Signed)
Patient ID: Jasmine West, female   DOB: [birth year], 70 y.o.   MRN: [id number] PER STATE REGULATIONS 482.30  THIS CHART WAS REVIEWED FOR MEDICAL NECESSITY WITH RESPECT TO THE PATIENT'S ADMISSION/ DURATION OF STAY.  NEXT REVIEW DATE:   04/07/2015  Willa RoughJENNIFER JONES Coralyn Roselli, RN, BSN CASE MANAGER

## 2015-04-03 NOTE — Progress Notes (Signed)
D:Patient in bed on approach.  Patient states she was resting and did not feel like getting out of bed. Patient has flat, sad affect.  Patient appeared to have depressed mood. Patient denies SI/HI and denies AVH. A: Staff to monitor Q 15 mins for safety.  Encouragement and support offered.  No scheduled medications administered per orders.  Patient was asleep and Trazodone not administered. R: Patient remains safe on the unit.  Patient did not attend group tonight.  Patient visible on the unit.  No medications administered tonight.

## 2015-04-03 NOTE — Progress Notes (Signed)
Patient did not attend the evening speaker AA meeting. Pt was notified that group was beginning and reported that she did not want to go. Pt remained in bed during group time.

## 2015-04-03 NOTE — Progress Notes (Signed)
Patient ID: Jasmine West, female   DOB: Aug 19, 1945, 70 y.o.   MRN: [id number]  Adult Psychoeducational Group Note  Date:  04/03/2015 Time: 09:00am  Group Topic/Focus:  Self Care:   The focus of this group is to help patients understand the importance of self-care in order to improve or restore emotional, physical, spiritual, interpersonal, and financial health.  Participation Level:  Did Not Attend  Participation Quality: n/a  Affect: n/a  Cognitive: n/a  Insight: n/a  Engagement in Group: n/a  Modes of Intervention:  Activity, Discussion, Education and Support  Additional Comments:  Pt did not attend group, pt in bed asleep.   Aurora Maskwyman, Ladonne Sharples E 04/03/2015, 9:43 AM

## 2015-04-03 NOTE — [facility] Suicide Risk Assessment (Signed)
[facility] INPATIENT:  Family/Significant Other Suicide Prevention Education  Suicide Prevention Education:  Education Completed; Jasmine BaileyDavid Averhart (pt's husband) [phone] has been identified by the patient as the family member/significant other with whom the patient will be residing, and identified as the person(s) who will aid the patient in the event of a mental health crisis (suicidal ideations/suicide attempt).  With written consent from the patient, the family member/significant other has been provided the following suicide prevention education, prior to the and/or following the discharge of the patient.  The suicide prevention education provided includes the following:  Suicide risk factors  Suicide prevention and interventions  National Suicide Hotline telephone number  [facility] assessment telephone number  [facility] Assistance 911  [facility] and/or Residential Mobile Crisis Unit telephone number  Request made of family/significant other to:  Remove weapons (e.g., guns, rifles, knives), all items previously/currently identified as safety concern.    Remove drugs/medications (over-the-counter, prescriptions, illicit drugs), all items previously/currently identified as a safety concern.  The family member/significant other verbalizes understanding of the suicide prevention education information provided.  The family member/significant other agrees to remove the items of safety concern listed above.  Onalee HuaDavid reports that he is putting his guns in locked safe at his son's house. He feels comfortable taking pt home tomorrow and thinks that she is returning to her baseline. She denies SI/HI/AVH.  Smart, Nyellie Yetter LCSW 04/03/2015, 3:56 PM

## 2015-04-03 NOTE — Progress Notes (Signed)
Recreation Therapy Notes  Animal-Assisted Activity (AAA) Program Checklist/Progress Notes Patient Eligibility Criteria Checklist & Daily Group note for Rec Tx Intervention  Date: 03.14.2017 Time: 2:45am [address]    AAA/T Program Assumption of Risk Form signed by Patient/ or Parent Legal Guardian yes  Patient is free of allergies or sever asthma yes  Patient reports no fear of animals yes  Patient reports no history of cruelty to animals yes  Patient understands his/her participation is voluntary yes  Behavioral Response: Did not attend.   Marykay Lexenise L Aldea Avis, LRT/CTRS        Kristi Norment L 04/03/2015 3:04 PM

## 2015-04-03 NOTE — Progress Notes (Signed)
Patient ID: Jasmine PattersonMary West, female   DOB: 04-17-1945, 70 y.o.   MRN: [id number]  Pt currently presents with an anxious affect and behavior. Per self inventory, pt rates depression at a 4, hopelessness 4 and anxiety 5. Pt's daily goal is to "didn't do much work today, just rested" and they intend to do so by "try harder." Pt reports poor sleep, a fair appetite, low energy and good concentration. Pt reports anxiety around starting new medications, pt affect brighter. Signs and symptoms of withdrawal including, nausea, confusion and increased agitation/anxiety still present but improving.   Pt provided with medications per providers orders. Pt's labs and vitals were monitored throughout the day. Pt supported emotionally and encouraged to express concerns and questions. Pt educated on medications.  Pt's safety ensured with 15 minute and environmental checks. Pt currently denies SI/HI and A/V hallucinations. Pt verbally agrees to seek staff if SI/HI or A/VH occurs and to consult with staff before acting on these thoughts. Will continue POC.

## 2015-04-03 NOTE — [facility] Group Notes (Signed)
[facility] LCSW Group Therapy  04/03/2015 1:55 PM  Type of Therapy:  Group Therapy  Participation Level:  Did Not Attend-pt invited. Chose to remain in bed.  Modes of Intervention:  Discussion, Education, Exploration, Problem-solving, Rapport Building and Support  Summary of Progress/Problems: [facility] Speaker came to talk about his personal journey with substance abuse and addiction.   Smart, Rashelle Ireland LCSW 04/03/2015, 1:55 PM

## 2015-04-03 NOTE — Progress Notes (Signed)
[facility] MD Progress Note  04/03/2015 8:36 PM Jasmine West  MRN:  [id number] Subjective:  Jasmine West states she was feeling better yesterday but feels that the Neurontin and the Vistaril we tried made her feel worst. Feels a little down today. States when she got the gun she was not in her right mind. States she would have never hurt herself. States the she still worries about the things that await for her at home once she gets there. She states her husband just told her that he was waiting for her to go to the grocery store. She has used the Ativan successfully when she feels more anxious Principal Problem: MDD (major depressive disorder), recurrent episode, severe (HCC) Diagnosis:   Patient Active Problem List   Diagnosis Date Noted  . Severe episode of recurrent major depressive disorder, without psychotic features (HCC) [F33.2]   . 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 MDD (major depressive disorder), recurrent episode, severe (HCC) [F33.2] 03/30/2015  . Opiate addiction (HCC) [F11.20] 03/30/2015  . GAD (generalized anxiety disorder) [F41.1] 03/30/2015  . Abnormal findings on cardiac catheterization [R93.1]   . 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 NSTEMI (non-ST elevated myocardial infarction) (HCC) [I21.4] 01/09/2015  . Hypertension [I10] 01/08/2015  . 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 Atrial fibrillation (HCC) [I48.91] 01/08/2015  . Subtherapeutic international normalized ratio (INR) [R79.1]   . Rectal bleeding [K62.5] 05/13/2014  . Upper GI bleed [K92.2] 05/13/2014  . Tobacco use [Z72.0] 05/13/2014  . Elevated troponin [R79.89] 05/13/2014  . Leukocytosis [D72.829] 05/13/2014  . Hyponatremia [E87.1] 11/11/2013  . Hypothyroidism [E03.9] 11/11/2013  . Chronic back pain [M54.9, G89.29] 11/11/2013  . COPD (chronic obstructive pulmonary disease) (HCC) [J44.9]  11/11/2013  . Aneurysm, cerebral [I67.1] 11/11/2013   Total Time spent with patient: 30 minutes  Past Psychiatric History: see admission H and P  Past Medical History:  Past Medical History  Diagnosis Date  . Brain aneurysm 10/20/13    3 on lt side and 1 on the right, most recent Oct 1  . Thyroid disease   . Hypertension   . COPD (chronic obstructive pulmonary disease) (HCC)   . Asthma   . Chronic back pain   . Sciatica   . Hyponatremia   . Stroke (HCC)     seen on CT scan in 2015  . Episodic lightheadedness     since cerebral aneurysm repair 10/2013  . Atrial fibrillation (HCC)   . S/P mitral valve replacement with metallic valve 01/08/2015  . S/P aortic valve replacement with metallic valve 01/09/2015    [facility] 1995  . 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 Left coronary system traverses lateral to pulmonary artery.     Past Surgical History  Procedure Laterality Date  . Brain surgery      aneurysm stent placed 2015  . Cardiac surgery    . Cholecystectomy    . Appendectomy    . Cataract extraction    . Cardiac catheterization N/A 01/09/2015    Procedure: Left Heart Cath and Coronary Angiography;  Surgeon: Thomas A Kelly, MD;  Location: [facility] IN[facility];  Service: Cardiovascular;  Laterality: N/A;   Family History:  Family History  Problem Relation Age of Onset  . Cancer Sister   . Cancer Brother   . Kidney failure Father      Family Psychiatric  History: see admission H and P Social History:  History  Alcohol Use No     History  Drug Use No    Social History   Social History  . Marital Status: Married    Spouse Name: N/A  . Number of Children: N/A  . Years of Education: N/A   Social History Main Topics  . Smoking status: Former Smoker -- 1.00 packs/day for 40 years    Types: Cigarettes    Quit date: 01/07/2015  . Smokeless tobacco: Never Used  . Alcohol Use: No  . Drug Use: No  .  Sexual Activity: Not Asked   Other Topics Concern  . None   Social History Narrative   Additional Social History:    History of alcohol / drug use?: Yes Longest period of sobriety (when/how long): 30 Days  Negative Consequences of Use: Financial, Personal relationships Withdrawal Symptoms: Agitation, Irritability, Weakness, Sweats Name of Substance 1: Hydrocodone 1 - Age of First Use: 61 1 - Amount (size/oz): 7m 1 - Frequency: Daily 1 - Duration: 5 years 1 - Last Use / Amount: 30 Days ago                   Sleep: Fair  Appetite:  better  Current Medications: Current Facility-Administered Medications  Medication Dose Route Frequency Provider Last Rate Last Dose  . acetaminophen (TYLENOL) tablet 650 mg  650 mg Oral Q6H PRN LNiel Hummer NP      . albuterol (PROVENTIL HFA;VENTOLIN HFA) 108 (90 Base) MCG/ACT inhaler 1 puff  1 puff Inhalation Q6H PRN LNiel Hummer NP      . alum & mag hydroxide-simeth (MAALOX/MYLANTA) 200-200-20 MG/5ML suspension 30 mL  30 mL Oral Q4H PRN LNiel Hummer NP   30 mL at 04/02/15 1700  . digoxin (LANOXIN) tablet 0.25 mg  0.25 mg Oral Daily LNiel Hummer NP   0.25 mg at 04/03/15 07106 . feeding supplement (BOOST / RESOURCE BREEZE) liquid 1 Container  1 Container Oral TID BM INicholaus Bloom MD   1 Container at 04/02/15 1308  . fluticasone (FLONASE) 50 MCG/ACT nasal spray 1 spray  1 spray Each Nare Daily LNiel Hummer NP   1 spray at 04/03/15 [phone] . levothyroxine (SYNTHROID, LEVOTHROID) tablet 50 mcg  50 mcg Oral QAC breakfast LNiel Hummer NP   50 mcg at 04/03/15 [id number] . LORazepam (ATIVAN) tablet 1 mg  1 mg Oral Q6H PRN INicholaus Bloom MD   1 mg at 04/03/15 1706  . losartan (COZAAR) tablet 50 mg  50 mg Oral Daily TDerrill Center NP   50 mg at 04/03/15 0839  . magnesium hydroxide (MILK OF MAGNESIA) suspension 30 mL  30 mL Oral Daily PRN LNiel Hummer NP      . metoprolol tartrate (LOPRESSOR) tablet 12.5 mg  12.5 mg Oral BID LNiel Hummer NP    12.5 mg at 04/03/15 1706  . multivitamin with minerals tablet 1 tablet  1 tablet Oral Daily LNiel Hummer NP   1 tablet at 04/03/15 [id number] . nicotine (NICODERM CQ - dosed in mg/24 hours) patch 14 mg  14 mg Transdermal Daily LNiel Hummer NP   14 mg at 04/03/15 0837  . pantoprazole (PROTONIX) EC tablet 20 mg  20 mg Oral Daily INicholaus Bloom MD   20 mg at 04/03/15 0839  . sertraline (ZOLOFT) tablet 50 mg  50 mg Oral Daily  Irving A Lugo, MD   50 mg at 04/03/15 0839  . traZODone (DESYREL) tablet 50 mg  50 mg Oral QHS,MR X 1 Tanika N Lewis, NP   50 mg at 04/03/15 0231  . umeclidinium bromide (INCRUSE ELLIPTA) 62.5 MCG/INH 1 puff  1 puff Inhalation Daily Laura A Davis, NP   1 puff at 04/03/15 0837  . Warfarin - Pharmacist Dosing Inpatient   Does not apply q1800 Erin R Williamson, RPH        Lab Results:  Results for orders placed or performed during the hospital encounter of 03/30/15 (from the past 48 hour(s))  Protime-INR     Status: Abnormal   Collection Time: 04/02/15  6:10 AM  Result Value Ref Range   Prothrombin Time 24.6 (H) 11.6 - 15.2 seconds   INR 2.24 (H) 0.00 - 1.49    Comment: Performed at [facility]  Basic metabolic panel     Status: Abnormal   Collection Time: 04/02/15  6:10 AM  Result Value Ref Range   Sodium 125 (L) 135 - 145 mmol/L    Comment: REPEATED TO VERIFY   Potassium 4.4 3.5 - 5.1 mmol/L   Chloride 90 (L) 101 - 111 mmol/L    Comment: REPEATED TO VERIFY   CO2 28 22 - 32 mmol/L    Comment: REPEATED TO VERIFY   Glucose, Bld 102 (H) 65 - 99 mg/dL   BUN 10 6 - 20 mg/dL   Creatinine, Ser 0.81 0.44 - 1.00 mg/dL   Calcium 9.2 8.9 - 10.3 mg/dL   GFR calc non Af Amer >60 >60 mL/min   GFR calc Af Amer >60 >60 mL/min    Comment: (NOTE) The eGFR has been calculated using the CKD EPI equation. This calculation has not been validated in all clinical situations. eGFR's persistently <60 mL/min signify possible Chronic Kidney Disease.    Anion gap 7 5 - 15     Comment: REPEATED TO VERIFY Performed at [facility]   Urine rapid drug screen (hosp performed)not at [facility]     Status: Abnormal   Collection Time: 04/02/15 10:18 AM  Result Value Ref Range   Opiates NONE DETECTED NONE DETECTED   Cocaine NONE DETECTED NONE DETECTED   Benzodiazepines POSITIVE (A) NONE DETECTED   Amphetamines NONE DETECTED NONE DETECTED   Tetrahydrocannabinol NONE DETECTED NONE DETECTED   Barbiturates NONE DETECTED NONE DETECTED    Comment:        DRUG SCREEN FOR MEDICAL PURPOSES ONLY.  IF CONFIRMATION IS NEEDED FOR ANY PURPOSE, NOTIFY LAB WITHIN 5 DAYS.        LOWEST DETECTABLE LIMITS FOR URINE DRUG SCREEN Drug Class       Cutoff (ng/mL) Amphetamine      1000 Barbiturate      200 Benzodiazepine   200 Tricyclics       300 Opiates          300 Cocaine          300 THC              50 Performed at [facility]   Urinalysis, Routine w reflex microscopic (not at [facility])     Status: Abnormal   Collection Time: 04/02/15 10:18 AM  Result Value Ref Range   Color, Urine YELLOW YELLOW   APPearance CLOUDY (A) CLEAR   Specific Gravity, Urine 1.008 1.005 - 1.030   pH 7.5 5.0 - 8.0   Glucose, UA NEGATIVE NEGATIVE mg/dL     Hgb urine dipstick NEGATIVE NEGATIVE   Bilirubin Urine NEGATIVE NEGATIVE   Ketones, ur NEGATIVE NEGATIVE mg/dL   Protein, ur NEGATIVE NEGATIVE mg/dL   Nitrite NEGATIVE NEGATIVE   Leukocytes, UA NEGATIVE NEGATIVE    Comment: MICROSCOPIC NOT DONE ON URINES WITH NEGATIVE PROTEIN, BLOOD, LEUKOCYTES, NITRITE, OR GLUCOSE <1000 mg/dL. Performed at [facility]   Protime-INR     Status: Abnormal   Collection Time: 04/03/15  6:15 AM  Result Value Ref Range   Prothrombin Time 23.8 (H) 11.6 - 15.2 seconds   INR 2.23 (H) 0.00 - 1.49    Comment: Performed at [facility]    Blood Alcohol level:  Lab Results  Component Value Date   ETH <5 03/30/2015   ETH <5 01/07/2015     Physical Findings: AIMS: Facial and Oral Movements Muscles of Facial Expression: None, normal Lips and Perioral Area: None, normal Jaw: None, normal Tongue: None, normal,Extremity Movements Upper (arms, wrists, hands, fingers): None, normal Lower (legs, knees, ankles, toes): None, normal, Trunk Movements Neck, shoulders, hips: None, normal, Overall Severity Severity of abnormal movements (highest score from questions above): None, normal Incapacitation due to abnormal movements: None, normal Patient's awareness of abnormal movements (rate only patient's report): No Awareness, Dental Status Current problems with teeth and/or dentures?: No Does patient usually wear dentures?: No  CIWA:  CIWA-Ar Total: 6 COWS:  COWS Total Score: 1  Musculoskeletal: Strength & Muscle Tone: within normal limits Gait & Station: normal Patient leans: normal  Psychiatric Specialty Exam: Review of Systems  Constitutional: Negative.   HENT: Negative.   Eyes: Negative.   Respiratory: Negative.   Cardiovascular: Negative.   Gastrointestinal: Positive for heartburn.  Genitourinary: Negative.   Musculoskeletal: Negative.   Skin: Negative.   Neurological: Negative.   Endo/Heme/Allergies: Negative.   Psychiatric/Behavioral: Positive for depression. The patient is nervous/anxious.     Blood pressure 165/59, pulse 69, temperature 98.4 F (36.9 C), temperature source Oral, resp. rate 16, height 5' 7" (1.702 m), weight 59.648 kg (131 lb 8 oz), SpO2 100 %.Body mass index is 20.59 kg/(m^2).  General Appearance: proper hygiene   Eye Contact::  Fair  Speech:  Clear and Coherent  Volume:  Normal  Mood:  Anxious  Affect:  Appropriate  Thought Process:  Coherent and Goal Directed  Orientation:  Full (Time, Place, and Person)  Thought Content:  Symptoms events worries concerns  Suicidal Thoughts:  No  Homicidal Thoughts:  No  Memory:  Immediate;   Fair Recent;   Fair Remote;   Fair  Judgement:  Fair   Insight:  Present  Psychomotor Activity:  Normal  Concentration:  Fair  Recall:  Fair  Fund of Knowledge:Fair  Language: Fair  Akathisia:  No  Handed:  Right  AIMS (if indicated):     Assets:  Desire for Improvement Housing  ADL's:  Intact  Cognition: WNL  Sleep:  Number of Hours: 5.25   Treatment Plan Summary: Daily contact with patient to assess and evaluate symptoms and progress in treatment and Medication management Supportive approach/coping skills Depression/anxiety; continue to work with the Zoloft 50 mg daily Acute anxiety; will continue to work with the Ativan on a PRN basis Will continue to work with CBT/mindfulness/strategies to challenge her negative perceptions LUGO,IRVING A, MD 04/03/2015, 8:36 PM  

## 2015-04-04 DIAGNOSIS — F1120 Opioid dependence, uncomplicated: Secondary | ICD-10-CM | POA: Insufficient documentation

## 2015-04-04 LAB — PROTIME-INR
INR: 2.58 — ABNORMAL HIGH (ref 0.00–1.49)
Prothrombin Time: 27.3 seconds — ABNORMAL HIGH (ref 11.6–15.2)

## 2015-04-04 MED ORDER — NICOTINE 7 MG/24HR TD PT24: 7.0000 mg | MEDICATED_PATCH | Freq: Every day | TRANSDERMAL | Status: DC

## 2015-04-04 MED ORDER — PANTOPRAZOLE SODIUM 20 MG PO TBEC: DELAYED_RELEASE_TABLET | ORAL | Status: AC

## 2015-04-04 MED ORDER — LEVOTHYROXINE SODIUM 50 MCG PO TABS: 50.0000 ug | ORAL_TABLET | Freq: Every day | ORAL | Status: AC

## 2015-04-04 MED ORDER — DIGOXIN 250 MCG PO TABS: 0.2500 mg | ORAL_TABLET | Freq: Every morning | ORAL | Status: AC

## 2015-04-04 MED ORDER — LOSARTAN POTASSIUM 50 MG PO TABS: 50.0000 mg | ORAL_TABLET | Freq: Every evening | ORAL | Status: AC

## 2015-04-04 MED ORDER — FLUTICASONE PROPIONATE 50 MCG/ACT NA SUSP: 2.0000 | Freq: Every evening | NASAL | Status: AC

## 2015-04-04 MED ORDER — ALBUTEROL SULFATE (2.5 MG/3ML) 0.083% IN NEBU: 2.5000 mg | INHALATION_SOLUTION | Freq: Four times a day (QID) | RESPIRATORY_TRACT | Status: AC | PRN

## 2015-04-04 MED ORDER — UMECLIDINIUM BROMIDE 62.5 MCG/INH IN AEPB: 1.0000 | INHALATION_SPRAY | Freq: Every day | RESPIRATORY_TRACT | Status: AC

## 2015-04-04 MED ORDER — LORAZEPAM 1 MG PO TABS
1.0000 mg | ORAL_TABLET | Freq: Once | ORAL | Status: AC
  Administered 2015-04-04: 1 mg via ORAL
  Filled 2015-04-04: qty 1

## 2015-04-04 MED ORDER — ALPRAZOLAM 1 MG PO TABS: ORAL_TABLET | ORAL | Status: DC

## 2015-04-04 MED ORDER — SERTRALINE HCL 50 MG PO TABS: 50.0000 mg | ORAL_TABLET | Freq: Every day | ORAL | Status: DC

## 2015-04-04 MED ORDER — METOPROLOL TARTRATE 25 MG PO TABS: 12.5000 mg | ORAL_TABLET | Freq: Two times a day (BID) | ORAL | Status: AC

## 2015-04-04 MED ORDER — COUMADIN 5 MG PO TABS: ORAL_TABLET | ORAL | Status: AC

## 2015-04-04 MED ORDER — TRAZODONE HCL 50 MG PO TABS: 50.0000 mg | ORAL_TABLET | Freq: Every evening | ORAL | Status: DC | PRN

## 2015-04-04 NOTE — [facility] Suicide Risk Assessment (Signed)
[facility]   Principal Problem: MDD (major depressive disorder), recurrent episode, severe (HCC) Discharge Diagnoses:  Patient Active Problem List   Diagnosis Date Noted  . Severe episode of recurrent major depressive disorder, without psychotic features (HCC) [F33.2]   . 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 MDD (major depressive disorder), recurrent episode, severe (HCC) [F33.2] 03/30/2015  . Opiate addiction (HCC) [F11.20] 03/30/2015  . GAD (generalized anxiety disorder) [F41.1] 03/30/2015  . Abnormal findings on cardiac catheterization [R93.1]   . 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 NSTEMI (non-ST elevated myocardial infarction) (HCC) [I21.4] 01/09/2015  . Hypertension [I10] 01/08/2015  . 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 Atrial fibrillation (HCC) [I48.91] 01/08/2015  . Subtherapeutic international normalized ratio (INR) [R79.1]   . Rectal bleeding [K62.5] 05/13/2014  . Upper GI bleed [K92.2] 05/13/2014  . Tobacco use [Z72.0] 05/13/2014  . Elevated troponin [R79.89] 05/13/2014  . Leukocytosis [D72.829] 05/13/2014  . Hyponatremia [E87.1] 11/11/2013  . Hypothyroidism [E03.9] 11/11/2013  . Chronic back pain [M54.9, G89.29] 11/11/2013  . COPD (chronic obstructive pulmonary disease) (HCC) [J44.9] 11/11/2013  . Aneurysm, cerebral [I67.1] 11/11/2013    Total Time spent with patient: 20 minutes  Musculoskeletal: Strength & Muscle Tone: within normal limits Gait & Station: normal Patient leans: normal  Psychiatric Specialty Exam: Review of Systems  Constitutional: Negative.   HENT: Negative.   Eyes: Negative.   Respiratory: Negative.   Cardiovascular: Negative.   Gastrointestinal: Negative.   Genitourinary: Negative.   Musculoskeletal: Negative.   Skin: Negative.   Neurological: Negative.   Endo/Heme/Allergies:  Negative.   Psychiatric/Behavioral: The patient is nervous/anxious.     Blood pressure 183/91, pulse 75, temperature 97.7 F (36.5 C), temperature source Oral, resp. rate 20, height 5\' 7"  (1.702 m), weight 59.648 kg (131 lb 8 oz), SpO2 100 %.Body mass index is 20.59 kg/(m^2).  General Appearance: Fairly Groomed  Patent attorneyye Contact::  Fair  Speech:  Clear and Coherent409  Volume:  Normal  Mood:  Anxious  Affect:  Appropriate  Thought Process:  Coherent and Goal Directed  Orientation:  Full (Time, Place, and Person)  Thought Content:  plans as she moves on  Suicidal Thoughts:  No  Homicidal Thoughts:  No  Memory:  Immediate;   Fair Recent;   Fair Remote;   Fair  Judgement:  Fair  Insight:  Present  Psychomotor Activity:  Normal  Concentration:  Fair  Recall:  FiservFair  Fund of Knowledge:Fair  Language: Fair  Akathisia:  No  Handed:  Right  AIMS (if indicated):     Assets:  Desire for Improvement Housing Social Support  Sleep:  Number of Hours: 5.75  Cognition: WNL  ADL's:  Intact  In full contact with reality. There are no active SI plans or intent. She is still dealing with her chronic anxiety. She is willing to get involved in counseling. Understands that she will take the Ativan just up to 3 times a day and that she will be weaned off after the Zoloft starts working for her Mental Status Per Nursing Assessment::   On Admission:     Demographic Factors:  Age 70 or older and Caucasian  Loss Factors: None identifed  Historical Factors: none identified  Risk Reduction Factors:   Sense of responsibility to family, Living with another person, especially a relative and Positive social support  Continued Clinical Symptoms:  Panic Attacks  Cognitive Features That Contribute To Risk:  None    Suicide Risk:  Minimal: No identifiable suicidal ideation.  Patients presenting with no risk factors but with morbid ruminations; may be classified as minimal risk based on the severity of  the depressive symptoms  Follow-up Information    Follow up with [facility] On 05/10/2015.   Why:  Appt on this date at 8:00AM with Dr. Tenny Craw for medication management.    Contact information:   [address] [place], [place] [address] Phone: [phone] Fax: [phone]      Follow up with [facility] On 04/26/2015.   Why:  Appt on this date at 10:00AM with Peggy for counseling. PLEASE ARRIVE AT 9:00AM TO COMPLETE NEW PATIENT PAPERWORK. Please bring photo ID and medicare card to this appt.    Contact information:   [address] [place], [place] [address] Phone: [phone] Fax: [phone]      Plan Of Care/Follow-up recommendations:  Activity:  as tolerated Diet:  regular, heart healthy  will continue the Ativan TID while waiting for the Zoloft to start working. The Ativan could be wean off over the course of several weeks. Rachael Fee, MD 04/04/2015, 11:57 AM

## 2015-04-04 NOTE — Progress Notes (Signed)
Patient blood pressure elevated this AM Donell SievertSpencer Simon notified.  Patient to receive AM meds at 0800 and then recheck b/p later.

## 2015-04-04 NOTE — Progress Notes (Signed)
Recreation Therapy Notes  Date: 03.15.2017 Time: 9:30am Location: [address] Group Room   Group Topic: Stress Management  Goal Area(s) Addresses:  Patient will actively participate in stress management techniques presented during session.   Behavioral Response: Did not attend.   Marykay Lexenise L Zanyia Silbaugh, LRT/CTRS        Donyel Castagnola L 04/04/2015 1:35 PM

## 2015-04-04 NOTE — Discharge Summary (Signed)
Physician Discharge Summary Note  Patient:  Jerah Esty is an 70 y.o., female MRN:  [id number] DOB:  Jul 03, 1945 Patient phone:  [phone] (home)  Patient address:   [address] [place] [address],  Total Time spent with patient: Greater than 30 minutes  Date of Admission:  03/30/2015 Date of Discharge: 04-04-15  Reason for Admission: Worsening symptoms of depression/anxiety triggering suicidal gestures.  Principal Problem: MDD (major depressive disorder), recurrent episode, severe [facility])  Discharge Diagnoses: Patient Active Problem List   Diagnosis Date Noted  . Uncomplicated opioid dependence (HCC) [F11.20]   . Severe episode of recurrent major depressive disorder, without psychotic features (HCC) [F33.2]   . 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 MDD (major depressive disorder), recurrent episode, severe (HCC) [F33.2] 03/30/2015  . Opiate addiction (HCC) [F11.20] 03/30/2015  . GAD (generalized anxiety disorder) [F41.1] 03/30/2015  . Abnormal findings on cardiac catheterization [R93.1]   . 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 NSTEMI (non-ST elevated myocardial infarction) (HCC) [I21.4] 01/09/2015  . Hypertension [I10] 01/08/2015  . 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 Atrial fibrillation (HCC) [I48.91] 01/08/2015  . Subtherapeutic international normalized ratio (INR) [R79.1]   . Rectal bleeding [K62.5] 05/13/2014  . Upper GI bleed [K92.2] 05/13/2014  . Tobacco use [Z72.0] 05/13/2014  . Elevated troponin [R79.89] 05/13/2014  . Leukocytosis [D72.829] 05/13/2014  . Hyponatremia [E87.1] 11/11/2013  . Hypothyroidism [E03.9] 11/11/2013  . Chronic back pain [M54.9, G89.29] 11/11/2013  . COPD (chronic obstructive pulmonary disease) (HCC) [J44.9] 11/11/2013  . Aneurysm, cerebral [I67.1] 11/11/2013   Past Psychiatric History: Generalized  anxiety  Past Medical History:  Past Medical History  Diagnosis Date  . Brain aneurysm 10/20/13    3 on lt side and 1 on the right, most recent Oct 1  . Thyroid disease   . Hypertension   . COPD (chronic obstructive pulmonary disease) (HCC)   . Asthma   . Chronic back pain   . Sciatica   . Hyponatremia   . Stroke [facility])     seen on CT scan in 2015  . Episodic lightheadedness     since cerebral aneurysm repair 10/2013  . Atrial fibrillation (HCC)   . S/P mitral valve replacement with metallic valve 01/08/2015  . S/P aortic valve replacement with metallic valve 01/09/2015    [facility] 1995  . 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 Left coronary system traverses lateral to pulmonary artery.     Past Surgical History  Procedure Laterality Date  . Brain surgery      aneurysm stent placed 2015  . Cardiac surgery    . Cholecystectomy    . Appendectomy    . Cataract extraction    . Cardiac catheterization N/A 01/09/2015    Procedure: Left Heart Cath and Coronary Angiography;  Surgeon: Lennette Bihari, MD;  Location[facility] INVASIVE CV LAB;  Service: Cardiovascular;  Laterality: N/A;   Family History:  Family History  Problem Relation Age of Onset  . Cancer Sister   . Cancer Brother   . Kidney failure Father    Family Psychiatric  History: See H&P  Social History:  History  Alcohol Use No     History  Drug Use No    Social History   Social History  . Marital Status: Married    Spouse Name:  N/A  . Number of Children: N/A  . Years of Education: N/A   Social History Main Topics  . Smoking status: Former Smoker -- 1.00 packs/day for 40 years    Types: Cigarettes    Quit date: 01/07/2015  . Smokeless tobacco: Never Used  . Alcohol Use: No  . Drug Use: No  . Sexual Activity: Not Asked   Other Topics Concern  . None   Social History Narrative   Hospital Course: Angelique Chevalier is a 70 year old Caucasian female  who presented to [facility] as a walk in with her husband and son. Patient was found by family this morning with a loaded gun verbalizing a plan to shoot herself. Her son was very concerned that his mother recently admitted to abusing lortab and has been having episodes of confusion. Her family shared that they were concerned patient has been experiencing depressive symptoms for a number of years but has only been prescribed xanax by her cardiologist. Caytlyn is noted to be very anxious during her psychiatric assessment today. She admits to numerous symptoms of depression such as insomnia, anxiety, depressed mood, decreased appetite, decreased motivation, low energy, and suicidal thoughts. Patient denies any past attempts only having thoughts. Patient denies every having seen a Psychiatrist or being diagnosed with a mood disorder. The patient reports having several medical problems including chronic pain in her back from degenerative disc disease.  Shalaine was admitted to the [facility] adult unit for Suicidal ideations/gestures related to worsening symptoms of depression & generalized anxiety disorder. She was also abusing other drugs (opioid), besides the chronic use of her prescribed 1 mg Xanax tablets. The family were concerned over her presentation of episodes of confusion, depressed mood & prolonged use of Xanax. As a result was brought to the hospital for evaluation & treatment. During her admission assessment, Kachina was evaluated & her symptoms identified. The medication management was discussed and initiated targeting those presenting symptoms of depression/anxiety issues. She was oriented to the unit and encouraged to participate in the unit programming. Her other presenting medical problems were identified & treated appropriately by resuming all her pertinent home medications for those health issues. She tolerated her treatment regimen without any significant adverse effects and or reactions.  During the course of Siyana's   hospital stay, she was evaluated daily by a clinical provider to ascertain her response to her treatment regimen. As the day goes by, some improvement was noted by the patient's reports of some decreasing symptoms, medication tolerance & participation in the unit programming.  She was required on daily basis to complete a self inventory asssessment noting mood, mental status, any new symptoms or concerns. Her symptoms responded well to some extent to her treatment regimen. Although, Aliah did present appropriate behavior & was motivated for recovery, she was adamant about continuing the use of her Xanax pills as she stated that she is yet, not ready to deal with life without this medicine. She appeared fearful, worried & unsure as to whether she will be able to cope better after discharge.  On this day of her discharge, Knox is in much improved condition than upon admission. However, she remained with high anxiety levels which she attributed to her being discharged from the hospital today. Her symptoms were reported as significantly decreased but not resolved completely, at least she presented as such during the review of her discharge instructions. However, she was mentally, emotionally & physically stable to be discharged to her home with family. Upon discharge,  she adamantly denies any SIHI & voiced no AVH. She was motivated to continue taking medication with a goal of continued improvement in mental health. Corrie DandyMary was discharged home with a plan to follow up as noted below. She was medicated & discharged on; Alprazolam 1 mg for severe anxiety, Sertraline 50 mg for depression & Trazodone 50 mg for insomnia. [facility] in no apparent distress. Transportation per husband.  Consults:  psychiatry  Significant Diagnostic Studies:  labs: CBC with diff, CMP, UDS, toxicology tests, U/A, results reviewed, stable   Physical Findings: AIMS: Facial and Oral Movements Muscles of Facial  Expression: None, normal Lips and Perioral Area: None, normal Jaw: None, normal Tongue: None, normal,Extremity Movements Upper (arms, wrists, hands, fingers): None, normal Lower (legs, knees, ankles, toes): None, normal, Trunk Movements Neck, shoulders, hips: None, normal, Overall Severity Severity of abnormal movements (highest score from questions above): None, normal Incapacitation due to abnormal movements: None, normal Patient's awareness of abnormal movements (rate only patient's report): No Awareness, Dental Status Current problems with teeth and/or dentures?: No Does patient usually wear dentures?: No  CIWA:  CIWA-Ar Total: 4 COWS:  COWS Total Score: 1  Musculoskeletal: Strength & Muscle Tone: within normal limits Gait & Station: normal Patient leans: N/A  Psychiatric Specialty Exam: Review of Systems  Constitutional: Negative.   HENT: Negative.   Eyes: Negative.   Respiratory: Negative.   Cardiovascular: Negative.        Hx. Cardiac issues  Gastrointestinal: Negative.   Genitourinary: Negative.   Musculoskeletal: Negative.   Skin: Negative.   Neurological: Negative.   Endo/Heme/Allergies: Negative.   Psychiatric/Behavioral: Positive for depression (Stable) and substance abuse (Hx. tobacco & benzodiazepine abuse). Negative for suicidal ideas, hallucinations and memory loss. The patient has insomnia (Stable). The patient is not nervous/anxious.     Blood pressure 183/91, pulse 75, temperature 97.7 F (36.5 C), temperature source Oral, resp. rate 20, height 5\' 7"  (1.702 m), weight 59.648 kg (131 lb 8 oz), SpO2 100 %.Body mass index is 20.59 kg/(m^2).  See Md's SRA   Have you used any form of tobacco in the last 30 days? (Cigarettes, Smokeless Tobacco, Cigars, and/or Pipes): Yes  Has this patient used any form of tobacco in the last 30 days? (Cigarettes, Smokeless Tobacco, Cigars, and/or Pipes): Yes, provided with Nicotine patch for smoking cessation.  Blood Alcohol  level:  Lab Results  Component Value Date   ETH <5 03/30/2015   ETH <5 01/07/2015    Metabolic Disorder Labs:  Lab Results  Component Value Date   HGBA1C 5.6 03/31/2015   MPG 114 03/31/2015   No results found for: PROLACTIN No results found for: CHOL, TRIG, HDL, CHOLHDL, VLDL, LDLCALC  See Psychiatric Specialty Exam and Suicide Risk Assessment completed by Attending Physician prior to discharge.  Discharge destination:  Home  Is patient on multiple antipsychotic therapies at discharge:  No   Has Patient had three or more failed trials of antipsychotic monotherapy by history:  No  Recommended Plan for Multiple Antipsychotic Therapies: NA     Discharge Instructions    Discharge instructions    Complete by:  As directed   Please continue Coumadin dose of 7.5 mg daily & Follow-up with your primary care provider on Monday 04-09-15 for PT/INR blood work            Medication List    STOP taking these medications        docusate sodium 100 MG capsule  Commonly known as:  COLACE     furosemide 20 MG tablet  Commonly known as:  LASIX     HYDROcodone-acetaminophen 10-325 MG tablet  Commonly known as:  NORCO     ipratropium-albuterol 0.5-2.5 (3) MG/3ML Soln  Commonly known as:  DUONEB     montelukast 10 MG tablet  Commonly known as:  SINGULAIR      TAKE these medications      Indication   albuterol (2.5 MG/3ML) 0.083% nebulizer solution  Commonly known as:  PROVENTIL  Take 3 mLs (2.5 mg total) by nebulization every 6 (six) hours as needed for wheezing or shortness of breath.   Indication:  Acute Bronchospasm, Respiratory Distress Syndrome     ALPRAZolam 1 MG tablet  Commonly known as:  XANAX  Take 1 tablet (1 mg) twice daily: For severe anxiety   Indication:  Feeling Anxious, Panic Disorder     COUMADIN 5 MG tablet  Generic drug:  warfarin  (Follow-up with yr MD on Monday for blood work), Take 7.5 mg daily: For blood blood thinner.   Indication:  Blood  Vessel Obstruction by a Blood Clot     digoxin 0.25 MG tablet  Commonly known as:  LANOXIN  Take 1 tablet (0.25 mg total) by mouth every morning. For heart problems   Indication:  Congestive Heart Failure     fluticasone 50 MCG/ACT nasal spray  Commonly known as:  FLONASE  Place 2 sprays into both nostrils every evening. For allergies   Indication:  Allergic Rhinitis     levothyroxine 50 MCG tablet  Commonly known as:  SYNTHROID, LEVOTHROID  Take 1 tablet (50 mcg total) by mouth at bedtime. For low functioning thyroid gland   Indication:  Underactive Thyroid     losartan 50 MG tablet  Commonly known as:  COZAAR  Take 1 tablet (50 mg total) by mouth every evening. For high blood pressure   Indication:  High Blood Pressure     metoprolol tartrate 25 MG tablet  Commonly known as:  LOPRESSOR  Take 0.5 tablets (12.5 mg total) by mouth 2 (two) times daily. For high blood pressure   Indication:  High Blood Pressure     nicotine 7 mg/24hr patch  Commonly known as:  NICODERM CQ  Place 1 patch (7 mg total) onto the skin daily. Start when 14 mg patches have finished: For nicotine addiction   Indication:  Nicotine Addiction     pantoprazole 20 MG tablet  Commonly known as:  PROTONIX  Take 1 tablet (40 mg) daily: For acid reflux   Indication:  Gastroesophageal Reflux Disease     sertraline 50 MG tablet  Commonly known as:  ZOLOFT  Take 1 tablet (50 mg total) by mouth daily. For depression   Indication:  Major Depressive Disorder     traZODone 50 MG tablet  Commonly known as:  DESYREL  Take 1 tablet (50 mg total) by mouth at bedtime and may repeat dose one time if needed. For sleep   Indication:  Trouble Sleeping     umeclidinium bromide 62.5 MCG/INH Aepb  Commonly known as:  INCRUSE ELLIPTA  Inhale 1 puff into the lungs at bedtime. For COPD   Indication:  Chronic Obstructive Lung Disease       Follow-up Information    Follow up with [facility] On 05/10/2015.   Why:  Appt on this date at 8:00AM with Dr. Tenny Craw for medication management.    Contact information:  [address] [place], [place] [address] Phone: [phone] Fax: [phone]      Follow up with [facility] On 04/26/2015.   Why:  Appt on this date at 10:00AM with Peggy for counseling. PLEASE ARRIVE AT 9:00AM TO COMPLETE NEW PATIENT PAPERWORK. Please bring photo ID and medicare card to this appt.    Contact information:   [address] [place], [place] [address] Phone: [phone] Fax: [phone]     Follow-up recommendations: Activity:  As tolerated Diet: As recommended by your primary care doctor. Keep all scheduled follow-up appointments as recommended.   Comments: Take all your medications as prescribed by your mental healthcare provider. Report any adverse effects and or reactions from your medicines to your outpatient provider promptly. Patient is instructed and cautioned to not engage in alcohol and or illegal drug use while on prescription medicines. In the event of worsening symptoms, patient is instructed to call the crisis hotline, 911 and or go to the nearest ED for appropriate evaluation and treatment of symptoms. Follow-up with your primary care provider for your other medical issues, concerns and or health care needs.   Signed: Sanjuana Kava, NP, PMHNP, FNP-BC 04/05/2015, 2:04 PM  I personally assessed the patient and formulated the plan Madie Reno A. Dub Mikes, M.D.

## 2015-04-04 NOTE — Progress Notes (Signed)
  [facility] :  Will you be returning to the same living situation after discharge:  Yes,  home with husband At discharge, do you have transportation home?: Yes,  husband coming at 2pm  Do you have the ability to pay for your medications: Yes,  Medicare  Release of information consent forms completed and submitted to medical records by CSW.  Patient to Follow up at: Follow-up Information    Follow up with [facility] On 05/10/2015.   Why:  Appt on this date at 8:00AM with Dr. Tenny Crawoss for medication management.    Contact information:   [address] [place], [place] [address] Phone: [phone] Fax: [phone]      Follow up with [facility] On 04/26/2015.   Why:  Appt on this date at 10:00AM with Peggy for counseling. PLEASE ARRIVE AT 9:00AM TO COMPLETE NEW PATIENT PAPERWORK. Please bring photo ID and medicare card to this appt.    Contact information:   [address] [place], [place] [address] Phone: [phone] Fax: [phone]      Next level of care provider [facility]:no  Safety Planning and Suicide Prevention discussed: Yes,  SPE completed with pt's husband. SPI pamphlet and mobile crisis information provided to pt.   Have you used any form of tobacco in the last 30 days? (Cigarettes, Smokeless Tobacco, Cigars, and/or Pipes): Yes  Has patient been referred to the Quitline?: Patient refused referral  Patient has been referred for addiction treatment: Yes  Smart, Yoana Staib LCSW 04/04/2015, 10:10 AM
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
## 2015-04-04 NOTE — Progress Notes (Signed)
Pt discharged home with bus pass. Pt was ambulatory, stable and appreciative at that time. All papers and prescriptions were given and valuables returned. Verbal understanding expressed. Denies SI/HI and A/VH. Pt given opportunity to express concerns and ask questions. 

## 2015-04-24 ENCOUNTER — Telehealth (HOSPITAL_COMMUNITY): Payer: Self-pay | Admitting: *Deleted

## 2015-04-24 NOTE — Telephone Encounter (Signed)
Called pt to resch appt for April 20th due to provider being out of office. Pt phone kept ringing and no voicemail.

## 2015-04-26 ENCOUNTER — Encounter (HOSPITAL_COMMUNITY): Payer: Self-pay | Admitting: Psychiatry

## 2015-04-26 ENCOUNTER — Ambulatory Visit (INDEPENDENT_AMBULATORY_CARE_PROVIDER_SITE_OTHER): Payer: Medicare Other | Admitting: Psychiatry

## 2015-04-26 DIAGNOSIS — F332 Major depressive disorder, recurrent severe without psychotic features: Secondary | ICD-10-CM
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
## 2015-04-26 NOTE — Patient Instructions (Signed)
Discussed orally 

## 2015-04-27 ENCOUNTER — Encounter (HOSPITAL_COMMUNITY): Payer: Self-pay | Admitting: Psychiatry

## 2015-04-27 ENCOUNTER — Ambulatory Visit (INDEPENDENT_AMBULATORY_CARE_PROVIDER_SITE_OTHER): Payer: Medicare Other | Admitting: Psychiatry

## 2015-04-27 VITALS — BP 138/57 | HR 60 | Ht 67.0 in | Wt 129.4 lb

## 2015-04-27 DIAGNOSIS — F322 Major depressive disorder, single episode, severe without psychotic features: Secondary | ICD-10-CM

## 2015-04-27 DIAGNOSIS — F329 Major depressive disorder, single episode, unspecified: Secondary | ICD-10-CM | POA: Insufficient documentation

## 2015-04-27 MED ORDER — ALPRAZOLAM 1 MG PO TABS: 1.0000 mg | ORAL_TABLET | Freq: Three times a day (TID) | ORAL | Status: DC | PRN

## 2015-04-27 MED ORDER — TRAZODONE HCL 50 MG PO TABS: 50.0000 mg | ORAL_TABLET | Freq: Every evening | ORAL | Status: DC | PRN

## 2015-04-27 MED ORDER — DULOXETINE HCL 60 MG PO CPEP: 60.0000 mg | ORAL_CAPSULE | Freq: Every day | ORAL | Status: DC

## 2015-04-27 NOTE — Progress Notes (Signed)
Psychiatric Initial Adult Assessment   Patient Identification: Jasmine West MRN:  [id number] Date of Evaluation:  04/27/2015 Referral Source: Behavioral health hospital Chief Complaint:   Chief Complaint    Depression; Anxiety; Drug Problem; Establish Care     Visit Diagnosis:    ICD-9-CM ICD-10-CM   1. Severe single current episode of major depressive disorder, without psychotic features (HCC) 296.23 F32.2     History of Present Illness:  This patient is a 70 year old married white female who lives [place] her [place] in [place]. She is a retired Interior and spatial designer.  The patient was referred by the behavioral health hospital where she was inpatient from March 10 through 04/04/2015. The patient was admitted after she was found with a gun by her husband and son threatening to kill herself. 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 She started using it several years ago for back pain. She stated that her doctor "wasn't giving me enough" so she started getting it from friends and off the street. She was using 4-5 tablets a day. She also has a history of anxiety ever since she had valve replacement surgery in 1995 and has been on Xanax chronically. However she states that she does not abuse Xanax.  On February 10 the patient stated that she had had enough of buying the Lortab illegally and stopped it cold [place]. She went through withdrawal symptoms such as nausea anxiety depression unable to function or think agitation and anxiety. She felt so desperate that she became suicidal. While at the hospital she was started on Zoloft which she has since discontinued because she worried it would interact with the digoxin that she is on. She's continued Xanax from her cardiologist and trazodone from the hospital which has helped her sleep.  Since coming back from the hospital 3 weeks ago which she states that she's not a whole lot better. She still feels depressed and  anxious. She can't concentrate or function very well and is not doing her normal task such as housework or paying bills. She is sleeping better with the trazodone. She states that she withdrawn from people as not going to church or interacting with friends. She feels very helpless and hopeless. However she states that she would never kill herself or do this to her family. She is close to her niece and sister and other friends. She is willing to try another antidepressant and I suggested Cymbalta because it would help with both chronic pain and depression. She is on no pain medicine now and states that her back pain comes and goes. She mostly goes to specialist such as cardiologist and doesn't have a primary doctor and I urged her to get one to begin the process of pain management  Associated Signs/Symptoms: Depression Symptoms:  depressed mood, anhedonia, insomnia, psychomotor agitation, psychomotor retardation, fatigue, feelings of worthlessness/guilt, difficulty concentrating, hopelessness, suicidal thoughts without plan, anxiety, panic attacks, loss of energy/fatigue, disturbed sleep, weight loss, (Hypo) Manic Symptoms:  Irritable Mood, Anxiety Symptoms:  Excessive Worry, Panic Symptoms, Social Anxiety,   Past Psychiatric History: The patient had no prior psychiatric history before the admission last month  Previous Psychotropic Medications: Yes   Substance Abuse History in the last 12 months:  Yes.    Consequences of Substance Abuse: Medical Consequences:  Withdrawal from narcotics cause severe depression and agitation and suicidal ideation. She ended up in the hospital at behavioral health Withdrawal Symptoms:   Nausea  Past Medical History:  Past Medical History  Diagnosis Date  . Brain aneurysm 10/20/13    3 on lt side and 1 on the right, most recent Oct 1  . Thyroid disease   . Hypertension   . COPD (chronic obstructive pulmonary disease) (HCC)   . Asthma   . Chronic  back pain   . Sciatica   . Hyponatremia   . Stroke [facility](HCC)     seen on CT scan in 2015  . Episodic lightheadedness     since cerebral aneurysm repair 10/2013  . Atrial fibrillation (HCC)   . S/P mitral valve replacement with metallic valve 01/08/2015  . S/P aortic valve replacement with metallic valve 01/09/2015    [facility] 1995  . 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 Left coronary system traverses lateral to pulmonary artery.   . Depression     Past Surgical History  Procedure Laterality Date  . Brain surgery      aneurysm stent placed 2015  . Cardiac surgery    . Cholecystectomy    . Appendectomy    . Cataract extraction    . Cardiac catheterization N/A 01/09/2015    Procedure: Left Heart Cath and Coronary Angiography;  Surgeon: Lennette Biharihomas A Kelly, MD;  [facility];  Service: Cardiovascular;  Laterality: N/A;    Family Psychiatric History: none  Family History:  Family History  Problem Relation Age of Onset  . Cancer Sister   . Cancer Brother   . Kidney failure Father     Social History:   Social History   Social History  . Marital Status: Married    Spouse Name: N/A  . Number of Children: N/A  . Years of Education: N/A   Social History Main Topics  . Smoking status: Former Smoker -- 1.00 packs/day for 40 years    Types: Cigarettes    Quit date: 01/07/2015  . Smokeless tobacco: Never Used     Comment: 04-27-15 per pt she use E-Vap. and stopped Cigarettes on 01-13-15.  Marland Kitchen. Alcohol Use: No     Comment: History of using hydrocodone 4 piils 10mg  eacjh  per day, 04-27-15 per pt no  . Drug Use: Yes    Special: Hydrocodone     Comment: 04-27-2015 per pt not now and stopped Hydrocodone Feb 2017  . Sexual Activity: Yes    Birth Control/ Protection: Post-menopausal   Other Topics Concern  . None   Social History Narrative    Additional Social History: [place]  with 2 sisters and 1 brother. She states that she had a very good childhood. She did not finish high school but later got a GED. She has one grown son but no grandchildren. 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 She had to stop working due to many medical problems such as having heart valve replacements as well as forebrain stents put in. She states that her husband is very supportive but sometimes gets irritable with her  Allergies:   Allergies  Allergen Reactions  . Sulfa Antibiotics Photosensitivity    Yellow eyes    Metabolic Disorder Labs: Lab Results  Component Value Date   HGBA1C 5.6 03/31/2015   MPG 114 03/31/2015   No results found for: PROLACTIN No results found for: CHOL, TRIG, HDL, CHOLHDL, VLDL, LDLCALC   Current Medications: Current Outpatient Prescriptions  Medication Sig Dispense Refill  .  albuterol (PROVENTIL) (2.5 MG/3ML) 0.083% nebulizer solution Take 3 mLs (2.5 mg total) by nebulization every 6 (six) hours as needed for wheezing or shortness of breath. 75 mL 12  . Albuterol Sulfate (VENTOLIN HFA IN) Inhale into the lungs as needed.    . ALPRAZolam (XANAX) 1 MG tablet Take 1 tablet (1 mg total) by mouth 3 (three) times daily as needed for anxiety. 90 tablet 2  . COUMADIN 5 MG tablet (Follow-up with yr MD on Monday for blood work), Take 7.5 mg daily: For blood blood thinner. (Patient taking differently: (Follow-up with yr MD on Monday, Wednesday, Friday for blood work), Take 7.5 mg on other days: For blood blood thinner.) 10 tablet 0  . digoxin (LANOXIN) 0.25 MG tablet Take 1 tablet (0.25 mg total) by mouth every morning. For heart problems    . fluticasone (FLONASE) 50 MCG/ACT nasal spray Place 2 sprays into both nostrils every evening. For allergies  2  . levothyroxine (SYNTHROID, LEVOTHROID) 50 MCG tablet Take 1 tablet (50 mcg total) by mouth at bedtime. For low functioning thyroid gland    . losartan (COZAAR) 50 MG tablet Take 1  tablet (50 mg total) by mouth every evening. For high blood pressure    . metoprolol tartrate (LOPRESSOR) 25 MG tablet Take 0.5 tablets (12.5 mg total) by mouth 2 (two) times daily. For high blood pressure  9  . nicotine (NICODERM CQ) 7 mg/24hr patch Place 1 patch (7 mg total) onto the skin daily. Start when 14 mg patches have finished: For nicotine addiction (Patient taking differently: Place 7 mg onto the skin daily as needed. Start when 14 mg patches have finished: For nicotine addiction) 28 patch 0  . pantoprazole (PROTONIX) 20 MG tablet Take 1 tablet (40 mg) daily: For acid reflux 30 tablet 0  . traZODone (DESYREL) 50 MG tablet Take 1 tablet (50 mg total) by mouth at bedtime and may repeat dose one time if needed. For sleep 60 tablet 2  . umeclidinium bromide (INCRUSE ELLIPTA) 62.5 MCG/INH AEPB Inhale 1 puff into the lungs at bedtime. For COPD    . DULoxetine (CYMBALTA) 60 MG capsule Take 1 capsule (60 mg total) by mouth daily. 30 capsule 2  . sertraline (ZOLOFT) 50 MG tablet Take 1 tablet (50 mg total) by mouth daily. For depression (Patient not taking: Reported on 04/26/2015) 45 tablet 0   No current facility-administered medications for this visit.    Neurologic: Headache: No Seizure: No Paresthesias:No  Musculoskeletal: Strength & Muscle Tone: within normal limits Gait & Station: normal Patient leans: N/A  Psychiatric Specialty Exam: Review of Systems  Constitutional: Positive for weight loss and malaise/fatigue.  Musculoskeletal: Positive for back pain.  Psychiatric/Behavioral: Positive for depression, suicidal ideas and substance abuse. The patient is nervous/anxious and has insomnia.     Blood pressure 138/57, pulse 60, height  (1.702 m), weight 129 lb 6.4 oz (58.695 kg), SpO2 97 %.Body mass index is 20.26 kg/(m^2).  General Appearance: Casual, Neat and Well Groomed  Eye Contact:  Good  Speech:  Clear and Coherent  Volume:  Normal  Mood:  Anxious, Depressed and  Hopeless  Affect:  Constricted, Depressed and Tearful  Thought Process:  Goal Directed  Orientation:  Full (Time, Place, and Person)  Thought Content:  Rumination  Suicidal Thoughts:  Yes.  without intent/plan  Homicidal Thoughts:  No  Memory:  Immediate;   Good Recent;   Fair Remote;   Fair  Judgement:  Poor  Insight:  Lacking  Psychomotor Activity:  Decreased  Concentration:  Poor  Recall:  Fair  Fund of Knowledge:Good  Language: Good  Akathisia:  No  Handed:  Right  AIMS (if indicated):    Assets:  Communication Skills Desire for Improvement Resilience Social Support  ADL's:  Intact  Cognition: WNL  Sleep:  Okay with medication     Treatment Plan Summary: Medication management   This patient is a 70 year old white female with a history of narcotic addiction. She recently took herself off Lortab and became exceedingly anxious and depressed with suicidal ideation and plan. She did not follow through on taking her antidepressant because she is worried about side effects. I reassured her that the antidepressant medicines are safe to take with her cardiac medicines. Since she is suffering from chronic pain and depression I suggested we start Cymbalta and she agrees. She can continue trazodone for sleep and Xanax for anxiety which is prescribed by her cardiologist. She will continue her counseling with Florencia Reasons here and return to see me in 4 weeks   Diannia Ruder, MD 4/7/20179:55 AM

## 2015-04-30 ENCOUNTER — Other Ambulatory Visit (HOSPITAL_COMMUNITY): Payer: Self-pay | Admitting: Psychiatry

## 2015-04-30 ENCOUNTER — Telehealth (HOSPITAL_COMMUNITY): Payer: Self-pay | Admitting: *Deleted

## 2015-04-30 MED ORDER — DULOXETINE HCL 30 MG PO CPEP: 30.0000 mg | ORAL_CAPSULE | Freq: Every day | ORAL | Status: DC

## 2015-04-30 NOTE — Telephone Encounter (Signed)
Pt called stating she took the Cymbalta for 4 days now and she can not function. Per pt, it's just not working and will not take medication. Per pt, she don't know what to do and would just like to just cancel all of her appts with Dr. Tenny Crawoss. Asked pt if she would just like to come back in just one more time to talk about her medications and she rejected. Per pt, she would like to cancel both Peggy and Dr. Tenny Crawoss appt due to just wanted to and if she need her sleeping pills she will get in touch with office or something. Pt number is [phone].

## 2015-04-30 NOTE — Telephone Encounter (Signed)
Spoke to pt. Will cut down cymbalta to 30 mg. Do not cancel appts

## 2015-04-30 NOTE — Telephone Encounter (Signed)
Spoke with pt and informed her of her f/u visits with both Peggy and Dr. Tenny Crawoss. Per pt, she do not want to come to appt but she will only come one last time and dates and times was provided to pt and she showed understanding

## 2015-05-08 ENCOUNTER — Ambulatory Visit (INDEPENDENT_AMBULATORY_CARE_PROVIDER_SITE_OTHER): Payer: Medicare Other | Admitting: Psychiatry

## 2015-05-08 ENCOUNTER — Encounter (HOSPITAL_COMMUNITY): Payer: Self-pay | Admitting: Psychiatry

## 2015-05-08 DIAGNOSIS — F322 Major depressive disorder, single episode, severe without psychotic features: Secondary | ICD-10-CM | POA: Diagnosis not present

## 2015-05-08 NOTE — Patient Instructions (Signed)
Discussed orally 

## 2015-05-08 NOTE — Progress Notes (Signed)
   THERAPIST PROGRESS NOTE  Session Time: Tuesday 4/ 18/2017 10:15 AM - 11:10 AM  Participation Level: Active  Behavioral Response: Well GroomedAlertDepressed  Type of Therapy: Individual Therapy  Treatment Goals addressed: Establish rapport, implement coping skills to overcome depression  Interventions: Supportive  Summary: Jasmine PattersonMary West is a 70 y.o. female who presents with a history of symptoms depression and anxiety for several years. She began using hydrocodone illegallly 3 years ago but quit cold [place] in February 2017. Symptoms of depression worsened due to withdrawal and pain. She contemplated suicide because she was so miserable and [facility]. She states now  feeling like she let everybody down and has destroyed her life.. She reports stress related to marriage as she says husband is a fusser.  Patient reports no change in symptoms since initial session and continues to experience fatigue, loss of interest in activities, depressed mood, and poor motivation. She expresses frustration and anger she does not feel like doing anything. However, patient has tried to participate in some activities such as walking and making her bed. She has feelings of hopelessness and helplessness but says she would not do anything to harm self. She expresses frustration with husband and others as she says they expect her just to get over depression. Patient admits having unrealistic expectations of self. She questions whether or not she actually has depression and whether or not the medication is actually helping.   Suicidal/Homicidal: No  Therapist Response: Established rapport, reviewed symptoms, facilitated expression of feelings, provided psychoeducation regarding depression, discussed importance of medication compliance, assisted patient identify ways to improve daily structure/routine and increase involvement in activity, discussed ways to improve self-care,  assisted patient identify more realistic expectations of self  Plan: Return again in 2 weeks. Patient agrees to use daily planning and bring forms to next session.  Diagnosis: Axis I: Major Depression, Recurrent severe    Axis II: Deferred    Shiquan Mathieu, LCSW 05/08/2015

## 2015-05-10 ENCOUNTER — Ambulatory Visit (HOSPITAL_COMMUNITY): Payer: Self-pay | Admitting: Psychiatry

## 2015-05-21 ENCOUNTER — Encounter (HOSPITAL_COMMUNITY): Payer: Self-pay | Admitting: Psychiatry

## 2015-05-21 ENCOUNTER — Ambulatory Visit (INDEPENDENT_AMBULATORY_CARE_PROVIDER_SITE_OTHER): Payer: Medicare Other | Admitting: Psychiatry

## 2015-05-21 VITALS — BP 167/98 | HR 63 | Ht 67.0 in | Wt 130.0 lb

## 2015-05-21 DIAGNOSIS — F322 Major depressive disorder, single episode, severe without psychotic features: Secondary | ICD-10-CM | POA: Diagnosis not present

## 2015-05-21 MED ORDER — DULOXETINE HCL 30 MG PO CPEP: 30.0000 mg | ORAL_CAPSULE | Freq: Two times a day (BID) | ORAL | Status: DC

## 2015-05-21 MED ORDER — TRAZODONE HCL 50 MG PO TABS: 50.0000 mg | ORAL_TABLET | Freq: Every evening | ORAL | Status: DC | PRN

## 2015-05-21 NOTE — Progress Notes (Signed)
Patient ID: Jasmine West, female   DOB: [birth year], 70 y.o.   MRN: [id number]  Psychiatric Initial Adult Assessment   Patient Identification: Jasmine West MRN:  [id number] Date of Evaluation:  05/21/2015 Referral Source: Behavioral health hospital Chief Complaint:   Chief Complaint    Depression; Anxiety; Follow-up     Visit Diagnosis:    ICD-9-CM ICD-10-CM   1. Severe single current episode of major depressive disorder, without psychotic features (HCC) 296.23 F32.2     History of Present Illness:  This patient is a 70 year old married white female who lives with her husband in [place]. She is a retired Interior and spatial designer.  The patient was referred by the behavioral health hospital where she was inpatient from March 10 through 04/04/2015. The patient was admitted after she was found with a gun by her husband and son threatening to kill herself. 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 She started using it several years ago for back pain. She stated that her doctor "wasn't giving me enough" so she started getting it from friends and off the street. She was using 4-5 tablets a day. She also has a history of anxiety ever since she had valve replacement surgery in 1995 and has been on Xanax chronically. However she states that she does not abuse Xanax.  On February 10 the patient stated that she had had enough of buying the Lortab illegally and stopped it cold [place]. She went through withdrawal symptoms such as nausea anxiety depression unable to function or think agitation and anxiety. She felt so desperate that she became suicidal. While at the hospital she was started on Zoloft which she has since discontinued because she worried it would interact with the digoxin that she is on. She's continued Xanax from her cardiologist and trazodone from the hospital which has helped her sleep.  Since coming back from the hospital 3 weeks ago which she states that she's  not a whole lot better. She still feels depressed and anxious. She can't concentrate or function very well and is not doing her normal task such as housework or paying bills. She is sleeping better with the trazodone. She states that she withdrawn from people as not going to church or interacting with friends. She feels very helpless and hopeless. However she states that she would never kill herself or do this to her family. She is close to her niece and sister and other friends. She is willing to try another antidepressant and I suggested Cymbalta because it would help with both chronic pain and depression. She is on no pain medicine now and states that her back pain comes and goes. She mostly goes to specialist such as cardiologist and doesn't have a primary doctor and I urged her to get one to begin the process of pain management  The patient returns after 4 weeks with her husband. She called in the interim and stated the 60 mg Cymbalta was too much and was making her very tired. We cut it back to 30 mg and she seems to be tolerating it. Her husband states that she is less depressed but she still very anxious. I suggested we try to get the Cymbalta up to 30 mg twice a day and she reluctantly agrees. She doesn't want to change the Xanax to anything else right now the trazodone is helping her sleep. Finally she called a primary doctor just today to try to get in because she doesn't really  need help with pain management. She is not eating well but her weight is stable  Associated Signs/Symptoms: Depression Symptoms:  depressed mood, anhedonia, insomnia, psychomotor agitation, psychomotor retardation, fatigue, feelings of worthlessness/guilt, difficulty concentrating, hopelessness, suicidal thoughts without plan, anxiety, panic attacks, loss of energy/fatigue, disturbed sleep, weight loss, (Hypo) Manic Symptoms:  Irritable Mood, Anxiety Symptoms:  Excessive Worry, Panic Symptoms, Social  Anxiety,   Past Psychiatric History: The patient had no prior psychiatric history before the admission last month  Previous Psychotropic Medications: Yes   Substance Abuse History in the last 12 months:  Yes.    Consequences of Substance Abuse: Medical Consequences:  Withdrawal from narcotics cause severe depression and agitation and suicidal ideation. She ended up in the hospital at behavioral health Withdrawal Symptoms:   Nausea  Past Medical History:  Past Medical History  Diagnosis Date  . Brain aneurysm 10/20/13    3 on lt side and 1 on the right, most recent Oct 1  . Thyroid disease   . Hypertension   . COPD (chronic obstructive pulmonary disease) (HCC)   . Asthma   . Chronic back pain   . Sciatica   . Hyponatremia   . Stroke [facility])     seen on CT scan in 2015  . Episodic lightheadedness     since cerebral aneurysm repair 10/2013  . Atrial fibrillation (HCC)   . S/P mitral valve replacement with metallic valve 01/08/2015  . S/P aortic valve replacement with metallic valve 01/09/2015    [facility] 1995  . 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 Left coronary system traverses lateral to pulmonary artery.   . Depression     Past Surgical History  Procedure Laterality Date  . Brain surgery      aneurysm stent placed 2015  . Cardiac surgery    . Cholecystectomy    . Appendectomy    . Cataract extraction    . Cardiac catheterization N/A 01/09/2015    Procedure: Left Heart Cath and Coronary Angiography;  Surgeon: Lennette Bihari, MD;  Location: [facility] INVASIVE CV LAB;  Service: Cardiovascular;  Laterality: N/A;    Family Psychiatric History: none  Family History:  Family History  Problem Relation Age of Onset  . Cancer Sister   . Cancer Brother   . Kidney failure Father     Social History:   Social History   Social History  . Marital Status: Married    Spouse Name: N/A  . Number of Children: N/A  .  Years of Education: N/A   Social History Main Topics  . Smoking status: Former Smoker -- 1.00 packs/day for 40 years    Types: Cigarettes    Quit date: 01/07/2015  . Smokeless tobacco: Never Used     Comment: 04-27-15 per pt she use E-Vap. and stopped Cigarettes on 01-13-15.  Marland Kitchen Alcohol Use: No     Comment: History of using hydrocodone 4 piils 10mg  eacjh  per day, 04-27-15 per pt no  . Drug Use: Yes    Special: Hydrocodone     Comment: 04-27-2015 per pt not now and stopped Hydrocodone Feb 2017  . Sexual Activity: Yes    Birth Control/ Protection: Post-menopausal   Other Topics Concern  . None   Social History Narrative    Additional Social History: The [place] with 2 sisters and 1 brother. She states that she had a very good  childhood. She did not finish high school but later got a GED. She has one grown son but no grandchildren. 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 She had to stop working due to many medical problems such as having heart valve replacements as well as forebrain stents put in. She states that her husband is very supportive but sometimes gets irritable with her  Allergies:   Allergies  Allergen Reactions  . Sulfa Antibiotics Photosensitivity    Yellow eyes    Metabolic Disorder Labs: Lab Results  Component Value Date   HGBA1C 5.6 03/31/2015   MPG 114 03/31/2015   No results found for: PROLACTIN No results found for: CHOL, TRIG, HDL, CHOLHDL, VLDL, LDLCALC   Current Medications: Current Outpatient Prescriptions  Medication Sig Dispense Refill  . albuterol (PROVENTIL) (2.5 MG/3ML) 0.083% nebulizer solution Take 3 mLs (2.5 mg total) by nebulization every 6 (six) hours as needed for wheezing or shortness of breath. 75 mL 12  . Albuterol Sulfate (VENTOLIN HFA IN) Inhale into the lungs as needed.    . ALPRAZolam (XANAX) 1 MG tablet Take 1 tablet (1 mg total) by mouth 3 (three) times daily as needed for  anxiety. 90 tablet 2  . COUMADIN 5 MG tablet (Follow-up with yr MD on Monday for blood work), Take 7.5 mg daily: For blood blood thinner. (Patient taking differently: (Follow-up with yr MD on Monday, Wednesday, Friday for blood work), Take 7.5 mg on other days: For blood blood thinner.) 10 tablet 0  . digoxin (LANOXIN) 0.25 MG tablet Take 1 tablet (0.25 mg total) by mouth every morning. For heart problems    . DULoxetine (CYMBALTA) 30 MG capsule Take 1 capsule (30 mg total) by mouth 2 (two) times daily. 60 capsule 2  . fluticasone (FLONASE) 50 MCG/ACT nasal spray Place 2 sprays into both nostrils every evening. For allergies  2  . levothyroxine (SYNTHROID, LEVOTHROID) 50 MCG tablet Take 1 tablet (50 mcg total) by mouth at bedtime. For low functioning thyroid gland    . losartan (COZAAR) 50 MG tablet Take 1 tablet (50 mg total) by mouth every evening. For high blood pressure    . metoprolol tartrate (LOPRESSOR) 25 MG tablet Take 0.5 tablets (12.5 mg total) by mouth 2 (two) times daily. For high blood pressure  9  . nicotine (NICODERM CQ) 7 mg/24hr patch Place 1 patch (7 mg total) onto the skin daily. Start when 14 mg patches have finished: For nicotine addiction (Patient taking differently: Place 7 mg onto the skin daily as needed. Start when 14 mg patches have finished: For nicotine addiction) 28 patch 0  . pantoprazole (PROTONIX) 20 MG tablet Take 1 tablet (40 mg) daily: For acid reflux 30 tablet 0  . traZODone (DESYREL) 50 MG tablet Take 1 tablet (50 mg total) by mouth at bedtime and may repeat dose one time if needed. For sleep 60 tablet 2  . umeclidinium bromide (INCRUSE ELLIPTA) 62.5 MCG/INH AEPB Inhale 1 puff into the lungs at bedtime. For COPD     No current facility-administered medications for this visit.    Neurologic: Headache: No Seizure: No Paresthesias:No  Musculoskeletal: Strength & Muscle Tone: within normal limits Gait & Station: normal Patient leans: N/A  Psychiatric  Specialty Exam: Review of Systems  Constitutional: Positive for weight loss and malaise/fatigue.  Musculoskeletal: Positive for back pain.  Psychiatric/Behavioral: Positive for depression, suicidal ideas and substance abuse. The patient is nervous/anxious and has insomnia.  Blood pressure 167/98, pulse 63, height 5\' 7"  (1.702 m), weight 130 lb (58.968 kg).Body mass index is 20.36 kg/(m^2).  General Appearance: Casual, Neat and Well Groomed  Eye Contact:  Good  Speech:  Clear and Coherent  Volume:  Normal  Mood:  Anxious   Affect:  Brighter   Thought Process:  Goal Directed  Orientation:  Full (Time, Place, and Person)  Thought Content:  Rumination  Suicidal Thoughts:  No   Homicidal Thoughts:  No  Memory:  Immediate;   Good Recent;   Fair Remote;   Fair  Judgement:  Poor  Insight:  Lacking  Psychomotor Activity:  Decreased  Concentration:  Poor  Recall:  Fair  Fund of Knowledge:Good  Language: Good  Akathisia:  No  Handed:  Right  AIMS (if indicated):    Assets:  Communication Skills Desire for Improvement Resilience Social Support  ADL's:  Intact  Cognition: WNL  Sleep:  Okay with medication     Treatment Plan Summary: Medication management   The patient will continue Cymbalta but try to increase the dose to 30 mg twice a day. She'll also continue trazodone and Xanax as prescribed. She refuses to continue in counseling. She'll return to see me in 4 weeks or call sooner if needed   Diannia Ruder, MD 5/1/20172:18 PM

## 2015-05-22 ENCOUNTER — Ambulatory Visit (HOSPITAL_COMMUNITY): Payer: Self-pay | Admitting: Psychiatry

## 2015-06-03 ENCOUNTER — Emergency Department (HOSPITAL_COMMUNITY)
Admission: EM | Admit: 2015-06-03 | Discharge: 2015-06-03 | Disposition: A | Discharge status: Home / Self Care | Payer: Medicare Other | Attending: Emergency Medicine | Admitting: Emergency Medicine

## 2015-06-03 ENCOUNTER — Encounter (HOSPITAL_COMMUNITY): Payer: Self-pay | Admitting: *Deleted

## 2015-06-03 DIAGNOSIS — Z7952 Long term (current) use of systemic steroids: Secondary | ICD-10-CM | POA: Diagnosis not present

## 2015-06-03 DIAGNOSIS — R05 Cough: Secondary | ICD-10-CM | POA: Diagnosis not present

## 2015-06-03 DIAGNOSIS — R630 Anorexia: Secondary | ICD-10-CM | POA: Insufficient documentation

## 2015-06-03 DIAGNOSIS — J441 Chronic obstructive pulmonary disease with (acute) exacerbation: Secondary | ICD-10-CM | POA: Insufficient documentation

## 2015-06-03 DIAGNOSIS — R42 Dizziness and giddiness: Secondary | ICD-10-CM | POA: Diagnosis present

## 2015-06-03 DIAGNOSIS — I4891 Unspecified atrial fibrillation: Secondary | ICD-10-CM | POA: Insufficient documentation

## 2015-06-03 DIAGNOSIS — M549 Dorsalgia, unspecified: Secondary | ICD-10-CM | POA: Diagnosis not present

## 2015-06-03 DIAGNOSIS — F1310 Sedative, hypnotic or anxiolytic abuse, uncomplicated: Secondary | ICD-10-CM | POA: Insufficient documentation

## 2015-06-03 DIAGNOSIS — Z8673 Personal history of transient ischemic attack (TIA), and cerebral infarction without residual deficits: Secondary | ICD-10-CM | POA: Diagnosis not present

## 2015-06-03 DIAGNOSIS — Z87891 Personal history of nicotine dependence: Secondary | ICD-10-CM | POA: Diagnosis not present

## 2015-06-03 DIAGNOSIS — E079 Disorder of thyroid, unspecified: Secondary | ICD-10-CM | POA: Diagnosis not present

## 2015-06-03 DIAGNOSIS — I159 Secondary hypertension, unspecified: Secondary | ICD-10-CM | POA: Insufficient documentation

## 2015-06-03 DIAGNOSIS — G478 Other sleep disorders: Secondary | ICD-10-CM | POA: Diagnosis not present

## 2015-06-03 DIAGNOSIS — G8929 Other chronic pain: Secondary | ICD-10-CM | POA: Diagnosis not present

## 2015-06-03 DIAGNOSIS — F329 Major depressive disorder, single episode, unspecified: Secondary | ICD-10-CM | POA: Diagnosis not present

## 2015-06-03 DIAGNOSIS — Z9889 Other specified postprocedural states: Secondary | ICD-10-CM | POA: Diagnosis not present

## 2015-06-03 DIAGNOSIS — Z79899 Other long term (current) drug therapy: Secondary | ICD-10-CM | POA: Insufficient documentation

## 2015-06-03 DIAGNOSIS — I10 Essential (primary) hypertension: Secondary | ICD-10-CM | POA: Diagnosis not present

## 2015-06-03 DIAGNOSIS — F419 Anxiety disorder, unspecified: Secondary | ICD-10-CM | POA: Insufficient documentation

## 2015-06-03 DIAGNOSIS — R197 Diarrhea, unspecified: Secondary | ICD-10-CM | POA: Insufficient documentation

## 2015-06-03 DIAGNOSIS — F32A Depression, unspecified: Secondary | ICD-10-CM

## 2015-06-03 DIAGNOSIS — R5383 Other fatigue: Secondary | ICD-10-CM

## 2015-06-03 LAB — PROTIME-INR
INR: 2.23 — ABNORMAL HIGH (ref 0.00–1.49)
Prothrombin Time: 24.5 seconds — ABNORMAL HIGH (ref 11.6–15.2)

## 2015-06-03 LAB — CBC
HEMATOCRIT: 39.9 % (ref 36.0–46.0)
HEMOGLOBIN: 13.4 g/dL (ref 12.0–15.0)
MCH: 31.5 pg (ref 26.0–34.0)
MCHC: 33.6 g/dL (ref 30.0–36.0)
MCV: 93.7 fL (ref 78.0–100.0)
Platelets: 235 10*3/uL (ref 150–400)
RBC: 4.26 MIL/uL (ref 3.87–5.11)
RDW: 13.2 % (ref 11.5–15.5)
WBC: 6 10*3/uL (ref 4.0–10.5)

## 2015-06-03 LAB — I-STAT TROPONIN, ED
Troponin i, poc: 0.01 ng/mL (ref 0.00–0.08)
Troponin i, poc: 0.03 ng/mL (ref 0.00–0.08)

## 2015-06-03 LAB — RAPID URINE DRUG SCREEN, HOSP PERFORMED
AMPHETAMINES: NOT DETECTED
BENZODIAZEPINES: POSITIVE — AB
Barbiturates: NOT DETECTED
COCAINE: NOT DETECTED
OPIATES: NOT DETECTED
Tetrahydrocannabinol: NOT DETECTED

## 2015-06-03 LAB — BASIC METABOLIC PANEL
ANION GAP: 12 (ref 5–15)
BUN: 8 mg/dL (ref 6–20)
CALCIUM: 9.3 mg/dL (ref 8.9–10.3)
CO2: 24 mmol/L (ref 22–32)
Chloride: 99 mmol/L — ABNORMAL LOW (ref 101–111)
Creatinine, Ser: 0.9 mg/dL (ref 0.44–1.00)
GFR calc Af Amer: >60 mL/min (ref >60)
GFR calc non Af Amer: >60 mL/min (ref >60)
GLUCOSE: 151 mg/dL — AB (ref 65–99)
Potassium: 3.4 mmol/L — ABNORMAL LOW (ref 3.5–5.1)
Sodium: 135 mmol/L (ref 135–145)

## 2015-06-03 LAB — URINALYSIS, ROUTINE W REFLEX MICROSCOPIC
BILIRUBIN URINE: NEGATIVE
GLUCOSE, UA: NEGATIVE mg/dL
HGB URINE DIPSTICK: NEGATIVE
Ketones, ur: NEGATIVE mg/dL
Nitrite: NEGATIVE
PH: 7.5 (ref 5.0–8.0)
Protein, ur: NEGATIVE mg/dL
SPECIFIC GRAVITY, URINE: 1.016 (ref 1.005–1.030)

## 2015-06-03 LAB — DIGOXIN LEVEL: DIGOXIN LVL: 1 ng/mL (ref 0.8–2.0)

## 2015-06-03 LAB — URINE MICROSCOPIC-ADD ON
Bacteria, UA: NONE SEEN
RBC / HPF: NONE SEEN RBC/hpf (ref 0–5)
Squamous Epithelial / LPF: NONE SEEN

## 2015-06-03 LAB — ETHANOL

## 2015-06-03 LAB — TSH: TSH: 1.14 u[IU]/mL (ref 0.350–4.500)

## 2015-06-03 MED ORDER — LOSARTAN POTASSIUM 50 MG PO TABS
50.0000 mg | ORAL_TABLET | Freq: Once | ORAL | Status: DC
  Filled 2015-06-03: qty 1

## 2015-06-03 MED ORDER — SODIUM CHLORIDE 0.9 % IV SOLN
Freq: Once | INTRAVENOUS | Status: AC
  Administered 2015-06-03: 16:00:00 via INTRAVENOUS

## 2015-06-03 NOTE — BH Assessment (Addendum)
Tele Assessment Note   Jasmine West is an 70 y.o. female. Pt presents voluntarily to [facility] with complaint of SOB and weakness. Per chart review, pt was admitted to [facility] March 2017 for MDD and SI with plan to shoot herself with loaded gun. Pt currently denies SI. She denies HI and denies [facility]. No delusions noted. She describes her mood as "low". Pt endorses poor appetite (10 lbs weight loss since Jan 2017), isolating bx, fatigue, and low motivation. She says she used to keep up with the housework, but she hasn't done housework for approx one month. Pt says, "I just want to feel better all around." She says she doesn't think the Cymbalta prescribed by Dr Tenny Craw is helping. Pt becomes visibly anxious during teleassessment when writer's son at bedside asks writer about treatment options for pt. She appears to be trying to get off the bed, but she scoots back on bed to sitting position when writer encourages her to do so. She reports frequent panic attacks with most recent panic attack earlier today. When writer mentions possibilty of going to [facility] IOP, pt states that she won't go because she has no motivation. Pt had been abusing Lortab daily for five years (buying it from a neighbor), and her last use was Feb 2017. Pt denies current substance use. 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 Collateral info provided by pt's husband, Chriss Redel. He says he became worried today b/c she was having tremors and wouldn't get out of bed. He reports she usually lies on the couch most of the day, but this was the first time she wouldn't get out of bed. He says she had a brain stent 1.5 yrs ago. He says she has been holding her head differently since the stent. Husband says that she had been taking Cymbalta 60 mg but it made her tired so Ross reduced her dose to 30 mg. He says pt was admitted for two night to a psychiatric  hosipital in [place] in 2007 for SI. Husband reports he is considering calling her MD at [facility] who inserted the brain stent. Writer also encouraged pt to call Dr Tenny Craw tomorrow.   Diagnosis:  Major Depressive Disorder, Recurrent, Severe without Psychotic Features  Unspecified Anxiety Disorder  Past Medical History:  Past Medical History  Diagnosis Date  . Brain aneurysm 10/20/13    3 on lt side and 1 on the right, most recent Oct 1  . Thyroid disease   . Hypertension   . COPD (chronic obstructive pulmonary disease) (HCC)   . Asthma   . Chronic back pain   . Sciatica   . Hyponatremia   . Stroke [facility])     seen on CT scan in 2015  . Episodic lightheadedness     since cerebral aneurysm repair 10/2013  . Atrial fibrillation (HCC)   . S/P mitral valve replacement with metallic valve 01/08/2015  . S/P aortic valve replacement with metallic valve 01/09/2015    [facility] 1995  . 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 Left coronary system traverses lateral to pulmonary artery.   . Depression     Past Surgical History  Procedure Laterality Date  . Brain surgery      aneurysm stent placed 2015  . Cardiac surgery    . Cholecystectomy    .  Appendectomy    . Cataract extraction    . Cardiac catheterization N/A 01/09/2015    Procedure: Left Heart Cath and Coronary Angiography;  Surgeon: Lennette Bihari, MD;  Location: [facility] INVASIVE CV LAB;  Service: Cardiovascular;  Laterality: N/A;    Family History:  Family History  Problem Relation Age of Onset  . Cancer Sister   . Cancer Brother   . Kidney failure Father     Social History:  reports that she quit smoking about 4 months ago. Her smoking use included Cigarettes. She has a 40 pack-year smoking history. She has never used smokeless tobacco. She reports that she uses illicit drugs (Hydrocodone). She reports that she does not drink alcohol.  Additional Social History:   Alcohol / Drug Use Pain Medications: pt denies abuse - see pta meds list Prescriptions: pt denies abuse - see pta meds list Over the Counter: pt denies abuse - see pta meds list History of alcohol / drug use?: Yes Longest period of sobriety (when/how long): 90 Negative Consequences of Use: Financial, Personal relationships Substance #1 Name of Substance 1: hydrocodone/lortab 1 - Age of First Use: 61 1 - Frequency: daily 1 - Duration: 5 years 1 - Last Use / Amount: Feb 2017  CIWA: CIWA-Ar BP: 193/86 mmHg Pulse Rate: 65 COWS:    PATIENT STRENGTHS: (choose at least two) Average or above average intelligence Communication skills Supportive family/friends  Allergies:  Allergies  Allergen Reactions  . Sulfa Antibiotics Photosensitivity    Yellow eyes    Home Medications:  (Not in a hospital admission)  OB/GYN Status:  No LMP recorded. Patient is postmenopausal.  General Assessment Data Location of Assessment: [facility] ED TTS Assessment: In system Is this a Tele or Face-to-Face Assessment?: Tele Assessment Is this an Initial Assessment or a Re-assessment for this encounter?: Initial Assessment Marital status: Married Is patient pregnant?: No Pregnancy Status: No Living Arrangements: Spouse/significant other (husband) Can pt return to current living arrangement?: Yes Admission Status: Voluntary Is patient capable of signing voluntary admission?: Yes Referral Source: Self/Family/Friend Insurance type: medicare     Crisis Care Plan Living Arrangements: Spouse/significant other (husband) Name of Psychiatrist: Dr Tenny Craw (has seen once) Name of Therapist: none  Education Status Is patient currently in school?: No Highest grade of school patient has completed:  (GED)  Risk to self with the past 6 months Suicidal Ideation: No Has patient been a risk to self within the past 6 months prior to admission? : No Suicidal Intent: No Has patient had any suicidal intent within the  past 6 months prior to admission? : Yes Is patient at risk for suicide?: No Suicidal Plan?: No Has patient had any suicidal plan within the past 6 months prior to admission? : Yes Access to Means:  (n/a) What has been your use of drugs/alcohol within the last 12 months?: none - no opioid use since Feb 2017 Previous Attempts/Gestures: Yes (pt threatened SI with loaded gun in March 2017 ) Other Self Harm Risks: none Intentional Self Injurious Behavior: None Family Suicide History: Unknown Recent stressful life event(s): Other (Comment), Recent negative physical changes (weakness, tremors, panic attacks) Persecutory voices/beliefs?: No Depression: Yes Depression Symptoms: Loss of interest in usual pleasures, Fatigue, Isolating (poor appetite) Substance abuse history and/or treatment for substance abuse?: Yes Suicide prevention information given to non-admitted patients: Not applicable  Risk to Others within the past 6 months Homicidal Ideation: No Does patient have any lifetime risk of violence toward others beyond the six months prior  to admission? : No Thoughts of Harm to Others: No Current Homicidal Intent: No Current Homicidal Plan: No Access to Homicidal Means: No Identified Victim: none History of harm to others?: No Assessment of Violence: None Noted Violent Behavior Description: pt denies hx violence - is cooperative Does patient have access to weapons?: Yes (Comment) (pt reports guns in the home) Criminal Charges Pending?: No Does patient have a court date: No Is patient on probation?: No  Psychosis Hallucinations: None noted Delusions: None noted  Mental Status Report Appearance/Hygiene: Unremarkable, In hospital gown Eye Contact: Good Motor Activity: Freedom of movement, Restlessness Speech: Logical/coherent Level of Consciousness: Alert, Quiet/awake, Restless Mood: Depressed, Anxious, Sad, Anhedonia Affect: Appropriate to circumstance, Anxious Anxiety Level:  Panic Attacks Panic attack frequency: few times weekly Most recent panic attack: today Thought Processes: Relevant, Coherent Judgement: Unimpaired Orientation: Person, Time, Situation, Place Obsessive Compulsive Thoughts/Behaviors: None  Cognitive Functioning Concentration: Decreased Memory: Remote Intact, Recent Intact IQ: Average Insight: Fair Impulse Control: Fair Appetite: Poor Weight Loss: 10 (in past 5 mos) Sleep: No Change Total Hours of Sleep: 5 Vegetative Symptoms: Staying in bed  ADLScreening [facility] Assessment Services) Patient's cognitive ability adequate to safely complete daily activities?: Yes Patient able to express need for assistance with ADLs?: Yes Independently performs ADLs?: Yes (appropriate for developmental age)  Prior Inpatient Therapy Prior Inpatient Therapy: Yes Prior Therapy Dates: March 2017 and 2007 Prior Therapy Facilty/Provider([facility]): [facility] Reason for Treatment: depression, SI   Prior Outpatient Therapy Prior Outpatient Therapy: Yes Prior Therapy Dates: May Prior Therapy Facilty/Provider(s): Dr Tenny Craw at [facility] Reason for Treatment: MDD, med management Does patient have an ACCT team?: No Does patient have Intensive In-House Services?  : No Does patient have [facility] services? : No Does patient have P4CC services?: No  ADL Screening (condition at time of admission) Patient's cognitive ability adequate to safely complete daily activities?: Yes Is the patient deaf or have difficulty hearing?: No Does the patient have difficulty seeing, even when wearing glasses/contacts?: No Does the patient have difficulty concentrating, remembering, or making decisions?: No Patient able to express need for assistance with ADLs?: Yes Does the patient have difficulty dressing or bathing?: No Independently performs ADLs?: Yes (appropriate for developmental age) Does the patient have difficulty walking or climbing stairs?: No Weakness of  Legs: None Weakness of Arms/Hands: None  Home Assistive Devices/Equipment Home Assistive Devices/Equipment: None    Abuse/Neglect Assessment (Assessment to be complete while patient is alone) Physical Abuse: Denies Verbal Abuse: Denies Sexual Abuse: Denies Exploitation of patient/patient's resources: Denies Self-Neglect: Denies     Merchant navy officer (For Healthcare) Does patient have an advance directive?: No Would patient like information on creating an advanced directive?: No - patient declined information    Additional Information 1:1 In Past 12 Months?: No CIRT Risk: No Elopement Risk: No Does patient have medical clearance?: No     Disposition:  Disposition Initial Assessment Completed for this Encounter: Yes Disposition of Patient: Outpatient treatment Type of outpatient treatment: Adult Hillery Jacks NP recommends outpatient treatment )   Writer spoke w/ EDP Smith and updated her on Lewis NP's recommendation for pt to be discharged and follow up with Dr Tenny Craw tomorrow. Writer then spoke w/ pt's husband and told her TTS recommendations. Husband promises to immediately remove the guns from their house. Writer and husband discuss importance of getting PCP for pt and husband says that he will call [facility] to get appt for pt with MD who did brain stent.  Nixon Sparr P 06/03/2015 4:33 PM

## 2015-06-03 NOTE — ED Provider Notes (Signed)
CSN: [id number]     Arrival date & time 06/03/15  1333 History   None    Chief Complaint  Patient presents with  . Shortness of Breath  . Dizziness     (Consider location/radiation/quality/duration/timing/severity/associated sxs/prior Treatment) HPI 70 year old female with major depressive disorder, hypertension, COPD, polysubstance use, chronic clinic dependence who presents with generally not feeling well. Patient is a poor historian and had a hard time pinpointing when she started to feel bad. Son and has been in the room and states it has been a couple of months. Symptoms include generalized fatigue, loss of interest in her normal activities and not when he got out of bed. She associates this with starting Cymbalta about a month ago. She states she's previously on Prozac which also causes her to feel bad. Patient was admitted to the hospital from March 10 through April 04, 2015 after being found with a gun and threatening to kill herself. Prior tot hat she been buying Lortab off the street. She had stopped taking the Lortab and became suicidal during withdrawal. While the hospital she was started on several left which she self discontinued as she felt it was not making her feel well. Patient has had 2 follow-up with a psychiatrist since discharge from the hospital. She reports she has continued to feel depressed and anxious and does not think it is helping to see psychiatry.  She was started on Cymbalta with the thought it would help with her chronic back pain and her depression. This was subsequently reduced to 30 mg on May 1 she felt this most is making her very tired. She has a taking Xanax 3 times a day and trazodone for sleep. Denies any new medications. She also had a follow-up with her primary care doctor which she has not had before coming up this week. Family states that she has been making statements saying she does not want to go to this appointment. The family expresses concern that  patient is not any better since her hospital discharge and continues to be very depressed. She is not expressing suicidal but they're worried about leaving her alone at home. Past Medical History  Diagnosis Date  . Brain aneurysm 10/20/13    3 on lt side and 1 on the right, most recent Oct 1  . Thyroid disease   . Hypertension   . COPD (chronic obstructive pulmonary disease) (HCC)   . Asthma   . Chronic back pain   . Sciatica   . Hyponatremia   . Stroke [facility])     seen on CT scan in 2015  . Episodic lightheadedness     since cerebral aneurysm repair 10/2013  . Atrial fibrillation (HCC)   . S/P mitral valve replacement with metallic valve 01/08/2015  . S/P aortic valve replacement with metallic valve 01/09/2015    [facility] 1995  . 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 Left coronary system traverses lateral to pulmonary artery.   . Depression    Past Surgical History  Procedure Laterality Date  . Brain surgery      aneurysm stent placed 2015  . Cardiac surgery    . Cholecystectomy    . Appendectomy    . Cataract extraction    . Cardiac catheterization N/A 01/09/2015    Procedure: Left Heart Cath and Coronary Angiography;  Surgeon: Lennette Bihari, MD;  Location: [facility] INVASIVE CV LAB;  Service: Cardiovascular;  Laterality: N/A;   Family History  Problem Relation Age of Onset  . Cancer Sister   . Cancer Brother   . Kidney failure Father    Social History  Substance Use Topics  . Smoking status: Former Smoker -- 1.00 packs/day for 40 years    Types: Cigarettes    Quit date: 01/07/2015  . Smokeless tobacco: Never Used     Comment: 04-27-15 per pt she use E-Vap. and stopped Cigarettes on 01-13-15.  Marland Kitchen Alcohol Use: No     Comment: History of using hydrocodone 4 piils 10mg  eacjh  per day, 04-27-15 per pt no   OB History    No data available     Review of Systems  Constitutional: Positive for activity change, appetite  change and fatigue. Negative for chills.  HENT: Negative for congestion and rhinorrhea.   Eyes: Negative for visual disturbance.  Respiratory: Positive for cough (chronic) and shortness of breath (chronic).   Cardiovascular: Negative for chest pain, palpitations and leg swelling.  Gastrointestinal: Positive for diarrhea (1 episode a few days ago, resolved). Negative for nausea, vomiting and abdominal pain.  Genitourinary: Negative for dysuria and difficulty urinating.  Musculoskeletal: Positive for back pain (chronic, intermittent).  Skin: Negative for pallor and rash.  Neurological: Negative for dizziness and headaches.  Psychiatric/Behavioral: Positive for sleep disturbance and dysphoric mood. The patient is nervous/anxious.       Allergies  Sulfa antibiotics  Home Medications   Prior to Admission medications   Medication Sig Start Date End Date Taking? Authorizing Provider  albuterol (PROVENTIL) (2.5 MG/3ML) 0.083% nebulizer solution Take 3 mLs (2.5 mg total) by nebulization every 6 (six) hours as needed for wheezing or shortness of breath. 04/04/15   Sanjuana Kava, NP  Albuterol Sulfate (VENTOLIN HFA IN) Inhale into the lungs as needed.    Historical Provider, MD  ALPRAZolam Prudy Feeler) 1 MG tablet Take 1 tablet (1 mg total) by mouth 3 (three) times daily as needed for anxiety. 04/27/15   Myrlene Broker, MD  COUMADIN 5 MG tablet (Follow-up with yr MD on Monday for blood work), Take 7.5 mg daily: For blood blood thinner. Patient taking differently: (Follow-up with yr MD on Monday, Wednesday, Friday for blood work), Take 7.5 mg on other days: For blood blood thinner. 04/04/15   Sanjuana Kava, NP  digoxin (LANOXIN) 0.25 MG tablet Take 1 tablet (0.25 mg total) by mouth every morning. For heart problems 04/04/15   Sanjuana Kava, NP  DULoxetine (CYMBALTA) 30 MG capsule Take 1 capsule (30 mg total) by mouth 2 (two) times daily. 05/21/15 05/20/16  Myrlene Broker, MD  fluticasone (FLONASE) 50 MCG/ACT  nasal spray Place 2 sprays into both nostrils every evening. For allergies 04/04/15   Sanjuana Kava, NP  levothyroxine (SYNTHROID, LEVOTHROID) 50 MCG tablet Take 1 tablet (50 mcg total) by mouth at bedtime. For low functioning thyroid gland 04/04/15   Sanjuana Kava, NP  losartan (COZAAR) 50 MG tablet Take 1 tablet (50 mg total) by mouth every evening. For high blood pressure 04/04/15   Sanjuana Kava, NP  metoprolol tartrate (LOPRESSOR) 25 MG tablet Take 0.5 tablets (12.5 mg total) by mouth 2 (two) times daily. For high blood pressure 04/04/15   Sanjuana Kava, NP  nicotine (NICODERM CQ) 7 mg/24hr patch Place 1 patch (7 mg total) onto the skin daily. Start when 14 mg patches have finished: For nicotine addiction Patient taking differently: Place 7 mg onto the skin daily as  needed. Start when 14 mg patches have finished: For nicotine addiction 04/04/15   Sanjuana KavaAgnes I Nwoko, NP  pantoprazole (PROTONIX) 20 MG tablet Take 1 tablet (40 mg) daily: For acid reflux 04/04/15   Sanjuana KavaAgnes I Nwoko, NP  traZODone (DESYREL) 50 MG tablet Take 1 tablet (50 mg total) by mouth at bedtime and may repeat dose one time if needed. For sleep 05/21/15   Myrlene Brokereborah R Ross, MD  umeclidinium bromide (INCRUSE ELLIPTA) 62.5 MCG/INH AEPB Inhale 1 puff into the lungs at bedtime. For COPD 04/04/15   Sanjuana KavaAgnes I Nwoko, NP   BP 168/62 mmHg  Pulse 61  Temp(Src) 97.9 F (36.6 C) (Oral)  Resp 20  SpO2 98% Physical Exam  Constitutional: She is oriented to person, place, and time. She appears well-developed and well-nourished.  Flat affect, poor eye contact  HENT:  Head: Normocephalic and atraumatic.  Eyes: EOM are normal. Pupils are equal, round, and reactive to light.  Neck: Normal range of motion. Neck supple.  Cardiovascular: Normal rate, regular rhythm and intact distal pulses.   Pulmonary/Chest: Effort normal. She has wheezes (scattered expiratory wheezes).  Abdominal: Soft. She exhibits no distension. There is no tenderness.  Musculoskeletal:  Normal range of motion. She exhibits no edema or tenderness.  Neurological: She is alert and oriented to person, place, and time.  Skin: Skin is warm and dry. No rash noted.  Psychiatric: Her affect is blunt. She is withdrawn. Thought content is not delusional. She exhibits a depressed mood. She expresses no homicidal and no suicidal ideation. She expresses no suicidal plans.  Nursing note and vitals reviewed.   ED Course  Procedures (including critical care time) Labs Review Labs Reviewed  BASIC METABOLIC PANEL - Abnormal; Notable for the following:    Potassium 3.4 (*)    Chloride 99 (*)    Glucose, Bld 151 (*)    All other components within normal limits  URINALYSIS, ROUTINE W REFLEX MICROSCOPIC ([facility]) - Abnormal; Notable for the following:    Leukocytes, UA SMALL (*)    All other components within normal limits  PROTIME-INR - Abnormal; Notable for the following:    Prothrombin Time 24.5 (*)    INR 2.23 (*)    All other components within normal limits  URINE RAPID DRUG SCREEN, HOSP PERFORMED - Abnormal; Notable for the following:    Benzodiazepines POSITIVE (*)    All other components within normal limits  CBC  ETHANOL  TSH  URINE MICROSCOPIC-ADD ON  DIGOXIN LEVEL  I-STAT TROPOININ, ED  I-STAT TROPOININ, ED    Imaging Review No results found. I have personally reviewed and evaluated these images and lab results as part of my medical decision-making.   EKG Interpretation   Date/Time:  Sunday Jun 03 2015 13:41:05 EDT Ventricular Rate:  82 PR Interval:    QRS Duration: 114 QT Interval:  384 QTC Calculation: 448 R Axis:   -80 Text Interpretation:  Atrial fibrillation Left axis deviation Incomplete  right bundle branch block Minimal voltage criteria for LVH, may be normal  variant Anteroseptal infarct , age undetermined Marked ST abnormality,  possible lateral subendocardial injury Abnormal ECG Confirmed by ZAVITZ  MD, Ivin BootyJOSHUA [phone]([id number]) on 06/03/2015 3:08:21 PM       MDM   Final diagnoses:  Secondary hypertension, unspecified  Depression  Fatigue due to depression    Afebrile, mildly hypertensive with frequent PVCs on the cardiac monitor but normal heart rate. Patient is very blunt and withdrawn on exam. She is denying  suicidal ideation, homicidal ideation or hallucinations. Family expresses concern patient is a danger to herself. EKG shows LVH with ST depressions in lateral leads that are more marked compared with prior. CBC, CMP are normal. INR is therapeutic. Plan is to obtain troponins and digoxin level. Additional psych tox labs are pending. IV fluids given. Psychiatrist consulted to evaluate the patient and provide further recommendations. UDS is positive for benzos which is consistent with patient's reported Xanax prescription and use. Other tox labs are negative.   Delta troponin within normal limits and digoxin level is normal. Discussed patient's EKG changes, with cardiology. Per cardiology they do not feel that her ST changes are significantly different compared with prior. She does have worsening LVH which may be secondary to hypertension. Advised patient needs better blood pressure control. Patient was evaluated by psychiatry who do not feel like she is in imminent danger to herself or others.  She is stable for discharge. Psychiatry discussed with family that they need to lock up or get rid of guns in the home is also any other pain or medicine that the patient may use to hurt herself. Family is in agreement with plan.  Patient's symptoms are likely due to major depressive disorder. She does not have any significant lab abnormalities concerning for electrolyte imbalance or other pathology. Delta troponin is negative and EKG is unchanged compared to prior. Advised patient that she needs to follow-up with her primary care doctor to discuss her blood pressure control. She reports she has appointment coming up this week with a new primary care  doctor. Reinforced with patient and family that she needs a primary care doctor to help according her care and manage her medications. In addition advised that she should call her psychiatrist tomorrow to set up a close follow-up appointment to discuss her ongoing depression and medication management. Family also requested referral to neurology which was given. Discharged without further acute events during my care.  Discussed with Dr. Jodi Mourning, ED attending  Isa Rankin, MD 06/04/15 [id number]  Blane Ohara, MD 06/04/15 [phone]

## 2015-06-03 NOTE — ED Notes (Signed)
PT reports she is so weak and has been for a while. PT thinks cymbalta is not working for her.  Pt reports sob and reports dizziness.  Reports spasms and shaking all over. States the weakness will make her fall down.

## 2015-06-04 ENCOUNTER — Telehealth (HOSPITAL_COMMUNITY): Payer: Self-pay | Admitting: *Deleted

## 2015-06-04 ENCOUNTER — Ambulatory Visit (HOSPITAL_COMMUNITY): Payer: Self-pay | Admitting: Psychiatry

## 2015-06-04 NOTE — Telephone Encounter (Signed)
phone call to patient to schedule an appointment at Dr. Tenny Crawoss request.   Spoke with patient and she said she can't come to an appointment due to her husband has appointments and they are having car trouble.  She said she will be okay until 06/20/15.  She said she can't see coming before 06/20/15.

## 2015-06-04 NOTE — Telephone Encounter (Signed)
noted 

## 2015-06-05 ENCOUNTER — Encounter (HOSPITAL_COMMUNITY): Payer: Self-pay | Admitting: Psychiatry

## 2015-06-05 ENCOUNTER — Ambulatory Visit (INDEPENDENT_AMBULATORY_CARE_PROVIDER_SITE_OTHER): Payer: Medicare Other | Admitting: Psychiatry

## 2015-06-05 VITALS — BP 149/65 | HR 64 | Ht 67.0 in | Wt 124.4 lb

## 2015-06-05 DIAGNOSIS — F322 Major depressive disorder, single episode, severe without psychotic features: Secondary | ICD-10-CM | POA: Diagnosis not present

## 2015-06-05 NOTE — Progress Notes (Signed)
Patient ID: Jasmine West, female   DOB: 04/21/1945, 70 y.o.   MRN: [id number] Patient ID: Jasmine West, female   DOB: 1945-02-02, 70 y.o.   MRN: [id number]  Psychiatric  Adult follow-up  Patient Identification: Jasmine West MRN:  [id number] Date of Evaluation:  06/05/2015 Referral Source: Behavioral health hospital Chief Complaint:   Chief Complaint    Depression; Fatigue; Anxiety; Follow-up     Visit Diagnosis:    ICD-9-CM ICD-10-CM   1. Severe single current episode of major depressive disorder, without psychotic features (HCC) 296.23 F32.2     History of Present Illness:  This patient is a 70 year old married white female who lives with her husband in [place]. She is a retired Interior and spatial designer.  The patient was referred by the behavioral health hospital where she was inpatient from March 10 through 04/04/2015. The patient was admitted after she was found with a gun by her husband and son threatening to kill herself. 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 She started using it several years ago for back pain. She stated that her doctor "wasn't giving me enough" so she started getting it from friends and off the street. She was using 4-5 tablets a day. She also has a history of anxiety ever since she had valve replacement surgery in 1995 and has been on Xanax chronically. However she states that she does not abuse Xanax.  On February 10 the patient stated that she had had enough of buying the Lortab illegally and stopped it cold [place]. She went through withdrawal symptoms such as nausea anxiety depression unable to function or think agitation and anxiety. She felt so desperate that she became suicidal. While at the hospital she was started on Zoloft which she has since discontinued because she worried it would interact with the digoxin that she is on. She's continued Xanax from her cardiologist and trazodone from the hospital which has helped her  sleep.  Since coming back from the hospital 3 weeks ago which she states that she's not a whole lot better. She still feels depressed and anxious. She can't concentrate or function very well and is not doing her normal task such as housework or paying bills. She is sleeping better with the trazodone. She states that she withdrawn from people as not going to church or interacting with friends. She feels very helpless and hopeless. However she states that she would never kill herself or do this to her family. She is close to her niece and sister and other friends. She is willing to try another antidepressant and I suggested Cymbalta because it would help with both chronic pain and depression. She is on no pain medicine now and states that her back pain comes and goes. She mostly goes to specialist such as cardiologist and doesn't have a primary doctor and I urged her to get one to begin the process of pain management  The patient returns after 2 weeks with her husband. She was in the ED over the weekend and they sent me a note. Apparently she was very shaky but also very fatigue and inhale to get out of bed. We had cut back her Cymbalta to 30 mg daily because it was making her feel bad. While in the ED she had complete lab workup and everything looked normal. Her EKG was checked and cardiology was consulted. They could not find anything significantly wrong medically. She states that since she's been on Cymbalta she is  not sleeping well but she's taking it at bedtime and I suggested moving it to morning. She's very anxious and can't make decisions. She waits till she's very panicky before she takes the Xanax and I suggested she take them on a scheduled basis 3 times a day. She states she is not sleeping well but her husband claims that she has. She still has no appetite and has lost another 4 pounds. She is starting with a new primary doctor tomorrow but I strongly suggested she drink boost or other supplements to  get her weight back up and also eat more snacks. She still adamantly refuses to go to counseling here  Associated Signs/Symptoms: Depression Symptoms:  depressed mood, anhedonia, insomnia, psychomotor agitation, psychomotor retardation, fatigue, feelings of worthlessness/guilt, difficulty concentrating, hopelessness, suicidal thoughts without plan, anxiety, panic attacks, loss of energy/fatigue, disturbed sleep, weight loss, (Hypo) Manic Symptoms:  Irritable Mood, Anxiety Symptoms:  Excessive Worry, Panic Symptoms, Social Anxiety,   Past Psychiatric History: The patient had no prior psychiatric history before the admission last month  Previous Psychotropic Medications: Yes   Substance Abuse History in the last 12 months:  Yes.    Consequences of Substance Abuse: Medical Consequences:  Withdrawal from narcotics cause severe depression and agitation and suicidal ideation. She ended up in the hospital at behavioral health Withdrawal Symptoms:   Nausea  Past Medical History:  Past Medical History  Diagnosis Date  . Brain aneurysm 10/20/13    3 on lt side and 1 on the right, most recent Oct 1  . Thyroid disease   . Hypertension   . COPD (chronic obstructive pulmonary disease) (HCC)   . Asthma   . Chronic back pain   . Sciatica   . Hyponatremia   . Stroke [facility])     seen on CT scan in 2015  . Episodic lightheadedness     since cerebral aneurysm repair 10/2013  . Atrial fibrillation (HCC)   . S/P mitral valve replacement with metallic valve 01/08/2015  . S/P aortic valve replacement with metallic valve 01/09/2015    [facility] 1995  . 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 Left coronary system traverses lateral to pulmonary artery.   . Depression     Past Surgical History  Procedure Laterality Date  . Brain surgery      aneurysm stent placed 2015  . Cardiac surgery    . Cholecystectomy    . Appendectomy     . Cataract extraction    . Cardiac catheterization N/A 01/09/2015    Procedure: Left Heart Cath and Coronary Angiography;  Surgeon: Lennette Bihari, MD;  Location: [facility] INVASIVE CV LAB;  Service: Cardiovascular;  Laterality: N/A;    Family Psychiatric History: none  Family History:  Family History  Problem Relation Age of Onset  . Cancer Sister   . Cancer Brother   . Kidney failure Father     Social History:   Social History   Social History  . Marital Status: Married    Spouse Name: N/A  . Number of Children: N/A  . Years of Education: N/A   Social History Main Topics  . Smoking status: Former Smoker -- 1.00 packs/day for 40 years    Types: Cigarettes    Quit date: 01/07/2015  . Smokeless tobacco: Never Used     Comment: 04-27-15 per pt she use E-Vap. and stopped Cigarettes on 01-13-15.  Marland Kitchen Alcohol Use: No  Comment: History of using hydrocodone 4 piils 10mg  eacjh  per day, 04-27-15 per pt no  . Drug Use: Yes    Special: Hydrocodone     Comment: 04-27-2015 per pt not now and stopped Hydrocodone Feb 2017  . Sexual Activity: Yes    Birth Control/ Protection: Post-menopausal   Other Topics Concern  . Not on file   Social History Narrative    Additional Social History: The [place] with 2 sisters and 1 brother. She states that she had a very good childhood. She did not finish high school but later got a GED. She has one grown son but no grandchildren. 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 She had to stop working due to many medical problems such as having heart valve replacements as well as forebrain stents put in. She states that her husband is very supportive but sometimes gets irritable with her  Allergies:   Allergies  Allergen Reactions  . Sulfa Antibiotics Photosensitivity    Yellow eyes    Metabolic Disorder Labs: Lab Results  Component Value Date   HGBA1C 5.6 03/31/2015   MPG 114 03/31/2015   No  results found for: PROLACTIN No results found for: CHOL, TRIG, HDL, CHOLHDL, VLDL, LDLCALC   Current Medications: Current Outpatient Prescriptions  Medication Sig Dispense Refill  . acetaminophen (TYLENOL) 500 MG tablet Take 1,000 mg by mouth every 6 (six) hours as needed for moderate pain.    Marland Kitchen. albuterol (PROVENTIL HFA;VENTOLIN HFA) 108 (90 Base) MCG/ACT inhaler Inhale 2 puffs into the lungs every 6 (six) hours as needed for wheezing or shortness of breath.    Marland Kitchen. albuterol (PROVENTIL) (2.5 MG/3ML) 0.083% nebulizer solution Take 3 mLs (2.5 mg total) by nebulization every 6 (six) hours as needed for wheezing or shortness of breath. 75 mL 12  . ALPRAZolam (XANAX) 1 MG tablet Take 1 tablet (1 mg total) by mouth 3 (three) times daily as needed for anxiety. 90 tablet 2  . COUMADIN 5 MG tablet (Follow-up with yr MD on Monday for blood work), Take 7.5 mg daily: For blood blood thinner. (Patient taking differently: Take 5-7.5 mg by mouth daily at 6 PM. TAKES 5MG  ON M,W,F TAKES 7.5MG  ALL OTHER DAYS) 10 tablet 0  . digoxin (LANOXIN) 0.25 MG tablet Take 1 tablet (0.25 mg total) by mouth every morning. For heart problems (Patient taking differently: Take 0.25 mg by mouth every evening. For heart problems)    . DULoxetine (CYMBALTA) 30 MG capsule Take 1 capsule (30 mg total) by mouth 2 (two) times daily. (Patient taking differently: Take 30 mg by mouth every evening. ) 60 capsule 2  . fluticasone (FLONASE) 50 MCG/ACT nasal spray Place 2 sprays into both nostrils every evening. For allergies  2  . levothyroxine (SYNTHROID, LEVOTHROID) 50 MCG tablet Take 1 tablet (50 mcg total) by mouth at bedtime. For low functioning thyroid gland (Patient taking differently: Take 50 mcg by mouth daily before breakfast. For low functioning thyroid gland)    . losartan (COZAAR) 50 MG tablet Take 1 tablet (50 mg total) by mouth every evening. For high blood pressure    . metoprolol tartrate (LOPRESSOR) 25 MG tablet Take 0.5 tablets  (12.5 mg total) by mouth 2 (two) times daily. For high blood pressure (Patient taking differently: Take 12.5 mg by mouth every evening. For high blood pressure)  9  . montelukast (SINGULAIR) 10 MG tablet Take 10 mg by mouth at  bedtime.  2  . nicotine (NICODERM CQ) 7 mg/24hr patch Place 1 patch (7 mg total) onto the skin daily. Start when 14 mg patches have finished: For nicotine addiction (Patient taking differently: Place 7 mg onto the skin daily as needed. Start when 14 mg patches have finished: For nicotine addiction) 28 patch 0  . pantoprazole (PROTONIX) 20 MG tablet Take 1 tablet (40 mg) daily: For acid reflux (Patient taking differently: Take 40 mg by mouth daily as needed for heartburn. Take 1 tablet (40 mg) daily: For acid reflux) 30 tablet 0  . traZODone (DESYREL) 50 MG tablet Take 1 tablet (50 mg total) by mouth at bedtime and may repeat dose one time if needed. For sleep 60 tablet 2  . umeclidinium bromide (INCRUSE ELLIPTA) 62.5 MCG/INH AEPB Inhale 1 puff into the lungs at bedtime. For COPD     No current facility-administered medications for this visit.    Neurologic: Headache: No Seizure: No Paresthesias:No  Musculoskeletal: Strength & Muscle Tone: within normal limits Gait & Station: normal Patient leans: N/A  Psychiatric Specialty Exam: Review of Systems  Constitutional: Positive for weight loss and malaise/fatigue.  Musculoskeletal: Positive for back pain.  Psychiatric/Behavioral: Positive for depression, suicidal ideas and substance abuse. The patient is nervous/anxious and has insomnia.     Blood pressure 149/65, pulse 64, height 5\' 7"  (1.702 m), weight 124 lb 6.4 oz (56.427 kg), SpO2 98 %.Body mass index is 19.48 kg/(m^2).  General Appearance: Casual, Neat and Well Groomed  Eye Contact:  Good  Speech:  Clear and Coherent  Volume:  Normal  Mood:  Anxious   Affect:  Nervous somewhat irritable   Thought Process:  Goal Directed  Orientation:  Full (Time, Place, and  Person)  Thought Content:  Rumination  Suicidal Thoughts:  No   Homicidal Thoughts:  No  Memory:  Immediate;   Good Recent;   Fair Remote;   Fair  Judgement:  Poor  Insight:  Lacking  Psychomotor Activity:  Decreased  Concentration:  Poor  Recall:  Fair  Fund of Knowledge:Good  Language: Good  Akathisia:  No  Handed:  Right  AIMS (if indicated):    Assets:  Communication Skills Desire for Improvement Resilience Social Support  ADL's:  Intact  Cognition: WNL  Sleep:  Okay with medication     Treatment Plan Summary: Medication management   The patient will continue Cymbalta 30 mg daily.Marland Kitchen She'll also continue trazodone and Xanax as prescribed. She refuses to continue in counseling. She'll return to see me in 4 weeks or call sooner if needed   Diannia Ruder, MD 5/16/201712:05 PM

## 2015-06-15 ENCOUNTER — Telehealth (HOSPITAL_COMMUNITY): Payer: Self-pay | Admitting: *Deleted

## 2015-06-15 NOTE — Telephone Encounter (Signed)
Pt called stating she is having trimmer. Per pt, they was reading up on the Xanax and she thinks it's it. Per pt they read that it's not got for severe depression. Pt would like for Dr. Tenny Crawoss to put her on something else other then. Per pt, she can't come into the office. Per pt husband, pt keeps saying she can't she can't. Per pt husband, she's doing nothing but laying on the coach and she's not functioning well. Pt number is [phone]. Per pt husband, something has got to be done.

## 2015-06-15 NOTE — Telephone Encounter (Signed)
Told pt to d/c xanax. Please call in valium 10 mg #90, one tid, 1 refill to [facility] [place]

## 2015-06-19 ENCOUNTER — Telehealth (HOSPITAL_COMMUNITY): Payer: Self-pay | Admitting: *Deleted

## 2015-06-19 MED ORDER — DIAZEPAM 10 MG PO TABS: 10.0000 mg | ORAL_TABLET | Freq: Three times a day (TID) | ORAL | Status: AC

## 2015-06-19 NOTE — Telephone Encounter (Signed)
Per Dr. Tenny Crawoss to call in pt Valium 10 mg TID with quantity of 90 with 1 refill. Pt is aware. [facility]

## 2015-06-19 NOTE — Telephone Encounter (Signed)
Called pt and informed her of what Dr. Tenny Crawoss stated and she verbalized understanding.

## 2015-06-20 ENCOUNTER — Ambulatory Visit (HOSPITAL_COMMUNITY): Payer: Self-pay | Admitting: Psychiatry

## 2015-07-04 ENCOUNTER — Encounter (HOSPITAL_COMMUNITY): Payer: Self-pay | Admitting: Psychiatry

## 2015-07-04 ENCOUNTER — Ambulatory Visit (INDEPENDENT_AMBULATORY_CARE_PROVIDER_SITE_OTHER): Payer: Medicare Other | Admitting: Psychiatry

## 2015-07-04 VITALS — BP 155/62 | HR 60 | Ht 67.0 in | Wt 125.6 lb

## 2015-07-04 DIAGNOSIS — F322 Major depressive disorder, single episode, severe without psychotic features: Secondary | ICD-10-CM | POA: Diagnosis not present

## 2015-07-04 MED ORDER — TRAZODONE HCL 50 MG PO TABS: 50.0000 mg | ORAL_TABLET | Freq: Every evening | ORAL | Status: AC | PRN

## 2015-07-04 MED ORDER — ESCITALOPRAM OXALATE 10 MG PO TABS: 10.0000 mg | ORAL_TABLET | Freq: Every day | ORAL | Status: AC

## 2015-07-04 NOTE — Progress Notes (Signed)
Patient ID: Jasmine West, female   DOB: 05/01/1945, 70 y.o.   MRN: [id number] Patient ID: Jasmine West, female   DOB: December 22, 1945, 70 y.o.   MRN: [id number] Patient ID: Jasmine West, female   DOB: 03/31/1945, 70 y.o.   MRN: [id number]  Psychiatric  Adult follow-up  Patient Identification: Jasmine West MRN:  [id number] Date of Evaluation:  07/04/2015 Referral Source: Behavioral health hospital Chief Complaint:   Chief Complaint    Follow-up; Depression     Visit Diagnosis:    ICD-9-CM ICD-10-CM   1. Severe single current episode of major depressive disorder, without psychotic features (HCC) 296.23 F32.2     History of Present Illness:  This patient is a 70 year old married white female who lives with her husband in [place]. She is a retired Interior and spatial designer.  The patient was referred by the behavioral health hospital where she was inpatient from March 10 through 04/04/2015. The patient was admitted after she was found with a gun by her husband and son threatening to kill herself. 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 She started using it several years ago for back pain. She stated that her doctor "wasn't giving me enough" so she started getting it from friends and off the street. She was using 4-5 tablets a day. She also has a history of anxiety ever since she had valve replacement surgery in 1995 and has been on Xanax chronically. However she states that she does not abuse Xanax.  On February 10 the patient stated that she had had enough of buying the Lortab illegally and stopped it cold [place]. She went through withdrawal symptoms such as nausea anxiety depression unable to function or think agitation and anxiety. She felt so desperate that she became suicidal. While at the hospital she was started on Zoloft which she has since discontinued because she worried it would interact with the digoxin that she is on. She's continued Xanax from her cardiologist and  trazodone from the hospital which has helped her sleep.  Since coming back from the hospital 3 weeks ago which she states that she's not a whole lot better. She still feels depressed and anxious. She can't concentrate or function very well and is not doing her normal task such as housework or paying bills. She is sleeping better with the trazodone. She states that she withdrawn from people as not going to church or interacting with friends. She feels very helpless and hopeless. However she states that she would never kill herself or do this to her family. She is close to her niece and sister and other friends. She is willing to try another antidepressant and I suggested Cymbalta because it would help with both chronic pain and depression. She is on no pain medicine now and states that her back pain comes and goes. She mostly goes to specialist such as cardiologist and doesn't have a primary doctor and I urged her to get one to begin the process of pain management  The patient returns after 3 weeks with her husband. She is now on Valium 10 mg 3 times a day but she actually is only taking 5 mg in the morning and 10 mg at night. She is less anxious. She is sleeping well with the trazodone. However she is still depressed and very obsessional. She paces all the time Make decisions and feels very frightened a leave the house or even have her husband leave. I still think she needs to  be on antidepressant and I suggested a very low dose of Lexapro and she is quite reluctant. I sent it in anyway and encouraged her to pick it up. Both her husband and I encouraged her to come to counseling here and she is also very reluctant.  Associated Signs/Symptoms: Depression Symptoms:  depressed mood, anhedonia, insomnia, psychomotor agitation, psychomotor retardation, fatigue, feelings of worthlessness/guilt, difficulty concentrating, hopelessness, suicidal thoughts without plan, anxiety, panic attacks, loss of  energy/fatigue, disturbed sleep, weight loss, (Hypo) Manic Symptoms:  Irritable Mood, Anxiety Symptoms:  Excessive Worry, Panic Symptoms, Social Anxiety,   Past Psychiatric History: The patient had no prior psychiatric history before the admission last month  Previous Psychotropic Medications: Yes   Substance Abuse History in the last 12 months:  Yes.    Consequences of Substance Abuse: Medical Consequences:  Withdrawal from narcotics cause severe depression and agitation and suicidal ideation. She ended up in the hospital at behavioral health Withdrawal Symptoms:   Nausea  Past Medical History:  Past Medical History  Diagnosis Date  . Brain aneurysm 10/20/13    3 on lt side and 1 on the right, most recent Oct 1  . Thyroid disease   . Hypertension   . COPD (chronic obstructive pulmonary disease) (HCC)   . Asthma   . Chronic back pain   . Sciatica   . Hyponatremia   . Stroke [facility])     seen on CT scan in 2015  . Episodic lightheadedness     since cerebral aneurysm repair 10/2013  . Atrial fibrillation (HCC)   . S/P mitral valve replacement with metallic valve 01/08/2015  . S/P aortic valve replacement with metallic valve 01/09/2015    [facility] 1995  . 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 Left coronary system traverses lateral to pulmonary artery.   . Depression     Past Surgical History  Procedure Laterality Date  . Brain surgery      aneurysm stent placed 2015  . Cardiac surgery    . Cholecystectomy    . Appendectomy    . Cataract extraction    . Cardiac catheterization N/A 01/09/2015    Procedure: Left Heart Cath and Coronary Angiography;  Surgeon: Lennette Bihari, MD;  Location: [facility] INVASIVE CV LAB;  Service: Cardiovascular;  Laterality: N/A;    Family Psychiatric History: none  Family History:  Family History  Problem Relation Age of Onset  . Cancer Sister   . Cancer Brother   . Kidney failure  Father     Social History:   Social History   Social History  . Marital Status: Married    Spouse Name: N/A  . Number of Children: N/A  . Years of Education: N/A   Social History Main Topics  . Smoking status: Former Smoker -- 1.00 packs/day for 40 years    Types: Cigarettes    Quit date: 01/07/2015  . Smokeless tobacco: Never Used     Comment: 04-27-15 per pt she use E-Vap. and stopped Cigarettes on 01-13-15.  Marland Kitchen Alcohol Use: No     Comment: History of using hydrocodone 4 piils 10mg  eacjh  per day, 04-27-15 per pt no  . Drug Use: Yes    Special: Hydrocodone     Comment: 04-27-2015 per pt not now and stopped Hydrocodone Feb 2017  . Sexual Activity: Yes    Birth Control/ Protection: Post-menopausal   Other Topics Concern  . None  Social History Narrative    Additional Social History: The [place] with 2 sisters and 1 brother. She states that she had a very good childhood. She did not finish high school but later got a GED. She has one grown son but no grandchildren. 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 She had to stop working due to many medical problems such as having heart valve replacements as well as forebrain stents put in. She states that her husband is very supportive but sometimes gets irritable with her  Allergies:   Allergies  Allergen Reactions  . Sulfa Antibiotics Photosensitivity    Yellow eyes    Metabolic Disorder Labs: Lab Results  Component Value Date   HGBA1C 5.6 03/31/2015   MPG 114 03/31/2015   No results found for: PROLACTIN No results found for: CHOL, TRIG, HDL, CHOLHDL, VLDL, LDLCALC   Current Medications: Current Outpatient Prescriptions  Medication Sig Dispense Refill  . acetaminophen (TYLENOL) 500 MG tablet Take 1,000 mg by mouth every 6 (six) hours as needed for moderate pain.    Marland Kitchen albuterol (PROVENTIL HFA;VENTOLIN HFA) 108 (90 Base) MCG/ACT inhaler Inhale 2 puffs into the lungs  every 6 (six) hours as needed for wheezing or shortness of breath.    Marland Kitchen albuterol (PROVENTIL) (2.5 MG/3ML) 0.083% nebulizer solution Take 3 mLs (2.5 mg total) by nebulization every 6 (six) hours as needed for wheezing or shortness of breath. 75 mL 12  . COUMADIN 5 MG tablet (Follow-up with yr MD on Monday for blood work), Take 7.5 mg daily: For blood blood thinner. (Patient taking differently: Take 5-7.5 mg by mouth daily at 6 PM. TAKES  ON M,W,F TAKES 7.5MG  ALL OTHER DAYS) 10 tablet 0  . diazepam (VALIUM) 10 MG tablet Take 1 tablet (10 mg total) by mouth 3 (three) times daily. 90 tablet 1  . digoxin (LANOXIN) 0.25 MG tablet Take 1 tablet (0.25 mg total) by mouth every morning. For heart problems (Patient taking differently: Take 0.25 mg by mouth every evening. For heart problems)    . fluticasone (FLONASE) 50 MCG/ACT nasal spray Place 2 sprays into both nostrils every evening. For allergies  2  . levothyroxine (SYNTHROID, LEVOTHROID) 50 MCG tablet Take 1 tablet (50 mcg total) by mouth at bedtime. For low functioning thyroid gland (Patient taking differently: Take 50 mcg by mouth daily before breakfast. For low functioning thyroid gland)    . losartan (COZAAR) 50 MG tablet Take 1 tablet (50 mg total) by mouth every evening. For high blood pressure    . metoprolol tartrate (LOPRESSOR) 25 MG tablet Take 0.5 tablets (12.5 mg total) by mouth 2 (two) times daily. For high blood pressure (Patient taking differently: Take 12.5 mg by mouth every evening. For high blood pressure)  9  . montelukast (SINGULAIR) 10 MG tablet Take 10 mg by mouth at bedtime.  2  . pantoprazole (PROTONIX) 20 MG tablet Take 1 tablet (40 mg) daily: For acid reflux (Patient taking differently: Take 40 mg by mouth daily as needed for heartburn. Take 1 tablet (40 mg) daily: For acid reflux) 30 tablet 0  . traZODone (DESYREL) 50 MG tablet Take 1 tablet (50 mg total) by mouth at bedtime and may repeat dose one time if needed. For sleep 60  tablet 2  . escitalopram (LEXAPRO) 10 MG tablet Take 1 tablet (10 mg total) by mouth daily. 30 tablet 2  . umeclidinium bromide (INCRUSE ELLIPTA) 62.5 MCG/INH AEPB  Inhale 1 puff into the lungs at bedtime. For COPD (Patient not taking: Reported on 07/04/2015)     No current facility-administered medications for this visit.    Neurologic: Headache: No Seizure: No Paresthesias:No  Musculoskeletal: Strength & Muscle Tone: within normal limits Gait & Station: normal Patient leans: N/A  Psychiatric Specialty Exam: Review of Systems  Constitutional: Positive for weight loss and malaise/fatigue.  Musculoskeletal: Positive for back pain.  Psychiatric/Behavioral: Positive for depression, suicidal ideas and substance abuse. The patient is nervous/anxious and has insomnia.     Blood pressure 155/62, pulse 60, height 5\' 7"  (1.702 m), weight 125 lb 9.6 oz (56.972 kg).Body mass index is 19.67 kg/(m^2).  General Appearance: Casual, Neat and Well Groomed  Eye Contact:  Good  Speech:  Clear and Coherent  Volume:  Normal  Mood:  Anxious   Affect:  Constricted and worried   Thought Process:  Goal Directed  Orientation:  Full (Time, Place, and Person)  Thought Content:  Rumination  Suicidal Thoughts:  No   Homicidal Thoughts:  No  Memory:  Immediate;   Good Recent;   Fair Remote;   Fair  Judgement:  Poor  Insight:  Lacking  Psychomotor Activity:  Decreased  Concentration:  Poor  Recall:  Fair  Fund of Knowledge:Good  Language: Good  Akathisia:  No  Handed:  Right  AIMS (if indicated):    Assets:  Communication Skills Desire for Improvement Resilience Social Support  ADL's:  Intact  Cognition: WNL  Sleep:  Okay with medication     Treatment Plan Summary: Medication management   The patient Will start Lexapro 10 mg daily. She'll also continue trazodone and Valium 5 mg every morning and 10 mg bedtime She was strongly urged to start counseling. She'll return to see me in 4 weeks or  call sooner if needed   Diannia RuderOSS, DEBORAH, MD 6/14/20173:31 PM

## 2015-07-21 DEATH — deceased

## 2015-07-23 ENCOUNTER — Telehealth (HOSPITAL_COMMUNITY): Payer: Self-pay | Admitting: *Deleted

## 2015-07-23 NOTE — Telephone Encounter (Signed)
Pt husband called to let Dr. Tenny Crawoss know that pt passed away on Friday June 30th and her funeral is today 07-23-15. Per pt husband, he just wanted to let Dr. Tenny Crawoss know that he appreciated all the help she did for pt, Pt husband stated from what he knows, death was due to other health problems.

## 2015-07-23 NOTE — Telephone Encounter (Signed)
phone call from patient's husband, she passed away on Friday.

## 2015-07-25 ENCOUNTER — Ambulatory Visit (HOSPITAL_COMMUNITY): Payer: Self-pay | Admitting: Psychiatry

## 2015-08-06 ENCOUNTER — Ambulatory Visit (HOSPITAL_COMMUNITY): Payer: Self-pay | Admitting: Psychiatry

## 2016-09-28 IMAGING — CT CT HEAD W/O CM
1 series · 15 of 30 positions shown, 19 images · non-contrast
Comparison: Head CT 11/11/2013.

CLINICAL DATA: Headache with left leg weakness today. On
anticoagulation. History of brain aneurysm. Code stroke. Initial
encounter.

EXAM:
CT HEAD WITHOUT CONTRAST
TECHNIQUE: Contiguous axial images were obtained from the base of the skull
through the vertex without intravenous contrast.

[Series 2: headtrauma 4.8 h37s · axial · 0.43mm/px · z∈[+165,+294]mm · 15 of 30 slices shown, 19 images]
[im 2/30  brain]
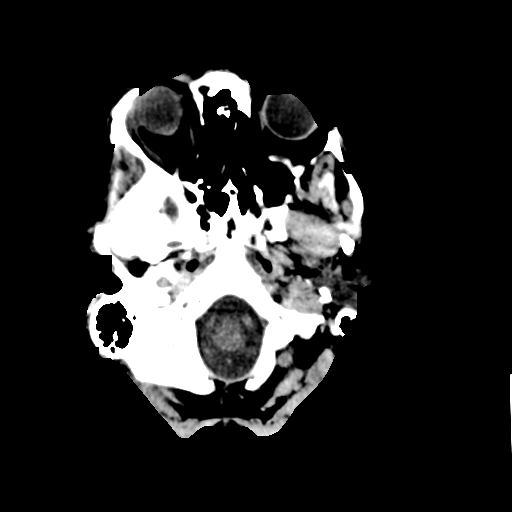
[im 2/30  bone]
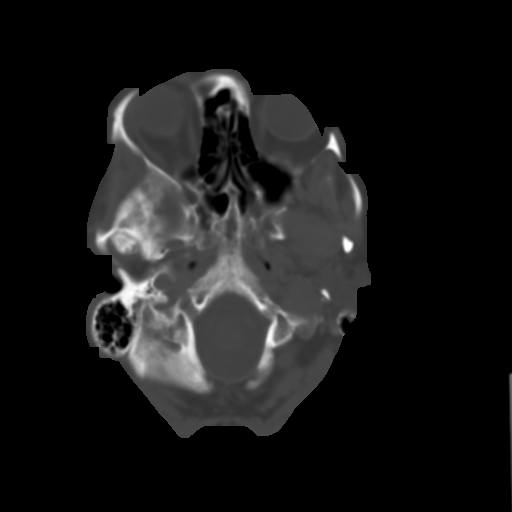
[im 4/30  brain]
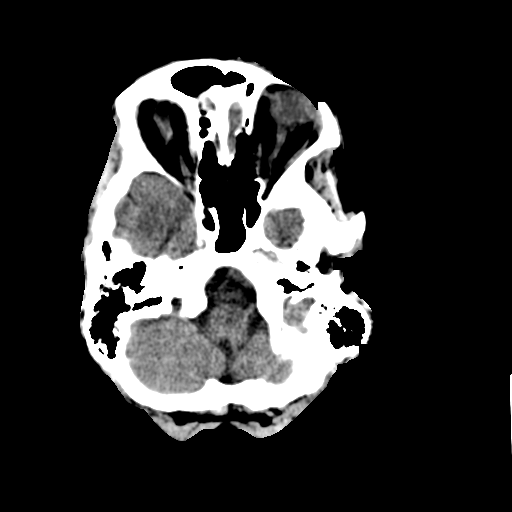
[im 6/30  brain]
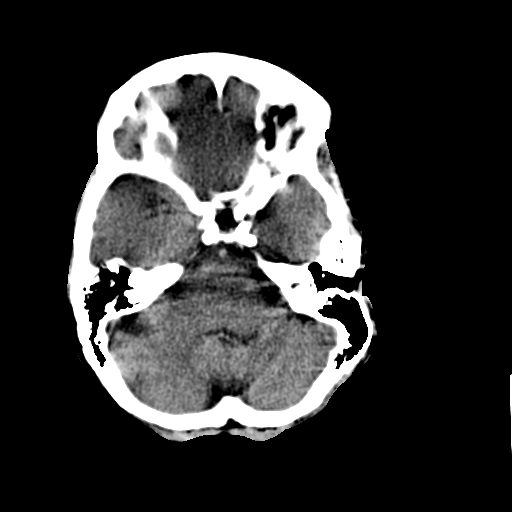
[im 8/30  brain]
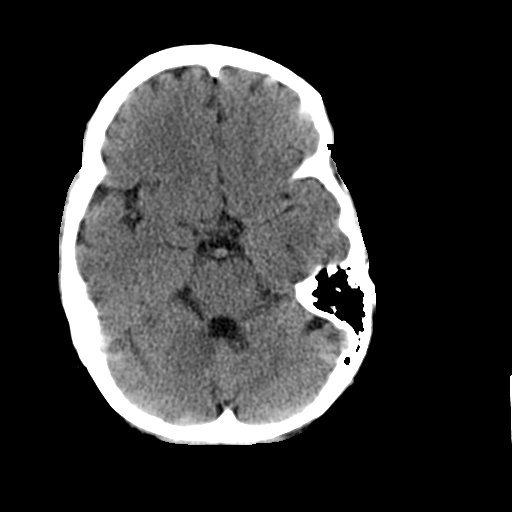
[im 10/30  brain]
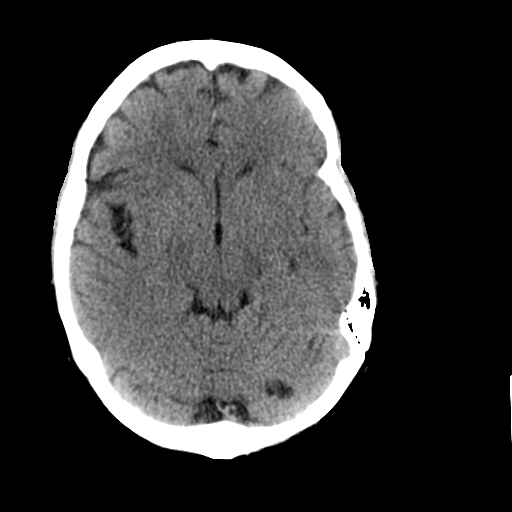
[im 10/30  bone]
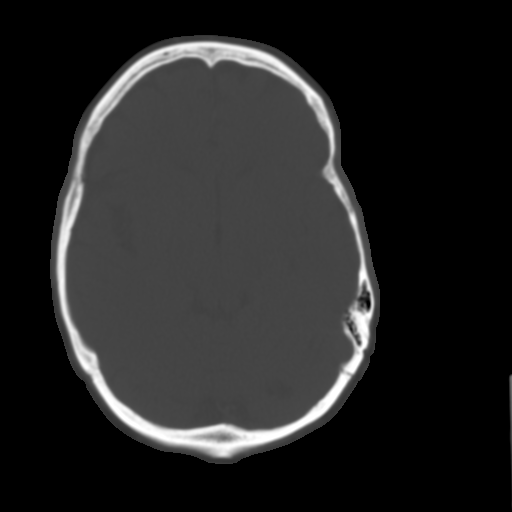
[im 12/30  brain]
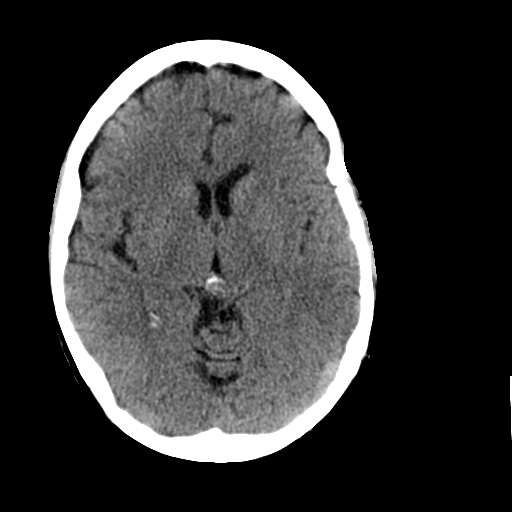
[im 14/30  brain]
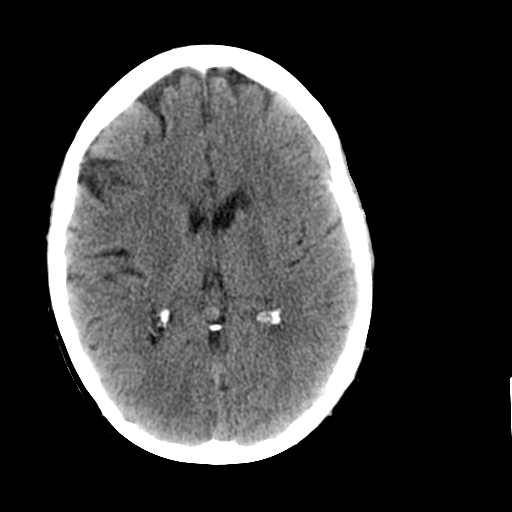
[im 16/30  brain]
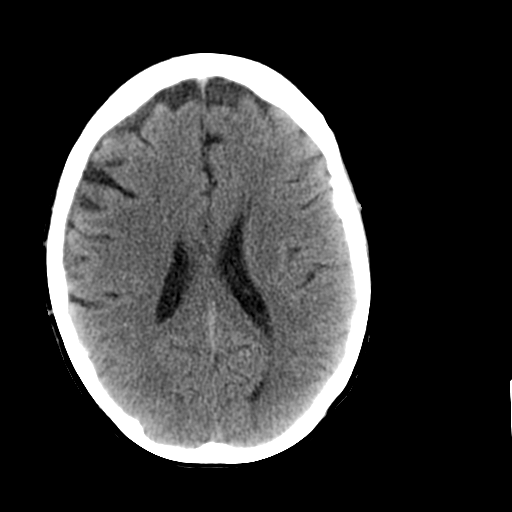
[im 17/30  brain]
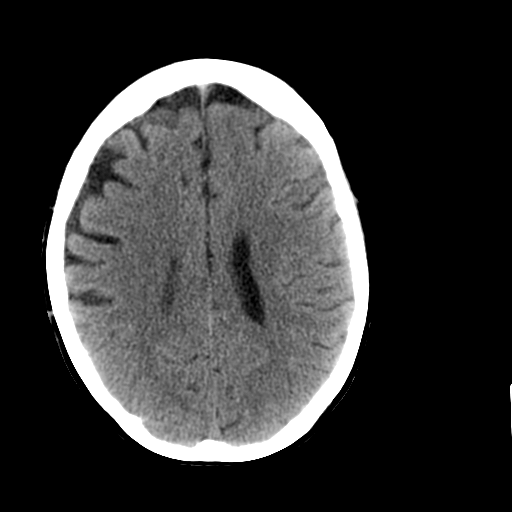
[im 17/30  bone]
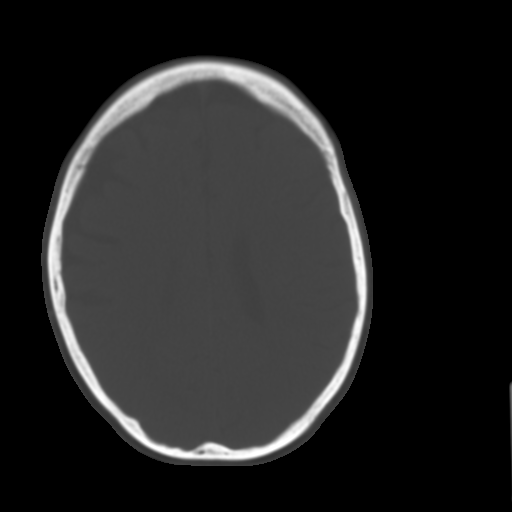
[im 19/30  brain]
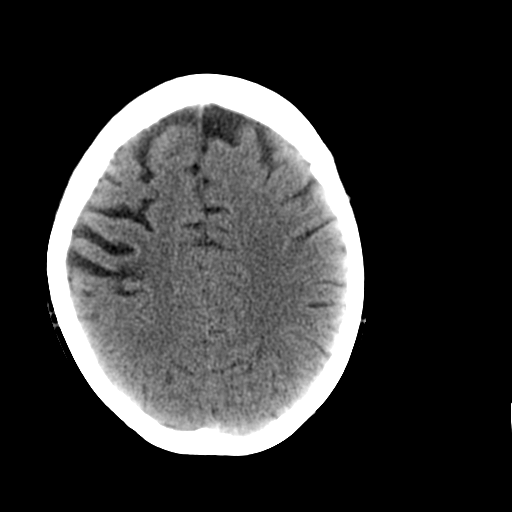
[im 21/30  brain]
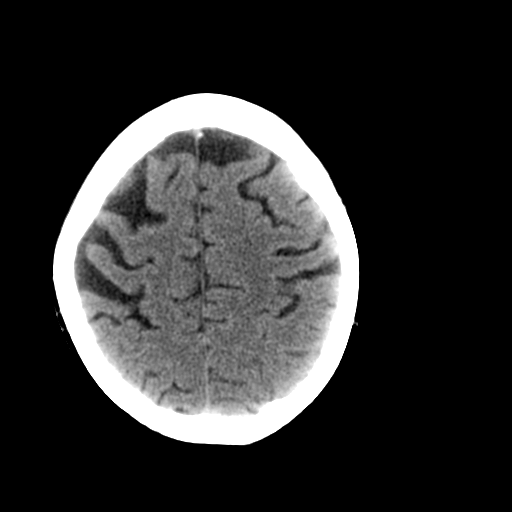
[im 23/30  brain]
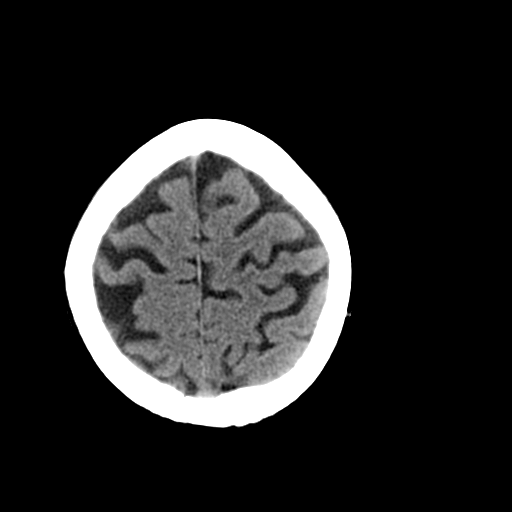
[im 25/30  brain]
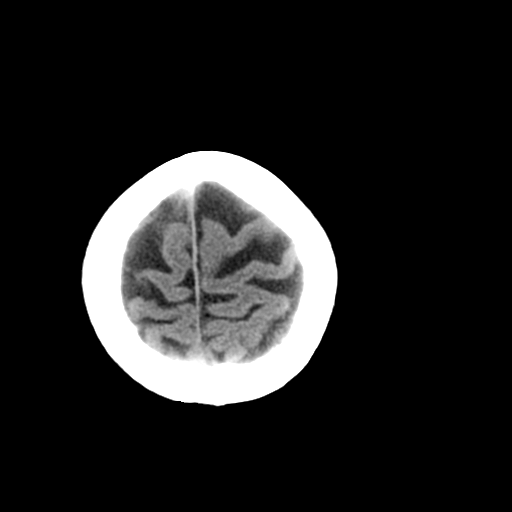
[im 25/30  bone]
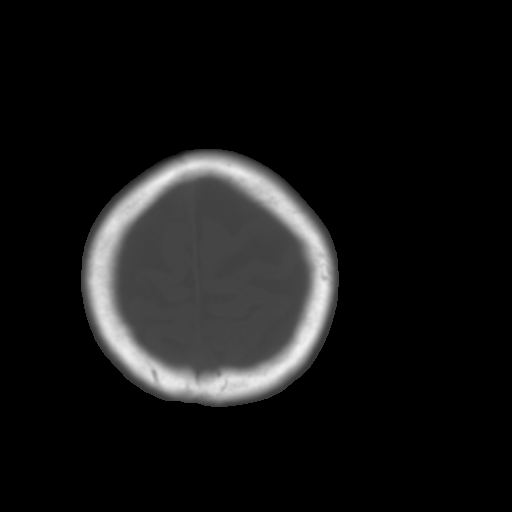
[im 27/30  brain]
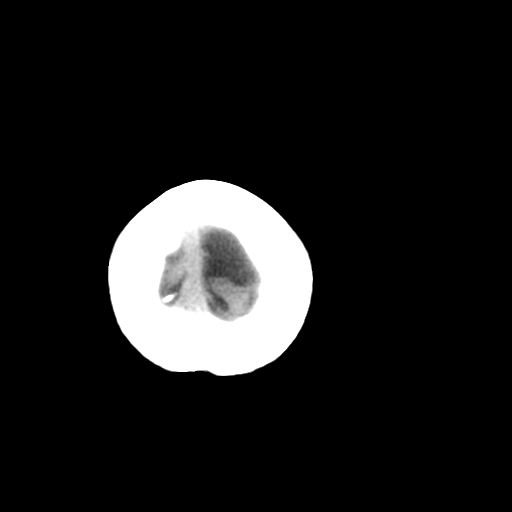
[im 29/30  brain]
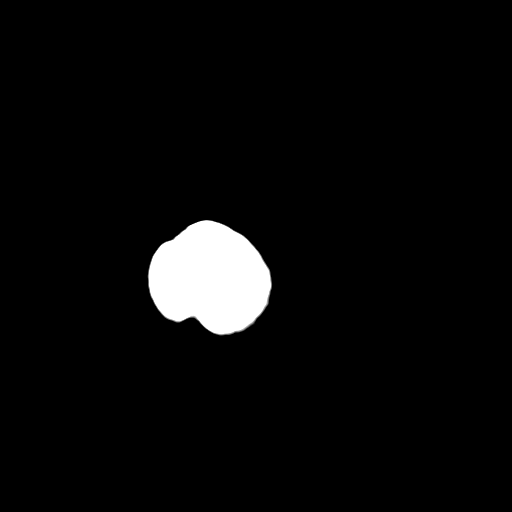

[15 of 30 positions shown; findings below may reference images not displayed]

FINDINGS: There is no evidence of acute intracranial hemorrhage, mass lesion,
brain edema or extra-axial fluid collection. The ventricles and
subarachnoid spaces are appropriately sized for age. There is no CT
evidence of acute cortical infarction. There is chronic subcortical
encephalomalacia in the posterior right frontal lobe. There is an
old left cerebellar lacunar infarct. There is also an old lacunar
infarct anteriorly in the left caudate body. Intracranial vascular
calcifications are noted.

The visualized paranasal sinuses, mastoid air cells and middle ears
are clear. The calvarium is intact. TMJ degenerative changes are
present bilaterally.
IMPRESSION: Stable head CT demonstrating chronic small vessel ischemic changes,
but no CT evidence of acute stroke or acute hemorrhage.

These results were called by telephone at the time of interpretation
on 01/07/2015 at [DATE] to Dr. HABSIM HARTLEY , who verbally
acknowledged these results.

## 2016-09-30 IMAGING — CT CT ABD-PELV W/ CM
1 series · 14 of 28 positions shown, 18 images · IV contrast (APPLIED)
Comparison: None.

CLINICAL DATA: 69-year-old female with dysphagia.

EXAM:
CT CHEST, ABDOMEN, AND PELVIS WITH CONTRAST
TECHNIQUE: Multidetector CT imaging of the chest, abdomen and pelvis was
performed following the standard protocol during bolus
administration of intravenous contrast.
CONTRAST:  80mL OMNIPAQUE IOHEXOL 300 MG/ML  SOLN

[Series 2: delay 5.0 i31f 1 · axial · delayed · 0.68mm/px · z∈[-437,-307]mm · 14 of 30 slices shown, 18 images]
[im 2/30  mediastinal]
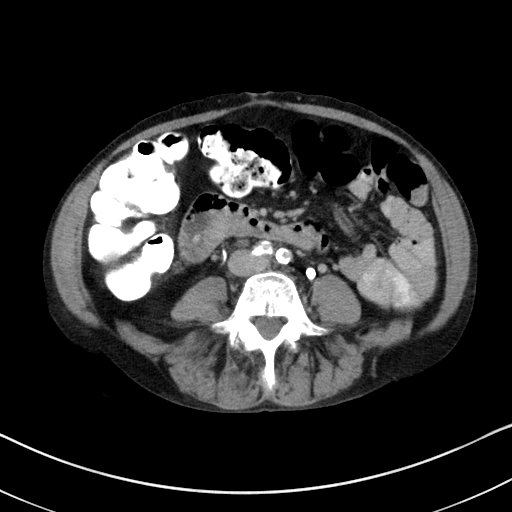
[im 2/30  lung]
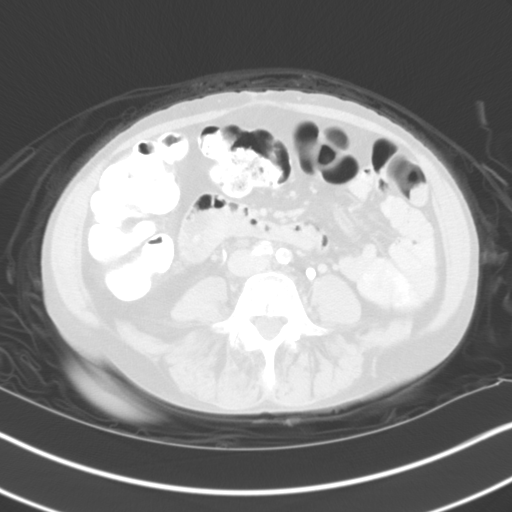
[im 4/30  lung]
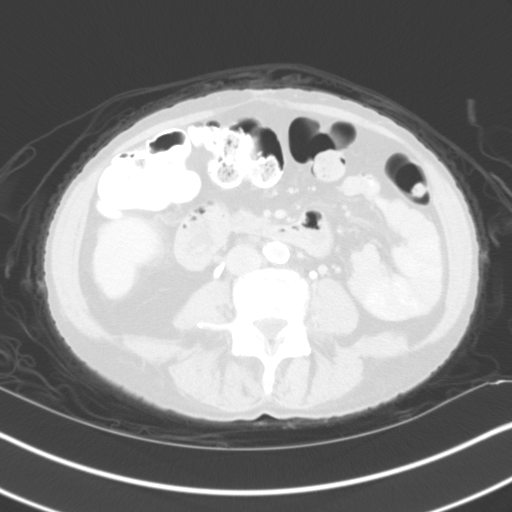
[im 6/30  lung]
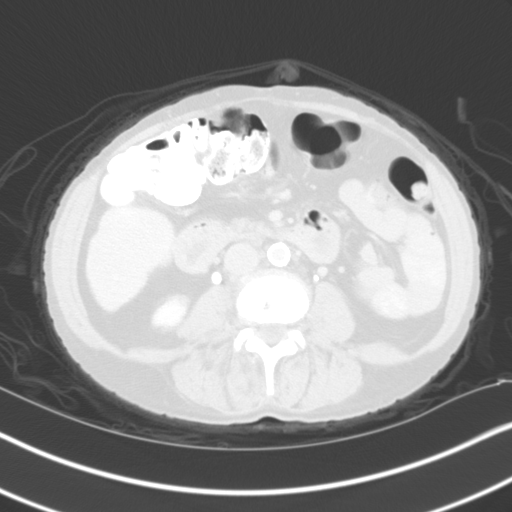
[im 8/30  lung]
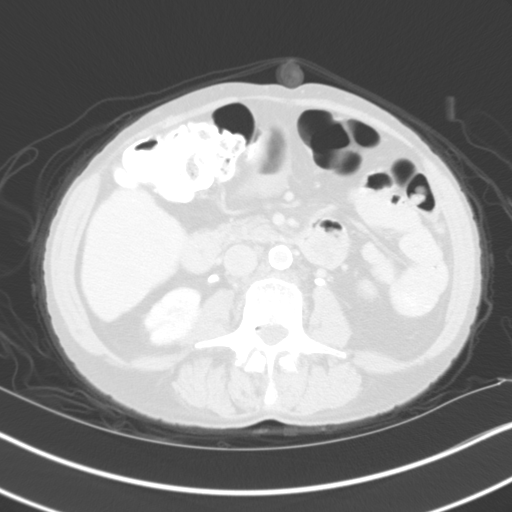
[im 10/30  mediastinal]
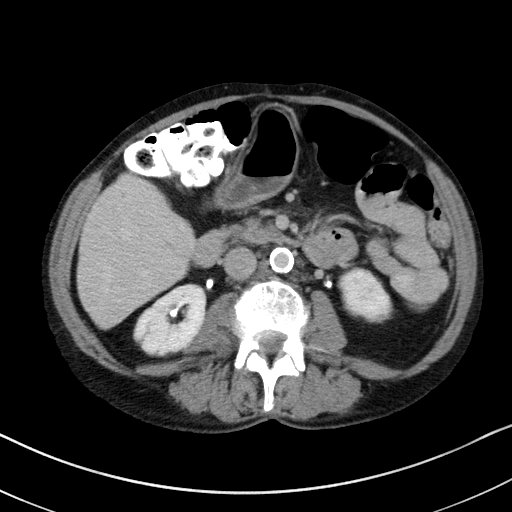
[im 10/30  lung]
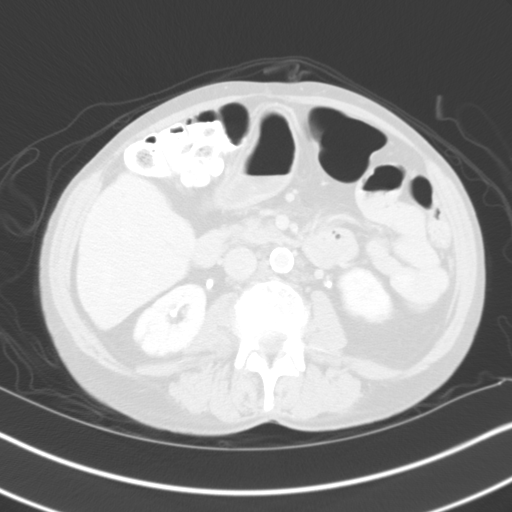
[im 12/30  lung]
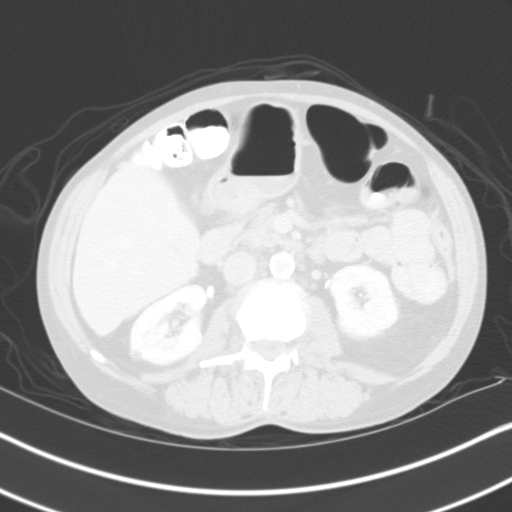
[im 14/30  lung]
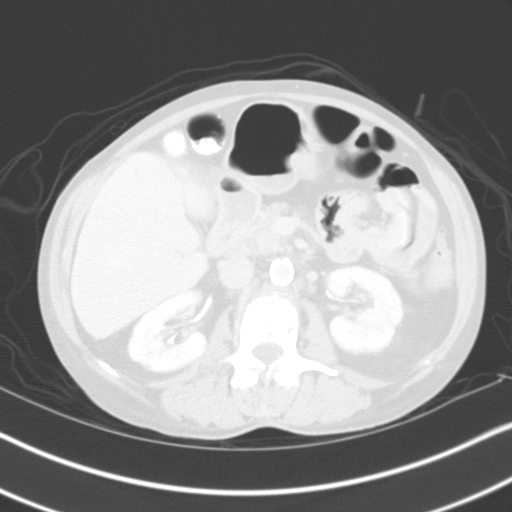
[im 16/30  lung]
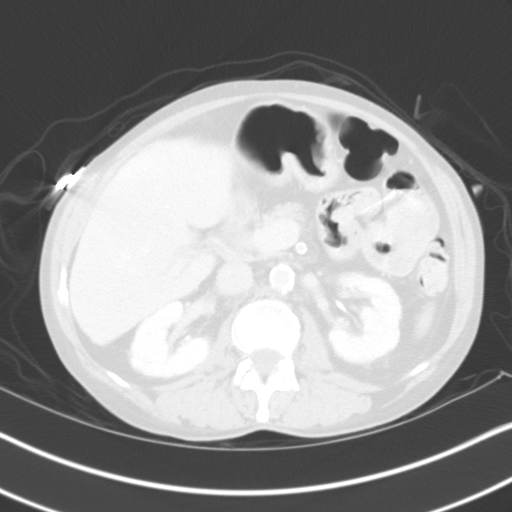
[im 18/30  mediastinal]
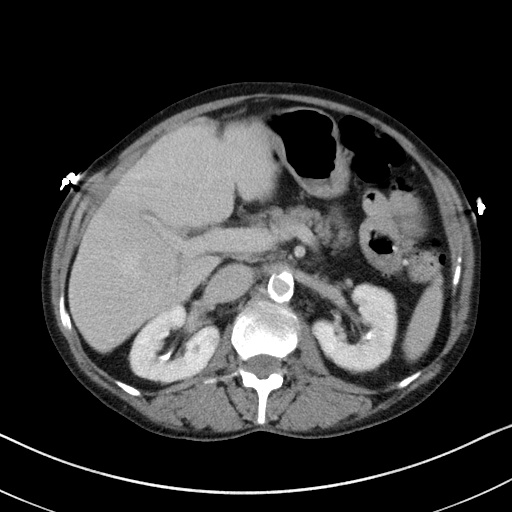
[im 18/30  lung]
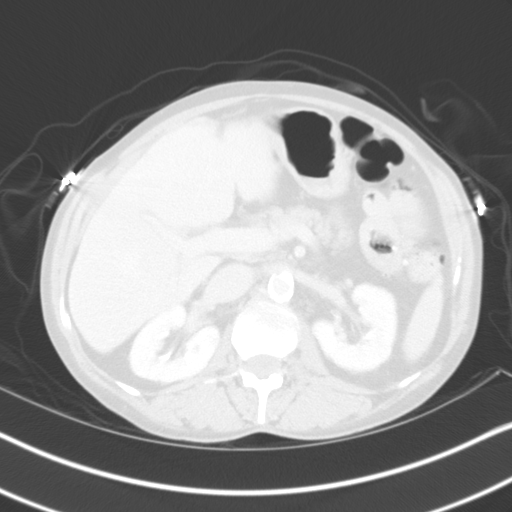
[im 20/30  lung]
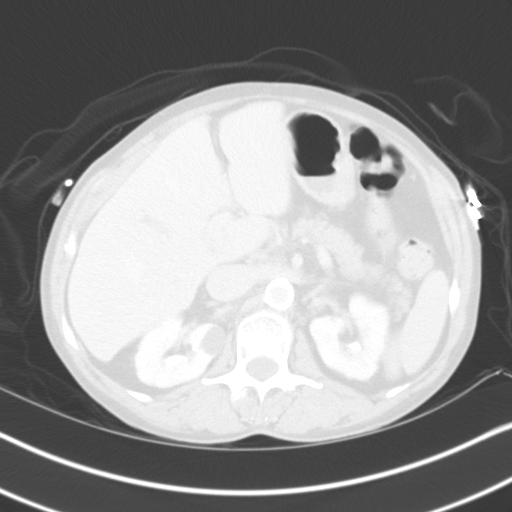
[im 22/30  lung]
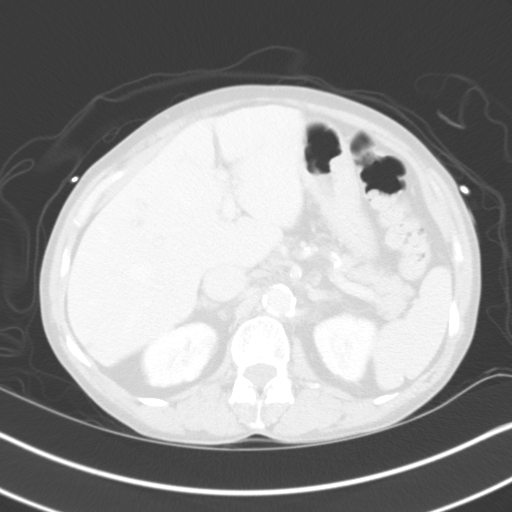
[im 24/30  lung]
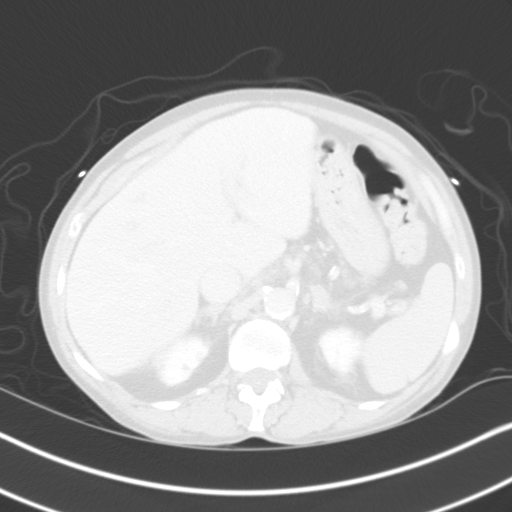
[im 26/30  mediastinal]
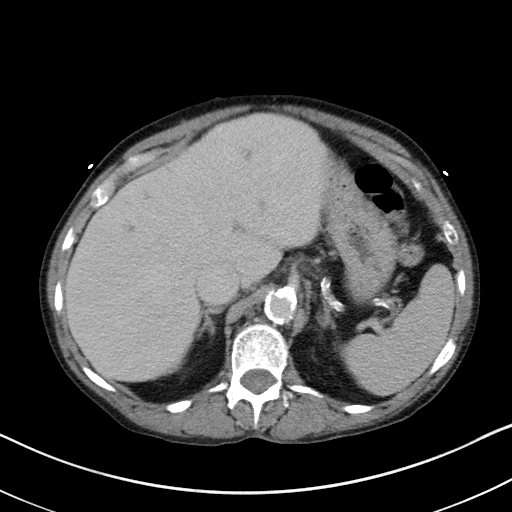
[im 26/30  lung]
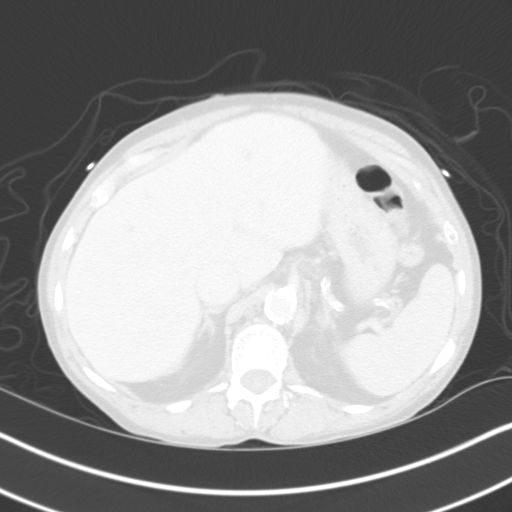
[im 28/30  lung]
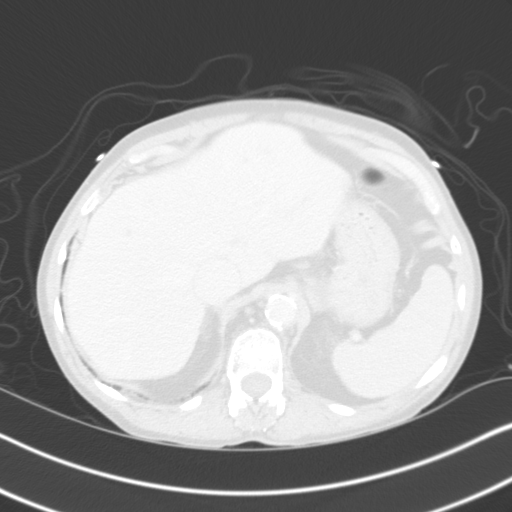

[14 of 28 positions shown; findings below may reference images not displayed]

FINDINGS: There is emphysematous changes of the lungs. No focal consolidation,
pleural effusion, or pneumothorax. The central airways are patent.

There is atherosclerotic calcification of the thoracic aorta. No
aneurysm or dissection. The central pulmonary arteries appear
patent. There is severe cardiomegaly. Mechanical mitral and aortic
valves. No pericardial effusion. A slightly enlarged left hilar
lymph node measuring 1.5 cm in short axis. Mildly prominent
subcarinal lymph nodes noted. The visualized esophagus is grossly
unremarkable. The thyroid gland is not well visualized.

There is no axillary adenopathy. The chest wall soft tissues appear
unremarkable.

No intra-abdominal free air or free fluid. There is minimal
irregularity of the hepatic contour. The gallbladder is not
visualized, likely surgically absent. Mild periportal edema. The
pancreas, spleen, and adrenal glands appear unremarkable. Bilateral
renal cortical irregularity, likely related to recurrent infarct and
scarring. A 1.7 cm right renal cyst. Subcentimeter left renal
hypodense lesion is too small to characterize. There is no
hydronephrosis on either side. The visualized ureters and urinary
bladder appear unremarkable. Small uterine calcification may
represent calcified fibroids. Ultrasound may provide better
evaluation of the pelvic structures.

There is mild apparent thickening of the proximal duodenum, likely
related to underdistention. Duodenitis is less likely. Clinical
correlation is recommended. There is a 1.8 cm nodular density along
the wall of the proximal duodenum (coronal series 5, image 33 and
axial series 2 image 76). This area of the duodenum is not evaluated
due to is non opacification with oral contrast. This may represent a
focal area of thickening or a duodenal diverticulum. Other
etiologies are not excluded. Endoscopy may provide better evaluation
if clinically indicated. There is sigmoid diverticulosis without
active inflammation. Moderate stool noted throughout the colon. No
evidence of bowel obstruction. Appendectomy.

Advanced aortoiliac atherosclerotic disease. The origins of the
celiac axis, SMA, IMA as well as the origins of the renal arteries
remain patent. No portal venous gas identified. There is retrograde
flow of contrast into the IVC and hepatic veins compatible with a
degree of right heart dysfunction. There is no adenopathy in the
abdomen pelvis.

There is multilevel degenerative changes of the spine. No acute
fracture. Grade 1 L2-L3 retrolisthesis. Median sternotomy wires.
IMPRESSION: Emphysema.  No focal consolidation or pneumothorax.

Severe cardiomegaly with evidence of right cardiac dysfunction.

No evidence of bowel obstruction. Apparent mild thickening of the
duodenal wall, likely related to underdistention. Duodenitis is not
excluded. Clinical correlation is recommended.

Focal nodular thickening along the duodenal wall may represent a
diverticulum. Other etiologies are not excluded. Endoscopy may
provide better evaluation.
